# Patient Record
Sex: Female | Born: 1935 | Race: White | Hispanic: No | Smoking: Never smoker
Health system: Southern US, Community
[De-identification: ages and names within clinical notes are randomized; demographics above are authoritative.]

## PROBLEM LIST (undated history)

## (undated) DIAGNOSIS — Z87442 Personal history of urinary calculi: Secondary | ICD-10-CM

## (undated) DIAGNOSIS — N3000 Acute cystitis without hematuria: Secondary | ICD-10-CM

## (undated) DIAGNOSIS — H409 Unspecified glaucoma: Secondary | ICD-10-CM

## (undated) DIAGNOSIS — Z9889 Other specified postprocedural states: Secondary | ICD-10-CM

## (undated) DIAGNOSIS — R0600 Dyspnea, unspecified: Secondary | ICD-10-CM

## (undated) DIAGNOSIS — I499 Cardiac arrhythmia, unspecified: Secondary | ICD-10-CM

## (undated) DIAGNOSIS — C801 Malignant (primary) neoplasm, unspecified: Secondary | ICD-10-CM

## (undated) DIAGNOSIS — R2681 Unsteadiness on feet: Secondary | ICD-10-CM

## (undated) DIAGNOSIS — F32A Depression, unspecified: Secondary | ICD-10-CM

## (undated) DIAGNOSIS — I10 Essential (primary) hypertension: Secondary | ICD-10-CM

## (undated) DIAGNOSIS — Q6211 Congenital occlusion of ureteropelvic junction: Secondary | ICD-10-CM

## (undated) DIAGNOSIS — N393 Stress incontinence (female) (male): Secondary | ICD-10-CM

## (undated) DIAGNOSIS — H3530 Unspecified macular degeneration: Secondary | ICD-10-CM

## (undated) DIAGNOSIS — N390 Urinary tract infection, site not specified: Secondary | ICD-10-CM

## (undated) DIAGNOSIS — Q6239 Other obstructive defects of renal pelvis and ureter: Secondary | ICD-10-CM

## (undated) DIAGNOSIS — N1330 Unspecified hydronephrosis: Secondary | ICD-10-CM

## (undated) DIAGNOSIS — R339 Retention of urine, unspecified: Secondary | ICD-10-CM

## (undated) DIAGNOSIS — R42 Dizziness and giddiness: Secondary | ICD-10-CM

## (undated) DIAGNOSIS — F419 Anxiety disorder, unspecified: Secondary | ICD-10-CM

## (undated) DIAGNOSIS — N281 Cyst of kidney, acquired: Secondary | ICD-10-CM

## (undated) DIAGNOSIS — R112 Nausea with vomiting, unspecified: Secondary | ICD-10-CM

## (undated) HISTORY — PX: EYE SURGERY: SHX253

## (undated) HISTORY — PX: OTHER SURGICAL HISTORY: SHX169

## (undated) HISTORY — DX: Urinary tract infection, site not specified: N39.0

## (undated) HISTORY — PX: ABDOMINAL HYSTERECTOMY: SHX81

## (undated) HISTORY — DX: Acute cystitis without hematuria: N30.00

## (undated) HISTORY — DX: Essential (primary) hypertension: I10

## (undated) HISTORY — DX: Cyst of kidney, acquired: N28.1

## (undated) HISTORY — DX: Congenital occlusion of ureteropelvic junction: Q62.11

## (undated) HISTORY — DX: Anxiety disorder, unspecified: F41.9

## (undated) HISTORY — DX: Stress incontinence (female) (male): N39.3

## (undated) HISTORY — DX: Unspecified hydronephrosis: N13.30

## (undated) HISTORY — DX: Unspecified glaucoma: H40.9

## (undated) HISTORY — DX: Other obstructive defects of renal pelvis and ureter: Q62.39

## (undated) HISTORY — DX: Retention of urine, unspecified: R33.9

---

## 2005-02-15 ENCOUNTER — Emergency Department: Payer: Self-pay | Admitting: Emergency Medicine

## 2005-04-17 ENCOUNTER — Ambulatory Visit: Payer: Self-pay | Admitting: Unknown Physician Specialty

## 2005-05-29 ENCOUNTER — Ambulatory Visit: Payer: Self-pay | Admitting: Family Medicine

## 2005-09-24 ENCOUNTER — Ambulatory Visit: Payer: Self-pay | Admitting: Family Medicine

## 2005-09-26 ENCOUNTER — Other Ambulatory Visit: Payer: Self-pay

## 2005-09-26 ENCOUNTER — Inpatient Hospital Stay: Payer: Self-pay | Admitting: Internal Medicine

## 2006-06-17 ENCOUNTER — Ambulatory Visit: Payer: Self-pay | Admitting: Family Medicine

## 2006-07-01 ENCOUNTER — Ambulatory Visit: Payer: Self-pay | Admitting: Urology

## 2007-06-23 ENCOUNTER — Ambulatory Visit: Payer: Self-pay | Admitting: Family Medicine

## 2007-08-20 ENCOUNTER — Ambulatory Visit: Payer: Self-pay | Admitting: Urology

## 2008-04-21 ENCOUNTER — Ambulatory Visit: Payer: Self-pay | Admitting: General Surgery

## 2008-04-21 ENCOUNTER — Other Ambulatory Visit: Payer: Self-pay

## 2008-05-04 HISTORY — PX: HERNIA REPAIR: SHX51

## 2008-05-10 ENCOUNTER — Ambulatory Visit: Payer: Self-pay | Admitting: General Surgery

## 2008-07-05 ENCOUNTER — Ambulatory Visit: Payer: Self-pay | Admitting: Family Medicine

## 2008-08-17 ENCOUNTER — Ambulatory Visit: Payer: Self-pay | Admitting: Urology

## 2009-07-12 ENCOUNTER — Inpatient Hospital Stay: Payer: Self-pay | Admitting: Internal Medicine

## 2009-08-02 ENCOUNTER — Ambulatory Visit: Payer: Self-pay | Admitting: Urology

## 2009-08-07 ENCOUNTER — Ambulatory Visit: Payer: Self-pay | Admitting: Urology

## 2009-08-10 ENCOUNTER — Ambulatory Visit: Payer: Self-pay | Admitting: Family Medicine

## 2009-08-22 ENCOUNTER — Ambulatory Visit: Payer: Self-pay | Admitting: Urology

## 2009-08-23 ENCOUNTER — Emergency Department: Payer: Self-pay | Admitting: Emergency Medicine

## 2009-09-20 ENCOUNTER — Ambulatory Visit: Payer: Self-pay | Admitting: Urology

## 2009-12-06 ENCOUNTER — Ambulatory Visit: Payer: Self-pay | Admitting: Urology

## 2009-12-19 ENCOUNTER — Ambulatory Visit: Payer: Self-pay | Admitting: Urology

## 2010-09-20 ENCOUNTER — Ambulatory Visit: Payer: Self-pay | Admitting: Family Medicine

## 2010-12-12 ENCOUNTER — Ambulatory Visit: Payer: Self-pay | Admitting: Urology

## 2011-01-01 ENCOUNTER — Ambulatory Visit: Payer: Self-pay | Admitting: Urology

## 2011-10-25 ENCOUNTER — Ambulatory Visit: Payer: Self-pay | Admitting: Family Medicine

## 2011-11-29 ENCOUNTER — Ambulatory Visit: Payer: Self-pay | Admitting: Urology

## 2011-12-10 ENCOUNTER — Ambulatory Visit: Payer: Self-pay | Admitting: Urology

## 2012-04-13 ENCOUNTER — Emergency Department: Payer: Self-pay | Admitting: Emergency Medicine

## 2012-04-13 LAB — COMPREHENSIVE METABOLIC PANEL
Alkaline Phosphatase: 111 U/L (ref 50–136)
Anion Gap: 8 (ref 7–16)
Bilirubin,Total: 0.4 mg/dL (ref 0.2–1.0)
Calcium, Total: 9.5 mg/dL (ref 8.5–10.1)
Chloride: 106 mmol/L (ref 98–107)
Co2: 26 mmol/L (ref 21–32)
Creatinine: 1.4 mg/dL — ABNORMAL HIGH (ref 0.60–1.30)
EGFR (Non-African Amer.): 34 — ABNORMAL LOW
Glucose: 91 mg/dL (ref 65–99)
Potassium: 5.2 mmol/L — ABNORMAL HIGH (ref 3.5–5.1)
SGOT(AST): 23 U/L (ref 15–37)
SGPT (ALT): 18 U/L
Sodium: 140 mmol/L (ref 136–145)
Total Protein: 7.2 g/dL (ref 6.4–8.2)

## 2012-04-13 LAB — CBC
HCT: 38 % (ref 35.0–47.0)
MCH: 32.5 pg (ref 26.0–34.0)
MCV: 97 fL (ref 80–100)
Platelet: 279 10*3/uL (ref 150–440)
RBC: 3.92 10*6/uL (ref 3.80–5.20)
RDW: 12.8 % (ref 11.5–14.5)
WBC: 10.6 10*3/uL (ref 3.6–11.0)

## 2012-04-13 LAB — URINALYSIS, COMPLETE
Bilirubin,UR: NEGATIVE
Glucose,UR: NEGATIVE mg/dL (ref 0–75)
Specific Gravity: 1.004 (ref 1.003–1.030)

## 2012-11-13 ENCOUNTER — Ambulatory Visit: Payer: Self-pay | Admitting: Family Medicine

## 2012-11-24 ENCOUNTER — Ambulatory Visit: Payer: Self-pay | Admitting: Urology

## 2012-11-24 LAB — BASIC METABOLIC PANEL
Anion Gap: 7 (ref 7–16)
BUN: 29 mg/dL — ABNORMAL HIGH (ref 7–18)
Calcium, Total: 9.3 mg/dL (ref 8.5–10.1)
Chloride: 104 mmol/L (ref 98–107)
Co2: 26 mmol/L (ref 21–32)
EGFR (African American): 42 — ABNORMAL LOW
Glucose: 96 mg/dL (ref 65–99)
Osmolality: 280 (ref 275–301)
Potassium: 4.8 mmol/L (ref 3.5–5.1)

## 2012-11-24 LAB — CBC WITH DIFFERENTIAL/PLATELET
Basophil %: 0.5 %
HGB: 13.1 g/dL (ref 12.0–16.0)
MCH: 32.5 pg (ref 26.0–34.0)
MCHC: 34.4 g/dL (ref 32.0–36.0)
Monocyte #: 0.5 x10 3/mm (ref 0.2–0.9)
Neutrophil %: 58.8 %
Platelet: 278 10*3/uL (ref 150–440)
RDW: 12.5 % (ref 11.5–14.5)

## 2012-12-01 ENCOUNTER — Ambulatory Visit: Payer: Self-pay | Admitting: Urology

## 2013-11-15 ENCOUNTER — Ambulatory Visit: Payer: Self-pay | Admitting: Family Medicine

## 2013-12-13 ENCOUNTER — Ambulatory Visit: Payer: Self-pay | Admitting: Urology

## 2014-01-12 ENCOUNTER — Ambulatory Visit: Payer: Self-pay | Admitting: Urology

## 2014-01-12 LAB — CBC WITH DIFFERENTIAL/PLATELET
BASOS ABS: 0 10*3/uL (ref 0.0–0.1)
Basophil %: 0.6 %
Eosinophil #: 0.1 10*3/uL (ref 0.0–0.7)
Eosinophil %: 2.1 %
HCT: 35.9 % (ref 35.0–47.0)
HGB: 12.3 g/dL (ref 12.0–16.0)
LYMPHS PCT: 25.8 %
Lymphocyte #: 1.3 10*3/uL (ref 1.0–3.6)
MCH: 33 pg (ref 26.0–34.0)
MCHC: 34.4 g/dL (ref 32.0–36.0)
MCV: 96 fL (ref 80–100)
Monocyte #: 0.5 x10 3/mm (ref 0.2–0.9)
Monocyte %: 10.3 %
NEUTROS ABS: 3.1 10*3/uL (ref 1.4–6.5)
Neutrophil %: 61.2 %
PLATELETS: 251 10*3/uL (ref 150–440)
RBC: 3.74 10*6/uL — ABNORMAL LOW (ref 3.80–5.20)
RDW: 13.2 % (ref 11.5–14.5)
WBC: 5 10*3/uL (ref 3.6–11.0)

## 2014-01-18 ENCOUNTER — Ambulatory Visit: Payer: Self-pay | Admitting: Urology

## 2014-09-20 ENCOUNTER — Ambulatory Visit: Payer: Self-pay | Admitting: Ophthalmology

## 2014-11-21 ENCOUNTER — Ambulatory Visit: Payer: Self-pay | Admitting: Family Medicine

## 2015-01-09 ENCOUNTER — Ambulatory Visit: Payer: Self-pay | Admitting: Urology

## 2015-01-11 ENCOUNTER — Ambulatory Visit: Payer: Self-pay | Admitting: Urology

## 2015-02-24 NOTE — Op Note (Signed)
PATIENT NAME:  April Green, April Green MR#:  [id number] DATE OF BIRTH:  June 19, 1923  DATE OF PROCEDURE:  12/01/2012  PRINCIPAL DIAGNOSIS: Congenital right ureteropelvic junction obstruction.   POSTOPERATIVE DIAGNOSIS: Congenital right ureteropelvic junction obstruction.   PROCEDURE: Cystoscopy, right double-J ureteral stent change.   SURGEON: Edrick Oh, M.D.   ANESTHESIA: Laryngeal mask airway anesthesia.   INDICATIONS: The patient is an 81 year old white female with a history of congenital right UPJ obstruction. She developed progressive obstruction and sepsis several years prior. She has been managed with full with stent placement since that time. She presents for routine stent change.   DESCRIPTION OF PROCEDURE: After informed consent was obtained, the patient was taken to the Operating Room and placed in the dorsal lithotomy position under laryngeal mask airway anesthesia. The patient was then prepped and draped in the usual standard fashion. The 28 French rigid cystoscope was introduced into the urethra under direct vision with no urethral abnormalities noted. Upon entering the bladder, the mucosa was inspected in its entirety. Mild inflammatory changes were noted on the right trigone and bladder base region. The stent was noted protruding from the right ureteral orifice. The left ureteral orifice demonstrated no significant abnormalities. No other significant mucosal abnormalities were appreciated. The right Resonance stent was grasped and removed without difficulty. A flexible tip Glidewire was introduced into the right ureteral orifice. It was advanced into the upper pole collecting system under fluoroscopic guidance without difficulty. The Resonance stent sheath was then placed over the guidewire. The obturator was removed. A new 6-French x 24 cm double-J Resonance stent was inserted through the sheath. The sheath was then withdrawn with adequate curl noted within the urinary bladder. The curl at the  kidney, however, was not satisfactory. It essentially had looped back on itself as the sheath was being removed. The decision was made to replace the stent in better position. The stent was grasped utilizing grasping forceps and removed. A flexible tip Glidewire was replaced back into the right renal collecting system under fluoroscopic guidance without difficulty. The sheath was replaced over the guidewire. The obturator was once again removed. The 6-French x 24 cm Resonance stent was then advanced through the sheath. Better curl was obtained within the renal pelvis. Adequate curl was also noted within the urinary bladder. The bladder was drained. The cystoscope was removed. The patient was returned to the supine position and awakened from laryngeal mask airway anesthesia. She was taken to the recovery room in stable condition. There were no problems or complications. The patient tolerated the procedure well.   ____________________________ Denice Bors. Jacqlyn Larsen, MD bsc:jm D: 12/01/2012 14:16:36 ET T: 12/01/2012 15:09:45 ET JOB#: [id number]  cc: Denice Bors. Jacqlyn Larsen, MD, <Dictator> Denice Bors Mikell Camp MD ELECTRONICALLY SIGNED 12/03/2012 9:35

## 2015-02-25 NOTE — Op Note (Signed)
PATIENT NAME:  April Green, April Green MR#:  [id number] DATE OF BIRTH:  Aug 25, 1923  DATE OF PROCEDURE:  01/18/2014  PRINCIPAL DIAGNOSIS: Congenital right ureteropelvic junction obstruction.   POSTOPERATIVE DIAGNOSIS: Congenital right ureteropelvic junction obstruction.   PROCEDURE: Cystoscopy and right double-J ureteral stent change.   SURGEON: Edrick Oh, M.D.   ANESTHESIA: Laryngeal mask airway anesthesia.   INDICATIONS: The patient is an 81 year old white female with a history of congenital UPJ obstruction. She developed significant worsening of obstruction and infection a number of years prior. She has been managed on routine stent change. She presents today for this purpose.   DESCRIPTION OF PROCEDURE: After informed consent was obtained, the patient was taken to the operating room and placed in the dorsal lithotomy position under laryngeal mask airway anesthesia. The patient was then prepped and draped in the usual standard fashion. Cystoscopy was performed with a 22-French rigid cystoscope. No significant urethral abnormalities were noted upon advancement of the scope. Upon entering the bladder, the mucosa was inspected in its entirety. No gross mucosal lesions were noted. No significant inflammatory changes were noted surrounding the ureteral orifice. Minimal biofilm deposition was noted on the stent with no evidence of encrustation. Grasping forceps were utilized and the stent was removed without difficulty. A flexible tip Glidewire was introduced into the right ureteral orifice and advanced into the upper pole collecting system under fluoroscopic guidance without difficulty. The sheath was then placed over the guidewire. A new 6-French x 24 cm double-J ureteral Resonance stent was advanced through the sheath into the renal collecting system under fluoroscopic guidance. The sheath was then removed also under visual and fluoroscopic guidance. Adequate curl was noted within the renal pelvis. Adequate  curl was also noted within the urinary bladder. The bladder was drained and the cystoscope was removed. The patient was returned to the supine position and awakened from laryngeal mask airway anesthesia. She was taken to the recovery room in stable condition. There were no problems or complications. The patient tolerated the procedure well. ____________________________ Denice Bors. Jacqlyn Larsen, MD bsc:aw D: 01/18/2014 08:00:37 ET T: 01/18/2014 11:34:24 ET JOB#: [id number]  cc: Denice Bors. Jacqlyn Larsen, MD, <Dictator> Denice Bors Embrie Mikkelsen MD ELECTRONICALLY SIGNED 01/19/2014 17:42

## 2015-02-26 NOTE — Op Note (Signed)
PATIENT NAME:  April Green, April Green MR#:  [id number] DATE OF BIRTH:  1923-03-17  DATE OF PROCEDURE:  12/10/2011  PREOPERATIVE DIAGNOSIS: Congenital right ureteropelvic junction obstruction, hydronephrosis.   POSTOPERATIVE DIAGNOSIS: Congenital right ureteropelvic junction obstruction, hydronephrosis.   PROCEDURE: Cystoscopy, right double-J ureteral stent change.   SURGEON: Edrick Oh, M.D.   ANESTHESIA: Laryngeal mask airway anesthesia.   INDICATIONS: The patient is an 80 year old white female with a history of congenital UPJ obstruction. She developed progressing renal function and subsequent infection. She has been on routine stent change since that time. She presents today for this purpose.   DESCRIPTION OF PROCEDURE: After informed consent was obtained, the patient was taken to the Operating Room and placed in the dorsal lithotomy position under laryngeal mask airway anesthesia. The patient was then prepped and draped in the usual standard fashion. The 38 French rigid cystoscope was introduced into the urethra under direct vision with no urethral abnormalities noted. Upon entering the bladder, the stent was easily identified. Minimal inflammation and irritation was noted on the bladder base just below the ureteral orifice. No other bladder lesions were identified. A flexible tip Glidewire was introduced into the right ureteral orifice alongside the Resonance stent. The guidewire was advanced into the renal pelvis without difficulty. The Resonance stent was grasped utilizing grasping forceps and removed under fluoroscopic guidance without difficulty. Care was taken to avoid dislodging the guidewire during removal. The scope was back-loaded over the guidewire. The sheath was advanced over the guidewire into the renal pelvis. The new 6 French x 24 cm double-J Resonance stent was advanced through the sheath. Adequate curl was noted within the renal pelvis. As the sheath was withdrawn adequate curl was also  noted within the urinary bladder. The bladder was then drained, the cystoscope was removed, and the patient was returned to the supine position and    awakened from laryngeal mask airway anesthesia. The patient was taken to the Recovery Room in stable condition. There were no problems or complications. The patient tolerated the procedure well. ____________________________ Denice Bors. Jacqlyn Larsen, MD bsc:slb D: 12/10/2011 14:54:16 ET T: 12/10/2011 15:29:27 ET JOB#: [id number]  cc: Denice Bors. Jacqlyn Larsen, MD, <Dictator> Denice Bors Clell Trahan MD ELECTRONICALLY SIGNED 12/19/2011 9:04

## 2015-03-05 NOTE — Op Note (Signed)
PATIENT NAME:  April Green, April Green MR#:  [id number] DATE OF BIRTH:  1923-04-13  DATE OF PROCEDURE:  01/11/2015  PREOPERATIVE DIAGNOSIS:  Congenital right ureteropelvic junction obstruction.   POSTOPERATIVE DIAGNOSIS:  Congenital right ureteropelvic junction obstruction.   PROCEDURE PERFORMED: Cystoscopy, right ureteral stent exchange.   ATTENDING SURGEON: Sherlynn Stalls, MD.   ANESTHESIA: General anesthesia.   DRAINS: A 6 x 24 French double-J metal Resonance ureteral stent.   COMPLICATIONS: None.   SPECIMENS: None.   INDICATION: This is a 81 year old female with a congenital right UPJ obstruction who has been managed for numerous years by Dr. Jacqlyn Larsen with annual right ureteral stent exchange. She presents today to have her right stent exchanged approximately 1 year after her previous stent was exchanged. Risks and benefits of the procedure were explained in detail to the patient who agreed to proceed as planned.   PROCEDURE: The patient was correctly identified in the preoperative holding area and informed consent was confirmed. She was brought to the operating suite and placed on the table in supine position. At this time universal timeout protocol was performed. All team members were identified. Venodyne boots were placed and the patient was administered IV ampicillin and gentamicin in the perioperative period. She was then placed under general anesthesia with mask LMA airway, repositioned lower on the bed in the dorsal spine position, and prepped and draped in  standard surgical fashion. At this point in time a rigid cystoscope using a 39 French access sheath was advanced per urethra into the bladder.  A metal Resonance stent was seen emanating from the right ureteral orifice. There was minimal bladder debris and no encrustation on the metal stent. The distal coil of the stent was grasped and the entire stent was removed without difficulty. A Sensor wire was then placed under cystoscopic guidance  through the right ureteral orifice up to the level of the renal pelvis without difficulty. The inner cannula of the Resonance stent was passed over the wire, past the level of the UPJ into the renal pelvis. The metal stent was then passed through this catheter up to the level of the renal pelvis until an adequate coil was seen in the renal pelvis.  A pusher was used to carefully withdraw the overlying cannula leaving the stent in stationary position. Once this was achieved an adequate coil could be seen both in the renal pelvis as well as in the bladder. Satisfactory positioning of the stent was achieved. The bladder was drained using the cystoscope. The patient was then repositioned in the supine position, reversed from anesthesia, and taken to the PACU in stable condition. There were no complications in this case.    ____________________________ Sherlynn Stalls, MD ajb:bu D: 01/11/2015 09:30:23 ET T: 01/11/2015 17:45:05 ET JOB#: [id number]  cc: Sherlynn Stalls, MD, <Dictator> Sherlynn Stalls MD ELECTRONICALLY SIGNED 02/08/2015 16:21

## 2015-09-11 ENCOUNTER — Other Ambulatory Visit: Payer: Self-pay | Admitting: Ophthalmology

## 2015-09-11 DIAGNOSIS — G453 Amaurosis fugax: Secondary | ICD-10-CM

## 2015-09-13 ENCOUNTER — Ambulatory Visit
Admission: RE | Admit: 2015-09-13 | Discharge: 2015-09-13 | Disposition: A | Discharge status: Home / Self Care | Payer: Medicare Other | Source: Ambulatory Visit | Attending: Ophthalmology | Admitting: Ophthalmology

## 2015-09-13 DIAGNOSIS — I6529 Occlusion and stenosis of unspecified carotid artery: Secondary | ICD-10-CM | POA: Insufficient documentation

## 2015-09-13 DIAGNOSIS — G453 Amaurosis fugax: Secondary | ICD-10-CM | POA: Insufficient documentation

## 2015-09-13 DIAGNOSIS — R509 Fever, unspecified: Secondary | ICD-10-CM | POA: Insufficient documentation

## 2015-10-18 ENCOUNTER — Encounter: Payer: Self-pay | Admitting: Urology

## 2015-10-19 ENCOUNTER — Ambulatory Visit (INDEPENDENT_AMBULATORY_CARE_PROVIDER_SITE_OTHER): Payer: Medicare Other | Admitting: Urology

## 2015-10-19 ENCOUNTER — Other Ambulatory Visit: Payer: Self-pay | Admitting: *Deleted

## 2015-10-19 ENCOUNTER — Encounter: Payer: Self-pay | Admitting: *Deleted

## 2015-10-19 ENCOUNTER — Ambulatory Visit
Admission: RE | Admit: 2015-10-19 | Discharge: 2015-10-19 | Disposition: A | Discharge status: Home / Self Care | Payer: Medicare Other | Source: Ambulatory Visit | Attending: Urology | Admitting: Urology

## 2015-10-19 VITALS — BP 136/68 | HR 70 | Ht 60.0 in | Wt 91.9 lb

## 2015-10-19 DIAGNOSIS — N390 Urinary tract infection, site not specified: Secondary | ICD-10-CM

## 2015-10-19 DIAGNOSIS — Q6211 Congenital occlusion of ureteropelvic junction: Secondary | ICD-10-CM

## 2015-10-19 DIAGNOSIS — Q6239 Other obstructive defects of renal pelvis and ureter: Secondary | ICD-10-CM

## 2015-10-19 LAB — URINALYSIS, COMPLETE
Bilirubin, UA: NEGATIVE
GLUCOSE, UA: NEGATIVE
KETONES UA: NEGATIVE
NITRITE UA: NEGATIVE
Protein, UA: NEGATIVE
SPEC GRAV UA: 1.01 (ref 1.005–1.030)
Urobilinogen, Ur: 0.2 mg/dL (ref 0.2–1.0)
pH, UA: 6 (ref 5.0–7.5)

## 2015-10-19 LAB — MICROSCOPIC EXAMINATION: WBC, UA: >30 /hpf — ABNORMAL HIGH (ref 0–?)

## 2015-10-19 MED ORDER — AMOXICILLIN-POT CLAVULANATE 875-125 MG PO TABS: 1.0000 | ORAL_TABLET | Freq: Two times a day (BID) | ORAL | Status: DC

## 2015-10-19 NOTE — Progress Notes (Signed)
10/19/2015 5:01 PM   Bertram Millard Tobie Poet April 21, 1923 [id number]  Referring provider: [name], MD [address] [facility] [place], [place] [address]  Chief Complaint  Patient presents with  . Urinary Tract Infection    Postive urine culture Dr. Kary Kos    HPI: Patient is a 81 year old white female who presents today at the request of her primary care physician Dr. [name] for a positive urine culture that was obtained on 10/16/2015. It is positive for Klebsiella.  She states that she presented for labs prior to her annual physical and she was not having any urinary symptoms at that time.  She also had positive urine culture in May 2016 for Escherichia coli.    She has not had any gross hematuria, dysuria, flank pain or suprapubic pain.  She has not noted any change in her urination status.  She does not suffer from incontinence.  She has not had any nausea or vomiting.  She states a few weeks ago she did have a slight temperature spike with associated chills.  This only lasted for 1 day.  She is currently not on any antibiotics.  She was on a course of amoxicillin 2 weeks ago for a URI.    She does have a history of a congenital right UPJ obstruction that is being managed with an indwelling stent. Her last ureteral stent exchange was on 01/11/2015 with Dr. [name].  She does not report any symptoms of stent discomfort.   PMH: Past Medical History  Diagnosis Date  . Anxiety   . Glaucoma   . Hypertension   . Incomplete bladder emptying   . Chronic UTI   . Acute cystitis   . Renal cyst   . Hydronephrosis   . UPJ obstruction, congenital   . SUI (stress urinary incontinence, female)     Surgical History: Past Surgical History  Procedure Laterality Date  . Cysto stent exchange      once a year for last 6 years  . Hernia repair  05/2008  . Abdominal hysterectomy      Home Medications:    Medication List       This list is accurate as of: 10/19/15  5:01 PM.   Always use your most recent med list.               acetaminophen 325 MG tablet  Commonly known as:  TYLENOL  Take by mouth.     amLODipine 10 MG tablet  Commonly known as:  NORVASC  Take by mouth.     amoxicillin 875 MG tablet  Commonly known as:  AMOXIL  Reported on 10/19/2015     amoxicillin-clavulanate 875-125 MG tablet  Commonly known as:  AUGMENTIN  Take 1 tablet by mouth every 12 (twelve) hours.     diazepam 2 MG tablet  Commonly known as:  VALIUM  Take by mouth.     latanoprost 0.005 % ophthalmic solution  Commonly known as:  XALATAN  Apply to eye.     LORazepam 0.5 MG tablet  Commonly known as:  ATIVAN  TAKE 1 TABLET BY MOUTH EVERY NIGHT AT BEDTIME     losartan 25 MG tablet  Commonly known as:  COZAAR  TAKE 1 TABLET BY MOUTH EVERY DAY     metoprolol succinate 25 MG 24 hr tablet  Commonly known as:  TOPROL-XL  Take 25 mg by mouth.     PRESERVISION AREDS PO  Take by mouth.     triamcinolone  cream 0.1 %  Commonly known as:  KENALOG  Apply topically.        Allergies:  Allergies  Allergen Reactions  . 5-Methoxypsoralen Other (See Comments)    Altered mental status  . Aspirin Other (See Comments)  . Azithromycin Nausea Only and Other (See Comments)    Confusion  . Ceftriaxone     Other reaction(s): NAUSEA  . Cyclobenzaprine Nausea Only  . Diphenhydramine Nausea Only and Other (See Comments)    Confusion  . Levofloxacin Other (See Comments)  . Oxycodone     Other reaction(s): OTHER  . Sulfa Antibiotics Other (See Comments)    Other reaction(s): ANAPHYLAXIS  . Menthol Nausea Only    Family History: Family History  Problem Relation Age of Onset  . Kidney disease Neg Hx   . Bladder Cancer Neg Hx     Social History:  reports that she has never smoked. She does not have any smokeless tobacco history on file. She reports that she does not drink alcohol or use illicit drugs.  ROS: UROLOGY Frequent Urination?: No Hard to postpone  urination?: No Burning/pain with urination?: No Get up at night to urinate?: No Leakage of urine?: Yes Urine stream starts and stops?: No Trouble starting stream?: No Do you have to strain to urinate?: No Blood in urine?: No Urinary tract infection?: Yes Sexually transmitted disease?: No Injury to kidneys or bladder?: No Painful intercourse?: No Weak stream?: No Currently pregnant?: No Vaginal bleeding?: No Last menstrual period?: n  Gastrointestinal Nausea?: No Vomiting?: No Indigestion/heartburn?: Yes Diarrhea?: No Constipation?: Yes  Constitutional Fever: No Night sweats?: No Weight loss?: Yes Fatigue?: Yes  Skin Skin rash/lesions?: No Itching?: Yes  Eyes Blurred vision?: No Double vision?: No  Ears/Nose/Throat Sore throat?: No Sinus problems?: No  Hematologic/Lymphatic Swollen glands?: No Easy bruising?: Yes  Cardiovascular Leg swelling?: No Chest pain?: No  Respiratory Cough?: No Shortness of breath?: No  Endocrine Excessive thirst?: No  Musculoskeletal Back pain?: Yes Joint pain?: No  Neurological Headaches?: No Dizziness?: No  Psychologic Depression?: No Anxiety?: Yes  Physical Exam: BP 136/68 mmHg  Pulse 70  Ht 5' (1.524 m)  Wt 91 lb 14.4 oz (41.686 kg)  BMI 17.95 kg/m2  Constitutional: Well nourished. Alert and oriented, No acute distress. HEENT: [place] AT, moist mucus membranes. Trachea midline, no masses. Cardiovascular: No clubbing, cyanosis, or edema. Respiratory: Normal respiratory effort, no increased work of breathing. GI: Abdomen is soft, non tender, non distended, no abdominal masses. Liver and spleen not palpable.  No hernias appreciated.  Stool sample for occult testing is not indicated.   GU: No CVA tenderness.  No bladder fullness or masses.   Skin: No rashes, bruises or suspicious lesions. Lymph: No cervical or inguinal adenopathy. Neurologic: Grossly intact, no focal deficits, moving all 4  extremities. Psychiatric: Normal mood and affect.  Laboratory Data: Lab Results  Component Value Date   WBC 5.0 01/12/2014   HGB 12.3 01/12/2014   HCT 35.9 01/12/2014   MCV 96 01/12/2014   PLT 251 01/12/2014    Lab Results  Component Value Date   CREATININE 1.31* 11/24/2012     Urinalysis Results for orders placed or performed in visit on 10/19/15  Microscopic Examination  Result Value Ref Range   WBC, UA >30 (H) 0 -  5 /hpf   RBC, UA 0-2 0 -  2 /hpf   Epithelial Cells (non renal) 0-10 0 - 10 /hpf   Mucus, UA Present (A) Not Estab.  Bacteria, UA Many (A) None seen/Few  Urinalysis, Complete  Result Value Ref Range   Specific Gravity, UA 1.010 1.005 - 1.030   pH, UA 6.0 5.0 - 7.5   Color, UA Yellow Yellow   Appearance Ur Cloudy (A) Clear   Leukocytes, UA 3+ (A) Negative   Protein, UA Negative Negative/Trace   Glucose, UA Negative Negative   Ketones, UA Negative Negative   RBC, UA Trace (A) Negative   Bilirubin, UA Negative Negative   Urobilinogen, Ur 0.2 0.2 - 1.0 mg/dL   Nitrite, UA Negative Negative   Microscopic Examination See below:     Pertinent Imaging: CLINICAL DATA: Urinary tract infection. Right ureteral stent for urolithiasis.  EXAM: ABDOMEN - 1 VIEW  COMPARISON: 12/13/2013  FINDINGS: No evidence of dilated bowel loops. Right ureteral stent is seen in place. Several pelvic phleboliths again noted. No definite radiopaque urinary calculi identified. Surgical mesh again seen within the left inguinal region. Old L3 vertebral body compression fracture deformity again noted.  IMPRESSION: Right ureteral stent. No acute findings identified.   Electronically Signed  By: Earle Gell M.D.  On: 10/19/2015 14:52   Assessment & Plan:    1. UTI:   Urine culture from Dr. Barbarann Ehlers office was positive for Klebsiella. I have started the patient on Augmentin 875/125 twice daily for 7 days which the organism is sensitive.  She will return in 2  weeks for follow-up UA and symptom recheck.  2. Congenital right UPJ obstruction:   This is managed with an indwelling ureteral stent that is changed annually. Her last exchange was on 01/11/2015. KUB demonstrates stent is in position.  Return in about 2 weeks (around 11/02/2015) for UA and office visit.  Zara Council, [facility] [address] [place], [place] [address] [phone]

## 2015-10-20 ENCOUNTER — Telehealth: Payer: Self-pay

## 2015-10-20 NOTE — Telephone Encounter (Signed)
-----   Message from Nori Riis, PA-C sent at 10/19/2015  4:38 PM EST ----- Please tell the patient that her stent is in place and we will see her in two weeks.

## 2015-10-20 NOTE — Telephone Encounter (Signed)
Spoke with pt caregiver in reference to stent placement. Caregiver voiced understanding.

## 2015-10-21 ENCOUNTER — Telehealth: Payer: Self-pay | Admitting: Urology

## 2015-10-21 MED ORDER — CIPROFLOXACIN HCL 500 MG PO TABS: 500.0000 mg | ORAL_TABLET | Freq: Two times a day (BID) | ORAL | Status: DC

## 2015-10-21 NOTE — Telephone Encounter (Signed)
Called by answering service, patient unable to tolerate augmentin due to GI distress (nausea, loose stool).  Has tolerated cipro in the past.  Ucx reviewed, sensitive to cipro.    Called to make patient aware of abx change.    Hollice Espy, MD

## 2015-11-07 ENCOUNTER — Encounter: Payer: Self-pay | Admitting: Urology

## 2015-11-07 ENCOUNTER — Ambulatory Visit (INDEPENDENT_AMBULATORY_CARE_PROVIDER_SITE_OTHER): Payer: Medicare Other | Admitting: Urology

## 2015-11-07 VITALS — BP 131/66 | HR 76 | Ht 60.0 in | Wt 93.2 lb

## 2015-11-07 DIAGNOSIS — N390 Urinary tract infection, site not specified: Secondary | ICD-10-CM

## 2015-11-07 DIAGNOSIS — Q6211 Congenital occlusion of ureteropelvic junction: Secondary | ICD-10-CM | POA: Diagnosis not present

## 2015-11-07 DIAGNOSIS — N952 Postmenopausal atrophic vaginitis: Secondary | ICD-10-CM | POA: Insufficient documentation

## 2015-11-07 DIAGNOSIS — Q6239 Other obstructive defects of renal pelvis and ureter: Secondary | ICD-10-CM

## 2015-11-07 LAB — URINALYSIS, COMPLETE
BILIRUBIN UA: NEGATIVE
GLUCOSE, UA: NEGATIVE
KETONES UA: NEGATIVE
NITRITE UA: NEGATIVE
SPEC GRAV UA: 1.01 (ref 1.005–1.030)
Urobilinogen, Ur: 0.2 mg/dL (ref 0.2–1.0)
pH, UA: 6 (ref 5.0–7.5)

## 2015-11-07 LAB — MICROSCOPIC EXAMINATION
RBC, UA: NONE SEEN /hpf (ref 0–?)
WBC, UA: >30 /hpf — ABNORMAL HIGH (ref 0–?)

## 2015-11-07 NOTE — Progress Notes (Signed)
12:20 PM   April Green 1923-10-02 [id number]  Referring provider: [name], MD [address] [facility] [place], [place] [address]  Chief Complaint  Patient presents with  . Urinary Tract Infection    follow up    HPI: Patient is a 82 year old Caucasian female who was found to have an UTI and was placed on antibiotics.  She is near to completing her antibiotics and she presents today for UA recheck.    Previous history Patient is a 82 year old white female who presents today at the request of her primary care physician Dr. [name] for a positive urine culture that was obtained on 10/16/2015.  It is positive for Klebsiella.  She states that she presented for labs prior to her annual physical and she was not having any urinary symptoms at that time.  She also had positive urine culture in May 2016 for Escherichia coli.  She had an episode of chills and fever for one day, so she was started on Augmentin at her last visit.  She developed some GI upset with the Augmentin, so she was switched to Cipro.    She still has not had any gross hematuria, dysuria, flank pain or suprapubic pain.  She also has not noted any change in her urination status.  She does not suffer from incontinence.  She has not had any nausea or vomiting.  Her UA at this time is suspicious for infection.   She does have a history of a congenital right UPJ obstruction that is being managed with an indwelling stent. Her last ureteral stent exchange was on 01/11/2015 with Dr. [name].  She does not report any symptoms of stent discomfort.   PMH: Past Medical History  Diagnosis Date  . Anxiety   . Glaucoma   . Hypertension   . Incomplete bladder emptying   . Chronic UTI   . Acute cystitis   . Renal cyst   . Hydronephrosis   . UPJ obstruction, congenital   . SUI (stress urinary incontinence, female)     Surgical History: Past Surgical History  Procedure Laterality Date  . Cysto stent exchange       once a year for last 6 years  . Hernia repair  05/2008  . Abdominal hysterectomy      Home Medications:    Medication List       This list is accurate as of: 11/07/15 12:20 PM.  Always use your most recent med list.               acetaminophen 325 MG tablet  Commonly known as:  TYLENOL  Take by mouth.     amLODipine 10 MG tablet  Commonly known as:  NORVASC  Take by mouth.     amoxicillin 875 MG tablet  Commonly known as:  AMOXIL  Reported on 11/07/2015     amoxicillin-clavulanate 875-125 MG tablet  Commonly known as:  AUGMENTIN  Take 1 tablet by mouth every 12 (twelve) hours.     ciprofloxacin 500 MG tablet  Commonly known as:  CIPRO  Take 1 tablet (500 mg total) by mouth every 12 (twelve) hours.     ciprofloxacin 500 MG/5ML (10%) suspension  Commonly known as:  CIPRO  Take by mouth. Reported on 11/07/2015     diazepam 2 MG tablet  Commonly known as:  VALIUM  Take by mouth.     latanoprost 0.005 % ophthalmic solution  Commonly known as:  Ivin Poot  Apply to eye.     LORazepam 0.5 MG tablet  Commonly known as:  ATIVAN  TAKE 1 TABLET BY MOUTH EVERY NIGHT AT BEDTIME     losartan 25 MG tablet  Commonly known as:  COZAAR  TAKE 1 TABLET BY MOUTH EVERY DAY     metoprolol succinate 25 MG 24 hr tablet  Commonly known as:  TOPROL-XL  Take 25 mg by mouth.     PRESERVISION AREDS PO  Take by mouth.     triamcinolone cream 0.1 %  Commonly known as:  KENALOG  Apply topically. Reported on 11/07/2015        Allergies:  Allergies  Allergen Reactions  . 5-Methoxypsoralen Other (See Comments)    Altered mental status  . Amoxicillin-Pot Clavulanate Diarrhea and Nausea Only  . Aspirin Other (See Comments)  . Azithromycin Nausea Only and Other (See Comments)    Confusion  . Ceftriaxone     Other reaction(s): NAUSEA  . Cyclobenzaprine Nausea Only  . Diphenhydramine Nausea Only and Other (See Comments)    Confusion  . Levofloxacin Other (See Comments)  .  Oxycodone     Other reaction(s): OTHER  . Sulfa Antibiotics Other (See Comments)    Other reaction(s): ANAPHYLAXIS  . Menthol Nausea Only    Family History: Family History  Problem Relation Age of Onset  . Kidney disease Neg Hx   . Bladder Cancer Neg Hx     Social History:  reports that she has never smoked. She does not have any smokeless tobacco history on file. She reports that she does not drink alcohol or use illicit drugs.  ROS: UROLOGY Frequent Urination?: No Hard to postpone urination?: No Burning/pain with urination?: No Get up at night to urinate?: No Leakage of urine?: No Urine stream starts and stops?: No Trouble starting stream?: No Do you have to strain to urinate?: No Blood in urine?: No Urinary tract infection?: No Sexually transmitted disease?: No Injury to kidneys or bladder?: No Painful intercourse?: No Weak stream?: No Currently pregnant?: No Vaginal bleeding?: No Last menstrual period?: n  Gastrointestinal Nausea?: No Vomiting?: No Indigestion/heartburn?: No Diarrhea?: No Constipation?: No  Constitutional Fever: No Night sweats?: No Weight loss?: No Fatigue?: No  Skin Skin rash/lesions?: No Itching?: No  Eyes Blurred vision?: No Double vision?: No  Ears/Nose/Throat Sore throat?: No Sinus problems?: No  Hematologic/Lymphatic Swollen glands?: No Easy bruising?: No  Cardiovascular Leg swelling?: No Chest pain?: No  Respiratory Cough?: No Shortness of breath?: No  Endocrine Excessive thirst?: No  Musculoskeletal Back pain?: No Joint pain?: No  Neurological Headaches?: No Dizziness?: No  Psychologic Depression?: No Anxiety?: No  Physical Exam: BP 131/66 mmHg  Pulse 76  Ht 5' (1.524 m)  Wt 93 lb 3.2 oz (42.275 kg)  BMI 18.20 kg/m2  Constitutional: Well nourished. Alert and oriented, No acute distress. HEENT: [place] AT, moist mucus membranes. Trachea midline, no masses. Cardiovascular: No clubbing, cyanosis,  or edema. Respiratory: Normal respiratory effort, no increased work of breathing. GI: Abdomen is soft, non tender, non distended, no abdominal masses. Liver and spleen not palpable.  No hernias appreciated.  Stool sample for occult testing is not indicated.   GU: No CVA tenderness.  No bladder fullness or masses.  Atrophic vaginitis.  Introitus is stenosed.  I could only place my index finger in the vaginal vault. Skin: No rashes, bruises or suspicious lesions. Lymph: No cervical or inguinal adenopathy. Neurologic: Grossly intact, no focal deficits, moving all 4 extremities. Psychiatric: Normal  mood and affect.  Laboratory Data: Urinalysis Results for orders placed or performed in visit on 11/07/15  Microscopic Examination  Result Value Ref Range   WBC, UA >30 (H) 0 -  5 /hpf   RBC, UA None seen 0 -  2 /hpf   Epithelial Cells (non renal) 0-10 0 - 10 /hpf   Bacteria, UA Many (A) None seen/Few  Urinalysis, Complete  Result Value Ref Range   Specific Gravity, UA 1.010 1.005 - 1.030   pH, UA 6.0 5.0 - 7.5   Color, UA Yellow Yellow   Appearance Ur Cloudy (A) Clear   Leukocytes, UA 3+ (A) Negative   Protein, UA 1+ (A) Negative/Trace   Glucose, UA Negative Negative   Ketones, UA Negative Negative   RBC, UA 2+ (A) Negative   Bilirubin, UA Negative Negative   Urobilinogen, Ur 0.2 0.2 - 1.0 mg/dL   Nitrite, UA Negative Negative   Microscopic Examination See below:     Procedure In and Out Catheterization  Patient is present today for a I & O catheterization due to recurrent UTI's. Patient was cleaned and prepped in a sterile fashion with betadine and Lidocaine 2% jelly was instilled into the urethra.  A 14 FR cath was inserted no complications were noted , 30 ml of urine return was noted, urine was cloudy, yellow in color. A clean urine sample was collected for culture. Bladder was drained  And catheter was removed with out difficulty.    Preformed by: Zara Council,  PA-C  Assessment & Plan:    1. Recurrent UTI:   Urine culture from Dr. Barbarann Ehlers office was positive for Klebsiella on 10/16/2015.  She developed GI symptoms with the Augmentin, so she was switched to Cipro.  She tolerated the Cipro.  Her UA is suspicious for infection, so I will send it for culture.  I will not start an antibiotic at this time until the culture results are available.  Patient's meatus is retracted due to the atrophy of the vagina.  I am doubtful she can obtain a clean catch specimen and I have reservations that my CATH sample would be sterile.   She will be RTC in one month for exam and hopefully there will be some improvement with the atrophy and I can obtain a sterile CATH sample.    2. Atrophic vaginitis:    Patient was given a sample of vaginal estrogen cream (Premarin) and instructed to apply 0.5mg  (pea-sized amount)  just inside the vaginal introitus with a finger-tip every night for two weeks and then Monday, Wednesday and Friday nights.  I explained to the patient that vaginally administered estrogen, which causes only a slight increase in the blood estrogen levels, have fewer contraindications and adverse systemic effects that oral HT.  She will RTC in one month for exam.    2. Congenital right UPJ obstruction:   This is managed with an indwelling ureteral stent that is changed annually.  Her last exchange was on 01/11/2015. KUB demonstrates stent is in position.  Return in about 1 month (around 12/08/2015) for exam and UA.  Zara Council, [facility] [address], [place], [place] [address] [phone]

## 2015-11-09 LAB — CULTURE, URINE COMPREHENSIVE

## 2015-11-16 ENCOUNTER — Telehealth: Payer: Self-pay

## 2015-11-16 NOTE — Telephone Encounter (Signed)
LMOM

## 2015-11-16 NOTE — Telephone Encounter (Signed)
-----   Message from Nori Riis, PA-C sent at 11/16/2015  1:33 PM EST ----- I spoke with Dr. [name] and we do not need to treat the UTI at this time.  She will need a follow up appointment in March 2017 for a pre-op appointment for her stent exchange.

## 2015-11-17 NOTE — Telephone Encounter (Signed)
Spoke with pt niece, Pamala Hurry, and made aware of not treating UTI at this time. Niece voiced understanding.

## 2015-12-07 ENCOUNTER — Other Ambulatory Visit: Payer: Self-pay | Admitting: Family Medicine

## 2015-12-07 DIAGNOSIS — Z1231 Encounter for screening mammogram for malignant neoplasm of breast: Secondary | ICD-10-CM

## 2015-12-11 ENCOUNTER — Encounter: Payer: Self-pay | Admitting: Urology

## 2015-12-11 ENCOUNTER — Ambulatory Visit (INDEPENDENT_AMBULATORY_CARE_PROVIDER_SITE_OTHER): Payer: Medicare Other | Admitting: Urology

## 2015-12-11 VITALS — BP 130/68 | HR 86 | Ht 60.0 in | Wt 90.9 lb

## 2015-12-11 DIAGNOSIS — N390 Urinary tract infection, site not specified: Secondary | ICD-10-CM

## 2015-12-11 DIAGNOSIS — Q6211 Congenital occlusion of ureteropelvic junction: Secondary | ICD-10-CM

## 2015-12-11 DIAGNOSIS — N952 Postmenopausal atrophic vaginitis: Secondary | ICD-10-CM | POA: Diagnosis not present

## 2015-12-11 DIAGNOSIS — Q6239 Other obstructive defects of renal pelvis and ureter: Secondary | ICD-10-CM

## 2015-12-11 LAB — URINALYSIS, COMPLETE
BILIRUBIN UA: NEGATIVE
Glucose, UA: NEGATIVE
Ketones, UA: NEGATIVE
NITRITE UA: NEGATIVE
PH UA: 7 (ref 5.0–7.5)
Specific Gravity, UA: 1.01 (ref 1.005–1.030)
UUROB: 0.2 mg/dL (ref 0.2–1.0)

## 2015-12-11 LAB — MICROSCOPIC EXAMINATION

## 2015-12-11 MED ORDER — ESTRADIOL 0.1 MG/GM VA CREA: TOPICAL_CREAM | VAGINAL | Status: DC

## 2015-12-11 NOTE — Progress Notes (Signed)
1:24 PM   April Green 09/04/1923 [id number]  Referring provider: [name], MD [address] [facility] [place], [place] [address]  Chief Complaint  Patient presents with  . Recurrent UTI    follow up one month  . Vaginitis    HPI: Patient is a 82 year old Caucasian female who has an indwelling right ureteral stent due to congenital UPJ obstruction and atrophic vaginitis who presents today for follow-up exam.  Previous history Patient is a 82 year old white female who presents today at the request of her primary care physician Dr. [name] for a positive urine culture that was obtained on 10/16/2015.  It is positive for Klebsiella.  She states that she presented for labs prior to her annual physical and she was not having any urinary symptoms at that time.  She also had positive urine culture in May 2016 for Escherichia coli.  She had an episode of chills and fever for one day, so she was started on Augmentin at her last visit.  She developed some GI upset with the Augmentin, so she was switched to Cipro.  She continues to have positive urine cultures without urinary tract symptoms. I suspect she may be colonized.  She still has not had any gross hematuria, dysuria, flank pain or suprapubic pain.  She also has not noted any change in her urination status.  She does not suffer from incontinence.  She has not had any nausea or vomiting.    She does have a history of a congenital right UPJ obstruction that is being managed with an indwelling stent. Her last ureteral stent exchange was on 01/11/2015 with Dr. [name].  She does not report any symptoms of stent discomfort.  She was started on vaginal estrogen cream one month ago for atrophic vaginitis. She has been using the cream and not experiencing rash or vaginal irritation.  She is not experiencing gross hematuria, dysuria or suprapubic or flank pain. She denies fevers, chills, nausea or vomiting.   PMH: Past Medical  History  Diagnosis Date  . Anxiety   . Glaucoma   . Hypertension   . Incomplete bladder emptying   . Chronic UTI   . Acute cystitis   . Renal cyst   . Hydronephrosis   . UPJ obstruction, congenital   . SUI (stress urinary incontinence, female)     Surgical History: Past Surgical History  Procedure Laterality Date  . Cysto stent exchange      once a year for last 6 years  . Hernia repair  05/2008  . Abdominal hysterectomy      Home Medications:    Medication List       This list is accurate as of: 12/11/15  1:24 PM.  Always use your most recent med list.               acetaminophen 325 MG tablet  Commonly known as:  TYLENOL  Take by mouth.     amLODipine 10 MG tablet  Commonly known as:  NORVASC  Take by mouth.     amoxicillin 875 MG tablet  Commonly known as:  AMOXIL  Reported on 12/11/2015     amoxicillin-clavulanate 875-125 MG tablet  Commonly known as:  AUGMENTIN  Take 1 tablet by mouth every 12 (twelve) hours.     ciprofloxacin 500 MG tablet  Commonly known as:  CIPRO  Take 1 tablet (500 mg total) by mouth every 12 (twelve) hours.     ciprofloxacin  500 MG/5ML (10%) suspension  Commonly known as:  CIPRO  Take by mouth. Reported on 12/11/2015     diazepam 2 MG tablet  Commonly known as:  VALIUM  Take by mouth.     estradiol 0.1 MG/GM vaginal cream  Commonly known as:  ESTRACE VAGINAL  Apply 0.5mg  (pea-sized amount)  just inside the vaginal introitus with a finger-tip every night for two weeks and then Monday, Wednesday and Friday nights.     estrogens (conjugated) 0.625 MG tablet  Commonly known as:  PREMARIN  Take 0.625 mg by mouth daily. Monday, Wednesday and Friday     latanoprost 0.005 % ophthalmic solution  Commonly known as:  XALATAN  Apply to eye.     LORazepam 0.5 MG tablet  Commonly known as:  ATIVAN  TAKE 1 TABLET BY MOUTH EVERY NIGHT AT BEDTIME     losartan 25 MG tablet  Commonly known as:  COZAAR  TAKE 1 TABLET BY MOUTH EVERY DAY      metoprolol succinate 25 MG 24 hr tablet  Commonly known as:  TOPROL-XL  Take 25 mg by mouth.     PRESERVISION AREDS PO  Take by mouth.     triamcinolone cream 0.1 %  Commonly known as:  KENALOG  Apply topically. Reported on 11/07/2015        Allergies:  Allergies  Allergen Reactions  . 5-Methoxypsoralen Other (See Comments)    Altered mental status  . Amoxicillin-Pot Clavulanate Diarrhea and Nausea Only  . Aspirin Other (See Comments)  . Azithromycin Nausea Only and Other (See Comments)    Confusion  . Ceftriaxone     Other reaction(s): NAUSEA  . Cyclobenzaprine Nausea Only  . Diphenhydramine Nausea Only and Other (See Comments)    Confusion  . Levofloxacin Other (See Comments)  . Oxycodone     Other reaction(s): OTHER  . Sulfa Antibiotics Other (See Comments)    Other reaction(s): ANAPHYLAXIS  . Menthol Nausea Only    Family History: Family History  Problem Relation Age of Onset  . Kidney disease Neg Hx   . Bladder Cancer Neg Hx     Social History:  reports that she has never smoked. She does not have any smokeless tobacco history on file. She reports that she does not drink alcohol or use illicit drugs.  ROS: UROLOGY Frequent Urination?: No Hard to postpone urination?: No Burning/pain with urination?: No Get up at night to urinate?: No Leakage of urine?: No Urine stream starts and stops?: No Trouble starting stream?: No Do you have to strain to urinate?: No Blood in urine?: No Urinary tract infection?: No Sexually transmitted disease?: No Injury to kidneys or bladder?: No Painful intercourse?: No Weak stream?: No Currently pregnant?: No Vaginal bleeding?: No Last menstrual period?: n  Gastrointestinal Nausea?: No Vomiting?: No Indigestion/heartburn?: No Diarrhea?: No Constipation?: No  Constitutional Fever: No Night sweats?: No Weight loss?: No Fatigue?: No  Skin Skin rash/lesions?: No Itching?: No  Eyes Blurred vision?:  No Double vision?: No  Ears/Nose/Throat Sore throat?: No Sinus problems?: No  Hematologic/Lymphatic Swollen glands?: No Easy bruising?: No  Cardiovascular Leg swelling?: No Chest pain?: No  Respiratory Cough?: No Shortness of breath?: No  Endocrine Excessive thirst?: No  Musculoskeletal Back pain?: No Joint pain?: No  Neurological Headaches?: No Dizziness?: No  Psychologic Depression?: No Anxiety?: No  Physical Exam: BP 130/68 mmHg  Pulse 86  Ht 5' (1.524 m)  Wt 90 lb 14.4 oz (41.232 kg)  BMI 17.75 kg/m2  Constitutional: Well nourished. Alert and oriented, No acute distress. HEENT: [place] AT, moist mucus membranes. Trachea midline, no masses. Cardiovascular: No clubbing, cyanosis, or edema. Respiratory: Normal respiratory effort, no increased work of breathing. GI: Abdomen is soft, non tender, non distended, no abdominal masses. Liver and spleen not palpable.  No hernias appreciated.  Stool sample for occult testing is not indicated.   GU: No CVA tenderness.  No bladder fullness or masses.  Atrophic vaginitis.  Introitus is more pliable.   Skin: No rashes, bruises or suspicious lesions. Lymph: No cervical or inguinal adenopathy. Neurologic: Grossly intact, no focal deficits, moving all 4 extremities. Psychiatric: Normal mood and affect.  Laboratory Data: Urinalysis Results for orders placed or performed in visit on 12/11/15  Microscopic Examination  Result Value Ref Range   WBC, UA >30W 0 -  5 /hpf   RBC, UA 3-10 (A) 0 -  2 /hpf   Epithelial Cells (non renal) 0-10 0 - 10 /hpf   Bacteria, UA Many (A) None seen/Few  Urinalysis, Complete  Result Value Ref Range   Specific Gravity, UA 1.010 1.005 - 1.030   pH, UA 7.0 5.0 - 7.5   Color, UA Yellow Yellow   Appearance Ur Cloudy (A) Clear   Leukocytes, UA 3+ (A) Negative   Protein, UA Trace (A) Negative/Trace   Glucose, UA Negative Negative   Ketones, UA Negative Negative   RBC, UA 1+ (A) Negative    Bilirubin, UA Negative Negative   Urobilinogen, Ur 0.2 0.2 - 1.0 mg/dL   Nitrite, UA Negative Negative   Microscopic Examination See below:     Assessment & Plan:    1. Recurrent UTI:   Patient's introitus is still some else, but it is pliable.  She is not experiencing symptoms of urinary tract infection.  I will send it for culture so that we may prescribe a preoperative antibiotic since she is due for her stent exchange at this time.  2. Atrophic vaginitis:    Patient will continue her vaginal estrogen cream. I have given her a sample of Estrace cream.  I will also given her prescription for the cream.  2. Congenital right UPJ obstruction:   This is managed with an indwelling ureteral stent that is changed annually.  She is due for a ureteral stent exchange in March. She will need the metal ureteral stent that can remain long-term when the ureteral stent is exchanged.  She will be scheduled for ureteral stent exchange. She is aware of the risks as she had this procedure in the past.    Return for schedule ureteral stent exchange (metal stent).  Zara Council, [facility] [address], [place], [place] [address] [phone]

## 2015-12-12 ENCOUNTER — Telehealth: Payer: Self-pay | Admitting: Radiology

## 2015-12-12 ENCOUNTER — Other Ambulatory Visit: Payer: Self-pay | Admitting: Family Medicine

## 2015-12-12 DIAGNOSIS — N6310 Unspecified lump in the right breast, unspecified quadrant: Secondary | ICD-10-CM

## 2015-12-12 NOTE — Telephone Encounter (Signed)
Notified pt's POA of surgery scheduled 01/08/16, pre-admit testing appt on 2/23 @9 :45 and to call Friday prior to surgery for arrival time to SDS. POA voices understanding.

## 2015-12-13 ENCOUNTER — Ambulatory Visit: Payer: Medicare Other

## 2015-12-13 LAB — CULTURE, URINE COMPREHENSIVE

## 2015-12-14 ENCOUNTER — Telehealth: Payer: Self-pay | Admitting: Urology

## 2015-12-14 DIAGNOSIS — N390 Urinary tract infection, site not specified: Secondary | ICD-10-CM

## 2015-12-14 NOTE — Telephone Encounter (Signed)
What is her reaction to Augmentin?

## 2015-12-14 NOTE — Telephone Encounter (Signed)
April Green returned the call stating that the medication pt most recently took for a UTI she was able to tolerate. Nurse noted Dr. Cherrie Gauze note to say pt can not tolerated augmentin due to GI distress and cipro was given. Per April Green pt tolerated the cipro well.

## 2015-12-14 NOTE — Telephone Encounter (Signed)
Spoke with Bethena Roys, pt niece, in reference to pt reaction to augmentin. Bethena Roys stated she is currently on the road and will have to call back with information.

## 2015-12-15 ENCOUNTER — Other Ambulatory Visit: Payer: Self-pay

## 2015-12-15 MED ORDER — CIPROFLOXACIN HCL 250 MG PO TABS: ORAL_TABLET | ORAL | Status: DC

## 2015-12-15 NOTE — Telephone Encounter (Signed)
Spoke with Bethena Roys in reference to pt being treated for positive ucx. Made aware Dr. [name] stated pt should start cipro 250 bid 5 days prior to surgery. Bethena Roys voiced understanding. Made aware was going to go ahead and call medication in for them to pickup.

## 2015-12-18 ENCOUNTER — Ambulatory Visit
Admission: RE | Admit: 2015-12-18 | Discharge: 2015-12-18 | Disposition: A | Discharge status: Home / Self Care | Payer: Medicare Other | Source: Ambulatory Visit | Attending: Family Medicine | Admitting: Family Medicine

## 2015-12-18 ENCOUNTER — Telehealth: Payer: Self-pay

## 2015-12-18 DIAGNOSIS — N6310 Unspecified lump in the right breast, unspecified quadrant: Secondary | ICD-10-CM

## 2015-12-18 DIAGNOSIS — N63 Unspecified lump in breast: Secondary | ICD-10-CM | POA: Diagnosis present

## 2015-12-18 DIAGNOSIS — R928 Other abnormal and inconclusive findings on diagnostic imaging of breast: Secondary | ICD-10-CM | POA: Insufficient documentation

## 2015-12-18 NOTE — Telephone Encounter (Signed)
Yes. 5 days prior to surgery.

## 2015-12-18 NOTE — Telephone Encounter (Signed)
-----   Message from Nori Riis, PA-C sent at 12/17/2015  8:44 PM EST ----- Patient was placed on Cipro, correct?

## 2015-12-20 ENCOUNTER — Ambulatory Visit: Payer: Medicare Other

## 2015-12-28 ENCOUNTER — Encounter
Admission: RE | Admit: 2015-12-28 | Discharge: 2015-12-28 | Disposition: A | Discharge status: Home / Self Care | Payer: Medicare Other | Source: Ambulatory Visit | Attending: Urology | Admitting: Urology

## 2015-12-28 DIAGNOSIS — Z01810 Encounter for preprocedural cardiovascular examination: Secondary | ICD-10-CM | POA: Insufficient documentation

## 2015-12-28 DIAGNOSIS — Z01812 Encounter for preprocedural laboratory examination: Secondary | ICD-10-CM | POA: Diagnosis present

## 2015-12-28 HISTORY — DX: Other specified postprocedural states: Z98.890

## 2015-12-28 HISTORY — DX: Nausea with vomiting, unspecified: R11.2

## 2015-12-28 LAB — CBC
HEMATOCRIT: 34.3 % — AB (ref 35.0–47.0)
HEMOGLOBIN: 11.7 g/dL — AB (ref 12.0–16.0)
MCH: 32.2 pg (ref 26.0–34.0)
MCHC: 34.3 g/dL (ref 32.0–36.0)
MCV: 94.1 fL (ref 80.0–100.0)
Platelets: 281 10*3/uL (ref 150–440)
RBC: 3.64 MIL/uL — AB (ref 3.80–5.20)
RDW: 12.9 % (ref 11.5–14.5)
WBC: 6.4 10*3/uL (ref 3.6–11.0)

## 2015-12-28 LAB — BASIC METABOLIC PANEL
Anion gap: 10 (ref 5–15)
BUN: 34 mg/dL — ABNORMAL HIGH (ref 6–20)
CHLORIDE: 103 mmol/L (ref 101–111)
CO2: 26 mmol/L (ref 22–32)
Calcium: 9.9 mg/dL (ref 8.9–10.3)
Creatinine, Ser: 1.55 mg/dL — ABNORMAL HIGH (ref 0.44–1.00)
GFR calc non Af Amer: 28 mL/min — ABNORMAL LOW (ref >60)
GFR, EST AFRICAN AMERICAN: 33 mL/min — AB (ref >60)
Glucose, Bld: 107 mg/dL — ABNORMAL HIGH (ref 65–99)
POTASSIUM: 5.2 mmol/L — AB (ref 3.5–5.1)
SODIUM: 139 mmol/L (ref 135–145)

## 2015-12-28 NOTE — Patient Instructions (Signed)
  Your procedure is scheduled on: Monday 01/08/16 Report to Day Surgery. 2ND FLOOR MEDICAL MALL ENTRANCE To find out your arrival time please call [phone] between 1PM - 3PM on Friday 01/05/16.  Remember: Instructions that are not followed completely may result in serious medical risk, up to and including death, or upon the discretion of your surgeon and anesthesiologist your surgery may need to be rescheduled.    __X__ 1. Do not eat food or drink liquids after midnight. No gum chewing or hard candies.     __X__ 2. No Alcohol for 24 hours before or after surgery.   ____ 3. Bring all medications with you on the day of surgery if instructed.    __X__ 4. Notify your doctor if there is any change in your medical condition     (cold, fever, infections).     Do not wear jewelry, make-up, hairpins, clips or nail polish.  Do not wear lotions, powders, or perfumes.   Do not shave 48 hours prior to surgery. Men may shave face and neck.  Do not bring valuables to the hospital.    [facility] is not responsible for any belongings or valuables.               Contacts, dentures or bridgework may not be worn into surgery.  Leave your suitcase in the car. After surgery it may be brought to your room.  For patients admitted to the hospital, discharge time is determined by your                treatment team.   Patients discharged the day of surgery will not be allowed to drive home.   Please read over the following fact sheets that you were given:   Surgical Site Infection Prevention   __X__ Take these medicines the morning of surgery with A SIP OF WATER:    1. AMLODIPINE  2. LOSARTAN  3. (METOPROLOL IF STILL TAKING)  4.  5.  6.  ____ Fleet Enema (as directed)   ____ Use CHG Soap as directed  ____ Use inhalers on the day of surgery  ____ Stop metformin 2 days prior to surgery    ____ Take 1/2 of usual insulin dose the night before surgery and none on the morning of surgery.   ____  Stop Coumadin/Plavix/aspirin on   ____ Stop Anti-inflammatories on    ____ Stop supplements until after surgery.    ____ Bring C-Pap to the hospital.

## 2015-12-28 NOTE — Pre-Procedure Instructions (Signed)
Anesthesia - EKG, Echo, and Stress Testing from 01/2015 at [facility] (bpm) 68   PR Interval (msec) 154   QRS Interval (msec) 74   QT Interval (msec) 404   QTc (msec) 429    Result Narrative  Normal sinus rhythm Left axis deviation T wave abnormality, consider anterior ischemia Abnormal ECG No previous ECGs available I reviewed and concur with this report. Electronically signed [id number] MD, KEN [phone]) on 01/11/2015 7:34:24 AM   Result Narrative                     [place]                            [id number]                   [place] #: [id number]         [place], [place], [place] [address]             Date: 01/06/2015 01:08 PM                                                                  Adult Female Age: 82 yrs                    ECHOCARDIOGRAM REPORT                         Outpatient           STUDY:Stress Echo        TAPE:                         KC::KCWC            ECHO:Yes   DOPPLER:Yes  FILE:0000-000-000             MD1:  HEDRICK, JAMES           COLOR:Yes  CONTRAST:No      MACHINE:Philips            Height: 60 in       RV BIOPSY:No         3D:No   SOUND QLTY:Moderate           Weight: 95 lb          MEDIUM:None  BSA: 1.4 m2 ___________________________________________________________________________________________      HISTORY:SOB       REASON:Assess, LV function   Indication:Z01.818 Pre-op evaluation, R06.02 SOB (shortness of breath)  ___________________________________________________________________________________________ STRESS ECHOCARDIOGRAPHY           Protocol:Treadmill (Modified Bruce)             Drugs:None Target Heart Rate:111 bpm        Maximum Predicted Heart Rate: 130  bpm  +-------------------+-------------------------+-------------------------+------------+        Stage            Duration (mm:ss)         Heart Rate (bpm)         BP      +-------------------+-------------------------+-------------------------+------------+       RESTING                                           72              134/70    +-------------------+-------------------------+-------------------------+------------+   MANUAL EXERCISE             2:01                     142                /       +-------------------+-------------------------+-------------------------+------------+       RECOVERY                6:34                      71              180/70    +-------------------+-------------------------+-------------------------+------------+    Stress Duration:2:01 mm:ss   Max Stress H.R.:142 bpm        Target Heart Rate Achieved: Yes   ___________________________________________________________________________________________ WALL SEGMENT CHANGES                        Rest          Stress Anterior Septum Basal:Normal        Hyperkinetic                   [id number]        Hyperkinetic                Apical:Normal        Hyperkinetic    Anterior Wall Basal:Normal        Hyperkinetic                   [id number]        Hyperkinetic                Apical:Normal        Hyperkinetic     Lateral Wall Basal:Normal        Hyperkinetic                   [id number]        Hyperkinetic                Apical:Normal        Hyperkinetic   Posterior Wall Basal:Normal        Hyperkinetic  [id number]        Hyperkinetic    Inferior Wall Basal:Normal        Hyperkinetic                   [id number]        Hyperkinetic                Apical:Normal        Hyperkinetic  Inferior Septum Basal:Normal        Hyperkinetic                   [id number]        Hyperkinetic             Resting EF:>55% (Est.)   Stress EF: >55%  (Est.)   ___________________________________________________________________________________________ ADDITIONAL FINDINGS    Chest Discomfort:None         Arrhythmia:None Termination Reason:Fatigue    Adverse Effects:None      Complications:None   ___________________________________________________________________________________________ STRESS ECG RESULTS                                               ECG Results:Normal  ___________________________________________________________________________________________  ECHOCARDIOGRAPHIC DESCRIPTIONS  LEFT VENTRICLE         Size:Normal  Contraction:Normal    LV Masses:No Masses          [id number] Dias.FxClass:Normal  RIGHT VENTRICLE         Size:Normal               Free Wall:Normal  Contraction:Normal               RV Masses:No mass  PERICARDIUM        Fluid:No effusion  _______________________________________________________________________________________  DOPPLER ECHO and OTHER SPECIAL PROCEDURES    Aortic:MILD AR                    No AS     Mitral:TRIVIAL MR                 No MS           MV Inflow E Vel=nm*        MV Annulus E'Vel=nm*           E/E'Ratio=nm*  Tricuspid:TRIVIAL TR                 No TS           294.0 cm/sec peak TR vel   44.6 mmHg peak RV pressure  Pulmonary:TRIVIAL PR                 No PS   ___________________________________________________________________________________________  ECHOCARDIOGRAPHIC MEASUREMENTS 2D DIMENSIONS AORTA             Values      Normal Range      MAIN PA          Values      Normal Range           Annulus:  nm*       [2.1 - 2.5]                PA Main:  nm*       [1.5 - 2.1]         Aorta Sin:  nm*       [2.7 - 3.3]  RIGHT VENTRICLE       ST Junction:  nm*       [2.3 - 2.9]                RV Base:  nm*       [ < 4.2]         Asc.Aorta:  nm*       [2.3 - 3.1]                 RV Mid:  nm*       [ < 3.5]  LEFT VENTRICLE                                        RV  Length:  nm*       [ < 8.6]             LVIDd:  nm*       [3.9 - 5.3]       INFERIOR VENA CAVA             LVIDs:  nm*                                 Max. IVC:  nm*       [ <= 2.1]                FS:  nm*       [> 25]                    Min. IVC:  nm*               SWT:  nm*       [0.5 - 0.9]                   ------------------               PWT:  nm*       [0.5 - 0.9]                   nm* - not measured  LEFT ATRIUM           LA Diam:  nm*       [2.7 - 3.8]       LA A4C Area:  nm*       [ < 20]         LA Volume:  nm*       [22 - 52]  ___________________________________________________________________________________________ INTERPRETATION Mitral: TRIVIAL MR Tricuspid: TRIVIAL TR Resting EF: >55% (Est.) Post Stress EF:>55% (Est.) ECG Results: Normal   ___________________________________________________________________________________________  Electronically signed by: MD Jordan Hawks on 01/09/2015 08:56 AM             Performed By: Johnathan Hausen, RDCS, RVT       Ordering Physician: Bartholome Bill ___________________________________________________________________________________________     Echocardiogram stress test (01/06/2015 1:37 PM) Echocardiogram stress test (01/06/2015 1:37 PM)  Specimen Performing Laboratory   [facility] OTHER ORDERS    Echocardiogram stress test (01/06/2015 1:37 PM)  Narrative   CARDIOLOGY DEPARTMENT ANDJELA, HJORTH [id number]  [place] #: [id number]  [address] [place],  [address] Date: 01/06/2015 01:08 PM   Adult Female Age: 56 yrs   ECHOCARDIOGRAM REPORT Outpatient  STUDY:Stress  EchoTAPE: KC::KCWC    ECHO:Yes DOPPLER:YesFILE:0000-000-000 MD1:HEDRICK, JAMES  COLOR:YesCONTRAST:NoMACHINE:PhilipsHeight: 50 in  RV BIOPSY:No 3D:No SOUND QLTY:Moderate Weight: 95 lb   MEDIUM:None   BSA: 1.4 m2  ___________________________________________________________________________________________   HISTORY:SOB  REASON:Assess, LV function  Indication:Z01.818 Pre-op evaluation, R06.02 SOB (shortness of breath)    ___________________________________________________________________________________________  STRESS ECHOCARDIOGRAPHY     Protocol:Treadmill (Modified Bruce)  Drugs:None  Target Heart Rate:111 bpmMaximum Predicted Heart Rate: 130 bpm    +-------------------+-------------------------+-------------------------+------------+   Stage  Duration (mm:ss) Heart Rate (bpm) BP   +-------------------+-------------------------+-------------------------+------------+  RESTING 72  134/70   +-------------------+-------------------------+-------------------------+------------+  MANUAL EXERCISE 2:01 142  /  +-------------------+-------------------------+-------------------------+------------+  RECOVERY  6:3471  180/70   +-------------------+-------------------------+-------------------------+------------+    Stress Duration:2:01 mm:ss  Max Stress H.R.:142 bpmTarget Heart Rate Achieved: Yes      ___________________________________________________________________________________________  WALL SEGMENT CHANGES     RestStress  Anterior Septum Basal:NormalHyperkinetic  [id number]   Apical:NormalHyperkinetic    Anterior Wall Basal:NormalHyperkinetic  [id number]   Apical:NormalHyperkinetic     Lateral Wall Basal:NormalHyperkinetic  [id number]   Apical:NormalHyperkinetic    Posterior Wall Basal:NormalHyperkinetic  [id number]    Inferior Wall Basal:NormalHyperkinetic  [id number]   Apical:NormalHyperkinetic    Inferior Septum Basal:NormalHyperkinetic  [id number]     Resting EF:>55% (Est.) Stress EF: >55% (Est.)      ___________________________________________________________________________________________  ADDITIONAL FINDINGS    Chest Discomfort:None  Arrhythmia:None  Termination Reason:Fatigue   Adverse Effects:None   Complications:None      ___________________________________________________________________________________________  STRESS ECG RESULTS     ECG Results:Normal    ___________________________________________________________________________________________    ECHOCARDIOGRAPHIC DESCRIPTIONS    LEFT VENTRICLE  Size:Normal  Contraction:Normal   LV Masses:No Masses   [id number]  Dias.FxClass:Normal    RIGHT VENTRICLE  Size:Normal Free Wall:Normal  Contraction:Normal RV Masses:No mass    PERICARDIUM   Fluid:No effusion     _______________________________________________________________________________________    DOPPLER ECHO and OTHER SPECIAL PROCEDURES   Aortic:MILD ARNo AS     Mitral:TRIVIAL MR No MS  MV Inflow E Vel=nm*MV Annulus E'Vel=nm*  E/E'Ratio=nm*    Tricuspid:TRIVIAL TR No TS  294.0 cm/sec peak TR vel 44.6 mmHg peak RV pressure    Pulmonary:TRIVIAL PR No PS      ___________________________________________________________________________________________    ECHOCARDIOGRAPHIC MEASUREMENTS  2D DIMENSIONS  AORTA ValuesNormal RangeMAIN PAValuesNormal Range  Annulus:nm* [2.1 - 2.5]PA Main:nm* [1.5 - 2.1]  Aorta Sin:nm* [2.7 - 3.3] RIGHT VENTRICLE  ST Junction:nm* [2.3 - 2.9]RV Base:nm* [ < 4.2]  Asc.Aorta:nm* [2.3 - 3.1] RV Mid:nm* [ < 3.5]  LEFT VENTRICLERV Length:nm* [ < 8.6]  LVIDd:nm* [3.9 - 5.3] INFERIOR VENA CAVA  LVIDs:nm* Max. IVC:nm* [ <= 2.1]   FS:nm* [> 25]Min. IVC:nm*  SWT:nm* [0.5 - 0.9] ------------------  PWT:nm* [0.5 - 0.9] nm* - not measured  LEFT ATRIUM  LA Diam:nm* [2.7 - 3.8]  LA A4C Area:nm* [ < 20]  LA Volume:nm* [22 - 52]    ___________________________________________________________________________________________  INTERPRETATION  Mitral: TRIVIAL MR  Tricuspid: TRIVIAL TR  Resting EF: >55% (Est.)  Post Stress EF:>55% (Est.)  ECG Results:  Normal      ___________________________________________________________________________________________    Electronically signed by: MD Jordan Hawks on 01/09/2015 08:56 AM  Performed By: Johnathan Hausen, RDCS, RVT  Ordering Physician: Bartholome Bill  ___________________________________________________________________________________________    Echocardiogram stress test (01/06/2015 1:37 PM)  Procedure Note  Interface, Text Results In - 01/09/2015 8:57 AM EST  CARDIOLOGY DEPARTMENT [facility] [id number] [place] #: [id number] [address], [place], [place] [address] Date:  01/06/2015 01:08 PM Adult Female Age: 20 yrs ECHOCARDIOGRAM REPORT Outpatient STUDY:Stress Echo TAPE: KC::KCWC ECHO:Yes DOPPLER:Yes FILE:0000-000-000 MD1: HEDRICK, JAMES COLOR:Yes CONTRAST:No MACHINE:Philips Height: 60 in RV BIOPSY:No 3D:No SOUND QLTY:Moderate Weight: 95 lb MEDIUM:None BSA: 1.4 m2 ___________________________________________________________________________________________ HISTORY:SOB REASON:Assess, LV function Indication:Z01.818 Pre-op evaluation, R06.02 SOB (shortness of breath)  ___________________________________________________________________________________________ STRESS ECHOCARDIOGRAPHY  Protocol:Treadmill (Modified Bruce) Drugs:None Target Heart Rate:111 bpm Maximum Predicted Heart Rate: 130 bpm  +-------------------+-------------------------+-------------------------+------------+  Stage  Duration (mm:ss)  Heart Rate (bpm)  BP  +-------------------+-------------------------+-------------------------+------------+  RESTING   72  134/70  +-------------------+-------------------------+-------------------------+------------+  MANUAL EXERCISE  2:01  142  /  +-------------------+-------------------------+-------------------------+------------+  RECOVERY  6:34  71  180/70   +-------------------+-------------------------+-------------------------+------------+  Stress Duration:2:01 mm:ss Max Stress H.R.:142 bpm Target Heart Rate Achieved: Yes   ___________________________________________________________________________________________ WALL SEGMENT CHANGES  Rest Stress Anterior Septum Basal:Normal Hyperkinetic [id number] Hyperkinetic Apical:Normal Hyperkinetic  Anterior Wall Basal:Normal Hyperkinetic [id number] Hyperkinetic Apical:Normal Hyperkinetic  Lateral Wall Basal:Normal Hyperkinetic [id number] Hyperkinetic Apical:Normal Hyperkinetic  Posterior Wall Basal:Normal Hyperkinetic [id number] Hyperkinetic  Inferior Wall Basal:Normal Hyperkinetic [id number] Hyperkinetic Apical:Normal Hyperkinetic  Inferior Septum Basal:Normal Hyperkinetic [id number] Hyperkinetic  Resting EF:>55% (Est.) Stress EF: >55% (Est.)   ___________________________________________________________________________________________ ADDITIONAL FINDINGS  Chest Discomfort:None Arrhythmia:None Termination Reason:Fatigue Adverse Effects:None Complications:None   ___________________________________________________________________________________________ STRESS ECG RESULTS  ECG Results:Normal  ___________________________________________________________________________________________  ECHOCARDIOGRAPHIC DESCRIPTIONS  LEFT VENTRICLE Size:Normal Contraction:Normal LV Masses:No Masses [id number] Dias.FxClass:Normal  RIGHT VENTRICLE Size:Normal Free Wall:Normal Contraction:Normal RV Masses:No mass  PERICARDIUM Fluid:No effusion  _______________________________________________________________________________________  DOPPLER ECHO and OTHER SPECIAL PROCEDURES Aortic:MILD AR No AS  Mitral:TRIVIAL MR No MS MV Inflow E Vel=nm* MV Annulus E'Vel=nm* E/E'Ratio=nm*  Tricuspid:TRIVIAL TR No TS 294.0 cm/sec peak TR vel 44.6 mmHg peak RV pressure  Pulmonary:TRIVIAL  PR No PS   ___________________________________________________________________________________________  ECHOCARDIOGRAPHIC MEASUREMENTS 2D DIMENSIONS AORTA Values Normal Range MAIN PA Values Normal Range Annulus: nm* [2.1 - 2.5] PA Main: nm* [1.5 - 2.1] Aorta Sin: nm* [2.7 - 3.3] RIGHT VENTRICLE ST Junction: nm* [2.3 - 2.9] RV Base: nm* [ < 4.2] Asc.Aorta: nm* [2.3 - 3.1] RV Mid: nm* [ < 3.5] LEFT VENTRICLE RV Length: nm* [ < 8.6] LVIDd: nm* [3.9 - 5.3] INFERIOR VENA CAVA LVIDs: nm* Max. IVC: nm* [ <= 2.1] FS: nm* [> 25] Min. IVC: nm* SWT: nm* [0.5 - 0.9] ------------------ PWT: nm* [0.5 - 0.9] nm* - not measured LEFT ATRIUM LA Diam: nm* [2.7 - 3.8] LA A4C Area: nm* [ < 20] LA Volume: nm* [22 - 52]  ___________________________________________________________________________________________ INTERPRETATION Mitral: TRIVIAL MR Tricuspid: TRIVIAL TR Resting EF: >55% (Est.) Post Stress EF:>55% (Est.) ECG Results: Normal   ___________________________________________________________________________________________  Electronically signed by: MD Jordan Hawks on 01/09/2015 08:56 AM Performed By: Johnathan Hausen, RDCS, RVT Ordering Physician: Bartholome Bill ___________________________________________________________________________________________   EKG Results - in this encounter   ECG stress test only (01/06/2015 1:11 PM)

## 2015-12-29 ENCOUNTER — Telehealth: Payer: Self-pay | Admitting: Radiology

## 2015-12-29 NOTE — Telephone Encounter (Signed)
Pt's POA, Skeet Latch, notified of surgery r/s from 01/08/16 to 01/01/16. Per Dr [name], pt should start Cipro 250mg  bid today. Lynda Rainwater to call today between 1-3pm for arrival time to SDS. Bethena Roys voices understanding.

## 2015-12-29 NOTE — Telephone Encounter (Signed)
Dr [name] aware of labwork from 12/28/15. Ok to proceed with surgery on 01/01/16. Pt should start Cipro 250mg  bid today.

## 2016-01-01 ENCOUNTER — Ambulatory Visit: Payer: Medicare Other | Admitting: Anesthesiology

## 2016-01-01 ENCOUNTER — Ambulatory Visit
Admission: RE | Admit: 2016-01-01 | Discharge: 2016-01-01 | Disposition: A | Discharge status: Home / Self Care | Payer: Medicare Other | Source: Ambulatory Visit | Attending: Urology | Admitting: Urology

## 2016-01-01 ENCOUNTER — Telehealth: Payer: Self-pay | Admitting: Radiology

## 2016-01-01 ENCOUNTER — Encounter
Admission: RE | Disposition: A | Discharge status: Home / Self Care | Payer: Self-pay | Source: Ambulatory Visit | Attending: Urology

## 2016-01-01 ENCOUNTER — Encounter: Payer: Self-pay | Admitting: *Deleted

## 2016-01-01 DIAGNOSIS — Z960 Presence of urogenital implants: Secondary | ICD-10-CM

## 2016-01-01 DIAGNOSIS — Z882 Allergy status to sulfonamides status: Secondary | ICD-10-CM | POA: Insufficient documentation

## 2016-01-01 DIAGNOSIS — Z90710 Acquired absence of both cervix and uterus: Secondary | ICD-10-CM | POA: Insufficient documentation

## 2016-01-01 DIAGNOSIS — Z79899 Other long term (current) drug therapy: Secondary | ICD-10-CM | POA: Insufficient documentation

## 2016-01-01 DIAGNOSIS — Z888 Allergy status to other drugs, medicaments and biological substances status: Secondary | ICD-10-CM | POA: Diagnosis not present

## 2016-01-01 DIAGNOSIS — Z886 Allergy status to analgesic agent status: Secondary | ICD-10-CM | POA: Diagnosis not present

## 2016-01-01 DIAGNOSIS — F419 Anxiety disorder, unspecified: Secondary | ICD-10-CM | POA: Diagnosis not present

## 2016-01-01 DIAGNOSIS — Z87440 Personal history of urinary (tract) infections: Secondary | ICD-10-CM | POA: Diagnosis not present

## 2016-01-01 DIAGNOSIS — Q6239 Other obstructive defects of renal pelvis and ureter: Secondary | ICD-10-CM

## 2016-01-01 DIAGNOSIS — N281 Cyst of kidney, acquired: Secondary | ICD-10-CM | POA: Diagnosis not present

## 2016-01-01 DIAGNOSIS — I10 Essential (primary) hypertension: Secondary | ICD-10-CM | POA: Diagnosis not present

## 2016-01-01 DIAGNOSIS — N130 Hydronephrosis with ureteropelvic junction obstruction: Secondary | ICD-10-CM | POA: Insufficient documentation

## 2016-01-01 DIAGNOSIS — R339 Retention of urine, unspecified: Secondary | ICD-10-CM | POA: Insufficient documentation

## 2016-01-01 DIAGNOSIS — Z880 Allergy status to penicillin: Secondary | ICD-10-CM | POA: Insufficient documentation

## 2016-01-01 DIAGNOSIS — H409 Unspecified glaucoma: Secondary | ICD-10-CM | POA: Insufficient documentation

## 2016-01-01 DIAGNOSIS — N1330 Unspecified hydronephrosis: Secondary | ICD-10-CM

## 2016-01-01 DIAGNOSIS — N393 Stress incontinence (female) (male): Secondary | ICD-10-CM | POA: Diagnosis not present

## 2016-01-01 DIAGNOSIS — Z881 Allergy status to other antibiotic agents status: Secondary | ICD-10-CM | POA: Insufficient documentation

## 2016-01-01 DIAGNOSIS — Q6211 Congenital occlusion of ureteropelvic junction: Secondary | ICD-10-CM

## 2016-01-01 HISTORY — PX: CYSTOSCOPY W/ URETERAL STENT PLACEMENT: SHX1429

## 2016-01-01 SURGERY — CYSTOSCOPY, FLEXIBLE, WITH STENT REPLACEMENT
Anesthesia: General | Laterality: Right

## 2016-01-01 MED ORDER — FENTANYL CITRATE (PF) 100 MCG/2ML IJ SOLN
INTRAMUSCULAR | Status: DC | PRN
  Administered 2016-01-01: 25 ug via INTRAVENOUS

## 2016-01-01 MED ORDER — DEXAMETHASONE SODIUM PHOSPHATE 10 MG/ML IJ SOLN
INTRAMUSCULAR | Status: DC | PRN
  Administered 2016-01-01: 10 mg via INTRAVENOUS

## 2016-01-01 MED ORDER — LACTATED RINGERS IV SOLN
INTRAVENOUS | Status: DC
  Administered 2016-01-01: 10:00:00 via INTRAVENOUS

## 2016-01-01 MED ORDER — GENTAMICIN SULFATE 40 MG/ML IJ SOLN
80.0000 mg | Freq: Once | INTRAVENOUS | Status: DC
  Administered 2016-01-01: 80 mg via INTRAVENOUS

## 2016-01-01 MED ORDER — FAMOTIDINE 20 MG PO TABS
ORAL_TABLET | ORAL | Status: AC
  Filled 2016-01-01: qty 1

## 2016-01-01 MED ORDER — ONDANSETRON HCL 4 MG/2ML IJ SOLN: 4.0000 mg | Freq: Once | INTRAMUSCULAR | Status: DC | PRN

## 2016-01-01 MED ORDER — PROPOFOL 10 MG/ML IV BOLUS
INTRAVENOUS | Status: DC | PRN
  Administered 2016-01-01: 120 mg via INTRAVENOUS

## 2016-01-01 MED ORDER — FENTANYL CITRATE (PF) 100 MCG/2ML IJ SOLN: 25.0000 ug | INTRAMUSCULAR | Status: DC | PRN

## 2016-01-01 MED ORDER — GENTAMICIN IN SALINE 1.6-0.9 MG/ML-% IV SOLN
80.0000 mg | Freq: Once | INTRAVENOUS | Status: DC
  Filled 2016-01-01: qty 50

## 2016-01-01 MED ORDER — FAMOTIDINE 20 MG PO TABS
20.0000 mg | ORAL_TABLET | Freq: Once | ORAL | Status: AC
  Administered 2016-01-01: 20 mg via ORAL

## 2016-01-01 MED ORDER — ONDANSETRON HCL 4 MG/2ML IJ SOLN
INTRAMUSCULAR | Status: DC | PRN
  Administered 2016-01-01: 4 mg via INTRAVENOUS

## 2016-01-01 SURGICAL SUPPLY — 22 items
BAG DRAIN CYSTO-URO LG1000N (MISCELLANEOUS) ×3 IMPLANT
CATH URETL 5X70 OPEN END (CATHETERS) ×3 IMPLANT
CONRAY 43 FOR UROLOGY 50M (MISCELLANEOUS) ×3 IMPLANT
GLOVE BIO SURGEON STRL SZ 6.5 (GLOVE) ×2 IMPLANT
GLOVE BIO SURGEONS STRL SZ 6.5 (GLOVE) ×1
GOWN STRL REUS W/ TWL LRG LVL4 (GOWN DISPOSABLE) ×2 IMPLANT
GOWN STRL REUS W/TWL LRG LVL4 (GOWN DISPOSABLE) ×4
KIT RM TURNOVER CYSTO AR (KITS) ×3 IMPLANT
PACK CYSTO AR (MISCELLANEOUS) ×3 IMPLANT
PREP PVP WINGED SPONGE (MISCELLANEOUS) ×3 IMPLANT
RESONANCE METALLIC URETERAL STENT ×3 IMPLANT
SENSORWIRE 0.038 NOT ANGLED (WIRE) ×6
SET CYSTO W/LG BORE CLAMP LF (SET/KITS/TRAYS/PACK) ×3 IMPLANT
SOL .9 NS 3000ML IRR  AL (IV SOLUTION) ×2
SOL .9 NS 3000ML IRR UROMATIC (IV SOLUTION) ×1 IMPLANT
STENT URET 6FRX24 CONTOUR (STENTS) IMPLANT
STENT URET 6FRX26 CONTOUR (STENTS) IMPLANT
STENT URETERAL METAL 6X24 (Stent) ×3 IMPLANT
SURGILUBE 2OZ TUBE FLIPTOP (MISCELLANEOUS) ×3 IMPLANT
SYRINGE IRR TOOMEY STRL 70CC (SYRINGE) ×3 IMPLANT
WATER STERILE IRR 1000ML POUR (IV SOLUTION) ×3 IMPLANT
WIRE SENSOR 0.038 NOT ANGLED (WIRE) ×2 IMPLANT

## 2016-01-01 NOTE — Op Note (Signed)
Date of procedure: 01/01/2016  Preoperative diagnosis:  1. Right UPJ obstruction 2. Chronic right indwelling ureteral stent   Postoperative diagnosis:  1. Same as above   Procedure: 1. Cystoscopy 2. Right ureteral stent exchange with metallic stent  Surgeon: Hollice Espy, MD  Anesthesia: General  Complications: None  Intraoperative findings: Right ureteral stent exchange without difficulty today.  EBL: Normal  Specimens: None  Drains: 6 x 24 French double-J metallic right ureteral stent  Indication: April Green is a 82 y.o. 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  Her stent was last changed approximately one year ago.  After reviewing the management options for treatment, she elected to proceed with the above surgical procedure(s). We have discussed the potential benefits and risks of the procedure, side effects of the proposed treatment, the likelihood of the patient achieving the goals of the procedure, and any potential problems that might occur during the procedure or recuperation. Informed consent has been obtained.  Description of procedure:  The patient was taken to the operating room and general anesthesia was induced.  The patient was placed in the dorsal lithotomy position, prepped and draped in the usual sterile fashion, and preoperative antibiotics were administered. A preoperative time-out was performed.   A 21 French rigid cystoscopy was advanced per urethra into the bladder. Attention was turned to the right ureteral orifice from which a indwelling metal ureteral stent was identified. There is no encrustation on the stent. The remainder of the bladder appeared relatively normal with some very mild inflammatory changes on the trigone underlying the stent but no suspicious areas. The distal coil of the stent was grasped using stent graspers and it easily removed under fluoroscopic guidance. A sensor wire was then placed  up to level of the kidney without difficulty. The sheath with introducer from the metal stent Was advanced up to level of the renal pelvis using the distal marker to assess its position. The inner lumen was then removed. The metal stent was then advanced through the introducer sheath up to level of the renal pelvis. A pressure was then used to create a coil within the renal pelvis and a Seldinger technique was then used to remove the sheath leaving the stent in place. A full coil was noted within the bladder once the introducer sheath was completely removed. The scope was reintroduced into the bladder to ensure adequate position of the stent and distal coil which appeared to be a full coil in satisfactory position. The bladder was then drained and the scope was removed. She was repositioned the supine position, reversed from anesthesia, and taken to the PACU in stable condition.  Plan: Patient will follow up in 11 months to arrange for her next stent exchange in one year.  Hollice Espy, M.D.

## 2016-01-01 NOTE — Progress Notes (Signed)
No RX needed per Dr. [name] (per nurse in [facility])

## 2016-01-01 NOTE — Discharge Instructions (Signed)
You have a ureteral stent in place.  This is a tube that extends from your kidney to your bladder.  This may cause urinary bleeding, burning with urination, and urinary frequency.  Please call our office or present to the ED if you develop fevers >101 or pain which is not able to be controlled with oral pain medications.  You may be given either Flomax and/ or ditropan to help with bladder spasms and stent pain in addition to pain medications.    Your stent will need to be exchanged in 1 year, please follow up in 11 months again to arrange for stent exchange.    [facility] [address], [place], [place] [address] [phone]  AMBULATORY SURGERY  DISCHARGE INSTRUCTIONS   1) The drugs that you were given will stay in your system until tomorrow so for the next 24 hours you should not:  A) Drive an automobile B) Make any legal decisions C) Drink any alcoholic beverage   2) You may resume regular meals tomorrow.  Today it is better to start with liquids and gradually work up to solid foods.  You may eat anything you prefer, but it is better to start with liquids, then soup and crackers, and gradually work up to solid foods.   3) Please notify your doctor immediately if you have any unusual bleeding, trouble breathing, redness and pain at the surgery site, drainage, fever, or pain not relieved by medication.    4) Additional Instructions:        Please contact your physician with any problems or Same Day Surgery at [phone], Monday through Friday 6 am to 4 pm, or [place] at [facility] number at [phone].

## 2016-01-01 NOTE — Interval H&P Note (Signed)
History and Physical Interval Note:  01/01/2016 9:36 AM  April Green  has presented today for surgery, with the diagnosis of CONGENITAL RIGHT UPJ OBSTRUCTION  The various methods of treatment have been discussed with the patient and family. After consideration of risks, benefits and other options for treatment, the patient has consented to  Procedure(s): CYSTOSCOPY WITH STENT REPLACEMENT (Right) as a surgical intervention .  The patient's history has been reviewed, patient examined, no change in status, stable for surgery.  I have reviewed the patient's chart and labs.  Questions were answered to the patient's satisfaction.    RRR CTAB  Treated with cipro prior to surgery for bacterial colonization.     Hollice Espy

## 2016-01-01 NOTE — Anesthesia Preprocedure Evaluation (Signed)
Anesthesia Evaluation  Patient identified by MRN, date of birth, ID band Patient awake    Reviewed: Allergy & Precautions, NPO status , Patient's Chart, lab work & pertinent test results  History of Anesthesia Complications (+) PONV and history of anesthetic complications  Airway Mallampati: II  TM Distance: >3 FB     Dental   Pulmonary neg pulmonary ROS,    Pulmonary exam normal        Cardiovascular hypertension, Pt. on medications Normal cardiovascular exam     Neuro/Psych Anxiety negative neurological ROS     GI/Hepatic negative GI ROS, Neg liver ROS,   Endo/Other  negative endocrine ROS  Renal/GU Renal cyst   Incomplete bladder opening..stress incontinence    Musculoskeletal negative musculoskeletal ROS (+)   Abdominal Normal abdominal exam  (+)   Peds negative pediatric ROS (+)  Hematology negative hematology ROS (+)   Anesthesia Other Findings   Reproductive/Obstetrics                             Anesthesia Physical Anesthesia Plan  ASA: III  Anesthesia Plan: General   Post-op Pain Management:    Induction: Intravenous  Airway Management Planned: LMA  Additional Equipment:   Intra-op Plan:   Post-operative Plan: Extubation in OR  Informed Consent: I have reviewed the patients History and Physical, chart, labs and discussed the procedure including the risks, benefits and alternatives for the proposed anesthesia with the patient or authorized representative who has indicated his/her understanding and acceptance.   Dental advisory given  Plan Discussed with: CRNA and Surgeon  Anesthesia Plan Comments:         Anesthesia Quick Evaluation

## 2016-01-01 NOTE — Anesthesia Procedure Notes (Signed)
Procedure Name: LMA Insertion Date/Time: 01/01/2016 10:43 AM Performed by: Jonna Clark Pre-anesthesia Checklist: Patient identified, Patient being monitored, Timeout performed, Emergency Drugs available and Suction available Patient Re-evaluated:Patient Re-evaluated prior to inductionOxygen Delivery Method: Circle system utilized Preoxygenation: Pre-oxygenation with 100% oxygen Intubation Type: IV induction Ventilation: Mask ventilation without difficulty LMA: LMA inserted LMA Size: 3.0 Tube type: Oral Number of attempts: 1 Placement Confirmation: positive ETCO2 and breath sounds checked- equal and bilateral Tube secured with: Tape Dental Injury: Teeth and Oropharynx as per pre-operative assessment

## 2016-01-01 NOTE — Transfer of Care (Signed)
Immediate Anesthesia Transfer of Care Note  Patient: April Green  Procedure(s) Performed: Procedure(s): CYSTOSCOPY WITH STENT REPLACEMENT (Right)  Patient Location: PACU  Anesthesia Type:General  Level of Consciousness: awake, alert  and lethargic  Airway & Oxygen Therapy: Patient Spontanous Breathing and Patient connected to face mask oxygen  Post-op Assessment: Report given to RN and Post -op Vital signs reviewed and stable  Post vital signs: Reviewed and stable  Last Vitals:  Filed Vitals:   01/01/16 0958 01/01/16 1109  BP: 144/65 113/55  Pulse: 73 73  Temp: 36.7 C 36.5 C  Resp: 16 16    Complications: No apparent anesthesia complications

## 2016-01-01 NOTE — Telephone Encounter (Signed)
Advised pt's POA & niece, Skeet Latch, that pt should continue Estrace beginning Wed., 01/03/16 & continue using every Monday, Wednesday & Friday going forward. Bethena Roys voices understanding.

## 2016-01-01 NOTE — H&P (View-Only) (Signed)
1:24 PM   April Green February 03, 1923 [id number]  Referring provider: [name], MD [address] [facility] [place], [place] [address]  Chief Complaint  Patient presents with  . Recurrent UTI    follow up one month  . Vaginitis    HPI: Patient is a 82 year old Caucasian female who has an indwelling right ureteral stent due to congenital UPJ obstruction and atrophic vaginitis who presents today for follow-up exam.  Previous history Patient is a 82 year old white female who presents today at the request of her primary care physician Dr. [name] for a positive urine culture that was obtained on 10/16/2015.  It is positive for Klebsiella.  She states that she presented for labs prior to her annual physical and she was not having any urinary symptoms at that time.  She also had positive urine culture in May 2016 for Escherichia coli.  She had an episode of chills and fever for one day, so she was started on Augmentin at her last visit.  She developed some GI upset with the Augmentin, so she was switched to Cipro.  She continues to have positive urine cultures without urinary tract symptoms. I suspect she may be colonized.  She still has not had any gross hematuria, dysuria, flank pain or suprapubic pain.  She also has not noted any change in her urination status.  She does not suffer from incontinence.  She has not had any nausea or vomiting.    She does have a history of a congenital right UPJ obstruction that is being managed with an indwelling stent. Her last ureteral stent exchange was on 01/11/2015 with Dr. [name].  She does not report any symptoms of stent discomfort.  She was started on vaginal estrogen cream one month ago for atrophic vaginitis. She has been using the cream and not experiencing rash or vaginal irritation.  She is not experiencing gross hematuria, dysuria or suprapubic or flank pain. She denies fevers, chills, nausea or vomiting.   PMH: Past Medical  History  Diagnosis Date  . Anxiety   . Glaucoma   . Hypertension   . Incomplete bladder emptying   . Chronic UTI   . Acute cystitis   . Renal cyst   . Hydronephrosis   . UPJ obstruction, congenital   . SUI (stress urinary incontinence, female)     Surgical History: Past Surgical History  Procedure Laterality Date  . Cysto stent exchange      once a year for last 6 years  . Hernia repair  05/2008  . Abdominal hysterectomy      Home Medications:    Medication List       This list is accurate as of: 12/11/15  1:24 PM.  Always use your most recent med list.               acetaminophen 325 MG tablet  Commonly known as:  TYLENOL  Take by mouth.     amLODipine 10 MG tablet  Commonly known as:  NORVASC  Take by mouth.     amoxicillin 875 MG tablet  Commonly known as:  AMOXIL  Reported on 12/11/2015     amoxicillin-clavulanate 875-125 MG tablet  Commonly known as:  AUGMENTIN  Take 1 tablet by mouth every 12 (twelve) hours.     ciprofloxacin 500 MG tablet  Commonly known as:  CIPRO  Take 1 tablet (500 mg total) by mouth every 12 (twelve) hours.     ciprofloxacin  500 MG/5ML (10%) suspension  Commonly known as:  CIPRO  Take by mouth. Reported on 12/11/2015     diazepam 2 MG tablet  Commonly known as:  VALIUM  Take by mouth.     estradiol 0.1 MG/GM vaginal cream  Commonly known as:  ESTRACE VAGINAL  Apply 0.5mg  (pea-sized amount)  just inside the vaginal introitus with a finger-tip every night for two weeks and then Monday, Wednesday and Friday nights.     estrogens (conjugated) 0.625 MG tablet  Commonly known as:  PREMARIN  Take 0.625 mg by mouth daily. Monday, Wednesday and Friday     latanoprost 0.005 % ophthalmic solution  Commonly known as:  XALATAN  Apply to eye.     LORazepam 0.5 MG tablet  Commonly known as:  ATIVAN  TAKE 1 TABLET BY MOUTH EVERY NIGHT AT BEDTIME     losartan 25 MG tablet  Commonly known as:  COZAAR  TAKE 1 TABLET BY MOUTH EVERY DAY      metoprolol succinate 25 MG 24 hr tablet  Commonly known as:  TOPROL-XL  Take 25 mg by mouth.     PRESERVISION AREDS PO  Take by mouth.     triamcinolone cream 0.1 %  Commonly known as:  KENALOG  Apply topically. Reported on 11/07/2015        Allergies:  Allergies  Allergen Reactions  . 5-Methoxypsoralen Other (See Comments)    Altered mental status  . Amoxicillin-Pot Clavulanate Diarrhea and Nausea Only  . Aspirin Other (See Comments)  . Azithromycin Nausea Only and Other (See Comments)    Confusion  . Ceftriaxone     Other reaction(s): NAUSEA  . Cyclobenzaprine Nausea Only  . Diphenhydramine Nausea Only and Other (See Comments)    Confusion  . Levofloxacin Other (See Comments)  . Oxycodone     Other reaction(s): OTHER  . Sulfa Antibiotics Other (See Comments)    Other reaction(s): ANAPHYLAXIS  . Menthol Nausea Only    Family History: Family History  Problem Relation Age of Onset  . Kidney disease Neg Hx   . Bladder Cancer Neg Hx     Social History:  reports that she has never smoked. She does not have any smokeless tobacco history on file. She reports that she does not drink alcohol or use illicit drugs.  ROS: UROLOGY Frequent Urination?: No Hard to postpone urination?: No Burning/pain with urination?: No Get up at night to urinate?: No Leakage of urine?: No Urine stream starts and stops?: No Trouble starting stream?: No Do you have to strain to urinate?: No Blood in urine?: No Urinary tract infection?: No Sexually transmitted disease?: No Injury to kidneys or bladder?: No Painful intercourse?: No Weak stream?: No Currently pregnant?: No Vaginal bleeding?: No Last menstrual period?: n  Gastrointestinal Nausea?: No Vomiting?: No Indigestion/heartburn?: No Diarrhea?: No Constipation?: No  Constitutional Fever: No Night sweats?: No Weight loss?: No Fatigue?: No  Skin Skin rash/lesions?: No Itching?: No  Eyes Blurred vision?:  No Double vision?: No  Ears/Nose/Throat Sore throat?: No Sinus problems?: No  Hematologic/Lymphatic Swollen glands?: No Easy bruising?: No  Cardiovascular Leg swelling?: No Chest pain?: No  Respiratory Cough?: No Shortness of breath?: No  Endocrine Excessive thirst?: No  Musculoskeletal Back pain?: No Joint pain?: No  Neurological Headaches?: No Dizziness?: No  Psychologic Depression?: No Anxiety?: No  Physical Exam: BP 130/68 mmHg  Pulse 86  Ht 5' (1.524 m)  Wt 90 lb 14.4 oz (41.232 kg)  BMI 17.75 kg/m2  Constitutional: Well nourished. Alert and oriented, No acute distress. HEENT: [place] AT, moist mucus membranes. Trachea midline, no masses. Cardiovascular: No clubbing, cyanosis, or edema. Respiratory: Normal respiratory effort, no increased work of breathing. GI: Abdomen is soft, non tender, non distended, no abdominal masses. Liver and spleen not palpable.  No hernias appreciated.  Stool sample for occult testing is not indicated.   GU: No CVA tenderness.  No bladder fullness or masses.  Atrophic vaginitis.  Introitus is more pliable.   Skin: No rashes, bruises or suspicious lesions. Lymph: No cervical or inguinal adenopathy. Neurologic: Grossly intact, no focal deficits, moving all 4 extremities. Psychiatric: Normal mood and affect.  Laboratory Data: Urinalysis Results for orders placed or performed in visit on 12/11/15  Microscopic Examination  Result Value Ref Range   WBC, UA >30W 0 -  5 /hpf   RBC, UA 3-10 (A) 0 -  2 /hpf   Epithelial Cells (non renal) 0-10 0 - 10 /hpf   Bacteria, UA Many (A) None seen/Few  Urinalysis, Complete  Result Value Ref Range   Specific Gravity, UA 1.010 1.005 - 1.030   pH, UA 7.0 5.0 - 7.5   Color, UA Yellow Yellow   Appearance Ur Cloudy (A) Clear   Leukocytes, UA 3+ (A) Negative   Protein, UA Trace (A) Negative/Trace   Glucose, UA Negative Negative   Ketones, UA Negative Negative   RBC, UA 1+ (A) Negative    Bilirubin, UA Negative Negative   Urobilinogen, Ur 0.2 0.2 - 1.0 mg/dL   Nitrite, UA Negative Negative   Microscopic Examination See below:     Assessment & Plan:    1. Recurrent UTI:   Patient's introitus is still some else, but it is pliable.  She is not experiencing symptoms of urinary tract infection.  I will send it for culture so that we may prescribe a preoperative antibiotic since she is due for her stent exchange at this time.  2. Atrophic vaginitis:    Patient will continue her vaginal estrogen cream. I have given her a sample of Estrace cream.  I will also given her prescription for the cream.  2. Congenital right UPJ obstruction:   This is managed with an indwelling ureteral stent that is changed annually.  She is due for a ureteral stent exchange in March. She will need the metal ureteral stent that can remain long-term when the ureteral stent is exchanged.  She will be scheduled for ureteral stent exchange. She is aware of the risks as she had this procedure in the past.    Return for schedule ureteral stent exchange (metal stent).  Zara Council, [facility] [address] [place], [place] [address] [phone]

## 2016-01-03 NOTE — Anesthesia Postprocedure Evaluation (Signed)
Anesthesia Post Note  Patient: April Green  Procedure(s) Performed: Procedure(s) (LRB): CYSTOSCOPY WITH STENT REPLACEMENT (Right)  Patient location during evaluation: PACU Anesthesia Type: General Level of consciousness: awake and alert and oriented Pain management: pain level controlled Vital Signs Assessment: post-procedure vital signs reviewed and stable Respiratory status: spontaneous breathing Cardiovascular status: blood pressure returned to baseline Anesthetic complications: no    Last Vitals:  Filed Vitals:   01/01/16 1241 01/01/16 1255  BP: 132/51   Pulse: 68   Temp: 35.4 C 36.5 C  Resp: 16     Last Pain:  Filed Vitals:   01/02/16 0820  PainSc: 0-No pain                 Alika Saladin

## 2016-03-12 ENCOUNTER — Telehealth: Payer: Self-pay | Admitting: Urology

## 2016-03-12 NOTE — Telephone Encounter (Signed)
April Green, called saying the Rx of Estrace that was prescribed for April Green is very expensive. Before the deductable is paid, the medication costs $200 and after it's been met it will still be $100 per month. They're wondering if she HAS to take the medication. It doesn't come in a generic cream, tablet, or patch per Ms. Green. She'd like a phone call regarding this.  ° °Pt's ph# [phone] °Thank you. °

## 2016-03-13 NOTE — Telephone Encounter (Signed)
Spoke with Bethena Roys in reference to estrace cream. Made Bethena Roys aware a compounded medication can be called into [facility]. Bethena Roys voiced understanding. Bethena Roys is going to see if insurance will cover premarin cream and call [facility] back.

## 2016-03-14 ENCOUNTER — Telehealth: Payer: Self-pay

## 2016-03-14 NOTE — Telephone Encounter (Signed)
Bethena Roys called in reference to pt and estrace cream. Bethena Roys requested Estradiol be called into [facility]. Medication was called into [facility]-message was left as pharmacy was busy. Made Judy aware.

## 2017-01-24 ENCOUNTER — Other Ambulatory Visit: Payer: Self-pay

## 2017-01-29 ENCOUNTER — Encounter: Payer: Self-pay | Admitting: Urology

## 2017-01-29 ENCOUNTER — Ambulatory Visit (INDEPENDENT_AMBULATORY_CARE_PROVIDER_SITE_OTHER): Payer: Medicare Other | Admitting: Urology

## 2017-01-29 VITALS — BP 151/70 | HR 69 | Ht 60.0 in | Wt 90.8 lb

## 2017-01-29 DIAGNOSIS — N952 Postmenopausal atrophic vaginitis: Secondary | ICD-10-CM | POA: Diagnosis not present

## 2017-01-29 DIAGNOSIS — Q6239 Other obstructive defects of renal pelvis and ureter: Secondary | ICD-10-CM

## 2017-01-29 DIAGNOSIS — N183 Chronic kidney disease, stage 3 unspecified: Secondary | ICD-10-CM

## 2017-01-29 DIAGNOSIS — Q6211 Congenital occlusion of ureteropelvic junction: Secondary | ICD-10-CM | POA: Diagnosis not present

## 2017-01-29 LAB — MICROSCOPIC EXAMINATION
RBC, UA: NONE SEEN /hpf (ref 0–?)
WBC, UA: >30 /hpf — ABNORMAL HIGH (ref 0–?)

## 2017-01-29 LAB — URINALYSIS, COMPLETE
BILIRUBIN UA: NEGATIVE
Glucose, UA: NEGATIVE
KETONES UA: NEGATIVE
Nitrite, UA: NEGATIVE
PH UA: 5 (ref 5.0–7.5)
Specific Gravity, UA: <1.005 — ABNORMAL LOW (ref 1.005–1.030)
Urobilinogen, Ur: 0.2 mg/dL (ref 0.2–1.0)

## 2017-01-29 NOTE — Progress Notes (Signed)
1:18 PM  01/29/17   April Green [birth year] [id number]  Referring provider: [name], MD [address] [facility] [place], [place] [address]  Chief Complaint  Patient presents with  . Follow-up    HPI: 83 year old Caucasian female who has an indwelling right ureteral stent due to congenital UPJ obstruction and atrophic vaginitis who presents today to arrange for annual stent exchange.    She does have a history of a congenital right UPJ obstruction that is being managed with an indwelling stent. Her last ureteral stent exchange was on 12/31/25.  She does not report any symptoms of stent discomfort.  Most recent Cr 1.8 on 01/22/17 up slightly form her baseline 1.55 on 12/28/15.  She is on estrace cream for atrophic vaginitis.    She is not experiencing gross hematuria, dysuria or suprapubic or flank pain. She denies fevers, chills, nausea or vomiting.   PMH: Past Medical History:  Diagnosis Date  . Acute cystitis   . Anxiety   . Chronic UTI   . Glaucoma   . Hydronephrosis   . Hypertension   . Incomplete bladder emptying   . PONV (postoperative nausea and vomiting)    nausea only  . Renal cyst   . SUI (stress urinary incontinence, female)   . UPJ obstruction, congenital     Surgical History: Past Surgical History:  Procedure Laterality Date  . ABDOMINAL HYSTERECTOMY    . cysto stent exchange     once a year for last 6 years  . CYSTOSCOPY W/ URETERAL STENT PLACEMENT Right 01/01/2016   Procedure: CYSTOSCOPY WITH STENT REPLACEMENT;  Surgeon: Hollice Espy, MD;  Location: [facility] ORS;  Service: Urology;  Laterality: Right;  . HERNIA REPAIR  05/2008    Home Medications:  Allergies as of 01/29/2017      Reactions   5-methoxypsoralen Other (See Comments)   Altered mental status   Amoxicillin-pot Clavulanate Diarrhea, Nausea Only   Aspirin Other (See Comments)   Azithromycin Nausea Only, Other (See Comments)   Confusion   Ceftriaxone    Other  reaction(s): NAUSEA   Cyclobenzaprine Nausea Only   Diphenhydramine Nausea Only, Other (See Comments)   Confusion   Levofloxacin Other (See Comments)   Oxycodone    Other reaction(s): OTHER   Sulfa Antibiotics Other (See Comments)   Other reaction(s): ANAPHYLAXIS   Menthol Nausea Only      Medication List       Accurate as of 01/29/17  1:18 PM. Always use your most recent med list.          acetaminophen 325 MG tablet Commonly known as:  TYLENOL Take by mouth as needed.   amLODipine 10 MG tablet Commonly known as:  NORVASC Take 10 mg by mouth daily.   diazepam 2 MG tablet Commonly known as:  VALIUM Take 2 mg by mouth as needed.   estradiol 0.1 MG/GM vaginal cream Commonly known as:  ESTRACE VAGINAL Apply 0.5mg  (pea-sized amount)  just inside the vaginal introitus with a finger-tip every night for two weeks and then Monday, Wednesday and Friday nights.   latanoprost 0.005 % ophthalmic solution Commonly known as:  XALATAN Place 1 drop into both eyes at bedtime.   LORazepam 0.5 MG tablet Commonly known as:  ATIVAN TAKE 1 TABLET BY MOUTH EVERY NIGHT AT BEDTIME   losartan 25 MG tablet Commonly known as:  COZAAR TAKE 1 TABLET BY MOUTH EVERY DAY   metoprolol succinate 25 MG 24 hr tablet  Commonly known as:  TOPROL-XL Take 25 mg by mouth. Reported on 12/28/2015   PRESERVISION AREDS PO Take 1 tablet by mouth 2 (two) times daily.   triamcinolone cream 0.1 % Commonly known as:  KENALOG Apply topically as needed. Reported on 11/07/2015       Allergies:  Allergies  Allergen Reactions  . 5-Methoxypsoralen Other (See Comments)    Altered mental status  . Amoxicillin-Pot Clavulanate Diarrhea and Nausea Only  . Aspirin Other (See Comments)  . Azithromycin Nausea Only and Other (See Comments)    Confusion  . Ceftriaxone     Other reaction(s): NAUSEA  . Cyclobenzaprine Nausea Only  . Diphenhydramine Nausea Only and Other (See Comments)    Confusion  . Levofloxacin  Other (See Comments)  . Oxycodone     Other reaction(s): OTHER  . Sulfa Antibiotics Other (See Comments)    Other reaction(s): ANAPHYLAXIS  . Menthol Nausea Only    Family History: Family History  Problem Relation Age of Onset  . Kidney disease Neg Hx   . Bladder Cancer Neg Hx   . Breast cancer Neg Hx     Social History:  reports that she has never smoked. She has never used smokeless tobacco. She reports that she does not drink alcohol or use drugs.  ROS: UROLOGY Frequent Urination?: No Hard to postpone urination?: No Burning/pain with urination?: No Get up at night to urinate?: No Leakage of urine?: No Urine stream starts and stops?: No Trouble starting stream?: No Do you have to strain to urinate?: No Blood in urine?: No Urinary tract infection?: No Sexually transmitted disease?: No Injury to kidneys or bladder?: No Painful intercourse?: No Weak stream?: No Currently pregnant?: No Vaginal bleeding?: No Last menstrual period?: n  Gastrointestinal Nausea?: No Vomiting?: No Indigestion/heartburn?: No Diarrhea?: No Constipation?: No  Constitutional Fever: No Night sweats?: No Weight loss?: No Fatigue?: No  Skin Skin rash/lesions?: No Itching?: Yes  Eyes Blurred vision?: No Double vision?: No  Ears/Nose/Throat Sore throat?: No Sinus problems?: No  Hematologic/Lymphatic Swollen glands?: No Easy bruising?: No  Cardiovascular Leg swelling?: Yes Chest pain?: No  Respiratory Cough?: No Shortness of breath?: No  Endocrine Excessive thirst?: No  Musculoskeletal Back pain?: No Joint pain?: No  Neurological Headaches?: No Dizziness?: No  Psychologic Depression?: No Anxiety?: No  Physical Exam: BP (!) 151/70 (BP Location: Left Arm, Patient Position: Sitting, Cuff Size: Normal)   Pulse 69   Ht 5' (1.524 m)   Wt 90 lb 12.8 oz (41.2 kg)   BMI 17.73 kg/m   Constitutional: Well nourished. Alert and oriented, No acute distress. HEENT:  [place] AT, moist mucus membranes. Trachea midline, no masses. Cardiovascular: No clubbing, cyanosis, or edema. Abd: Soft, NT, ND GU: No CVA tenderness.   Respiratory: Normal respiratory effort, no increased work of breathing. Neurologic: Grossly intact, no focal deficits, moving all 4 extremities. Psychiatric: Normal mood and affect.  Laboratory Data: Cr 1.80 as above  Urinalysis UA reviewed, greater than 30 white blood cells per high-powered field, many bacteria, otherwise unremarkable.  Assessment & Plan:    1. Congenital right UPJ obstruction: Managed with chronic indwelling right ureteral stent, metal exchanged annually. She is due for stent exchange.  Preop urine culture today. Suspect she is likely colonized and as such, we will plan on treating for 3 days prior to the procedure with culture specific antibiotics to decrease bacterial load.  She understands the risks of surgery including risk of infection, damage to surrounding structures, stent discomfort,  sepsis, and anesthetic complications.     2. Atrophic vaginitis:     Continue estrogen cream  Schedule right ureteral stent exchange  3. CKD, stage IIII GFR near baseline  Hollice Espy, MD  [facility] [address], [place], [place] [address] [phone]

## 2017-02-03 ENCOUNTER — Other Ambulatory Visit: Payer: Self-pay | Admitting: Radiology

## 2017-02-03 ENCOUNTER — Telehealth: Payer: Self-pay | Admitting: Radiology

## 2017-02-03 DIAGNOSIS — R319 Hematuria, unspecified: Principal | ICD-10-CM

## 2017-02-03 DIAGNOSIS — N390 Urinary tract infection, site not specified: Secondary | ICD-10-CM

## 2017-02-03 LAB — CULTURE, URINE COMPREHENSIVE

## 2017-02-03 MED ORDER — NITROFURANTOIN MONOHYD MACRO 100 MG PO CAPS: 100.0000 mg | ORAL_CAPSULE | Freq: Two times a day (BID) | ORAL | 0 refills | Status: DC

## 2017-02-03 NOTE — Telephone Encounter (Signed)
Notified pt's niece, Skeet Latch, of surgery & pre-admit testing appts as well as script sent to pharmacy for +ucx. Questions were answered & Bethena Roys voices understanding.

## 2017-02-04 ENCOUNTER — Telehealth: Payer: Self-pay

## 2017-02-04 NOTE — Telephone Encounter (Signed)
-----   Message from Hollice Espy, MD sent at 02/03/2017  2:03 PM EDT ----- Please have the patient take Macrobid 100 mg by mouth twice a day starting 5 days before the procedure, prescribe a total of 10 days.  Hollice Espy, MD

## 2017-02-04 NOTE — Telephone Encounter (Signed)
Spoke with pt niece in reference to needing to take macrobid. Niece voiced understanding.

## 2017-02-07 ENCOUNTER — Encounter
Admission: RE | Admit: 2017-02-07 | Discharge: 2017-02-07 | Disposition: A | Discharge status: Home / Self Care | Payer: Medicare Other | Source: Ambulatory Visit | Attending: Urology | Admitting: Urology

## 2017-02-07 ENCOUNTER — Other Ambulatory Visit: Payer: Self-pay | Admitting: Radiology

## 2017-02-07 DIAGNOSIS — Q6239 Other obstructive defects of renal pelvis and ureter: Secondary | ICD-10-CM | POA: Insufficient documentation

## 2017-02-07 DIAGNOSIS — Z7982 Long term (current) use of aspirin: Secondary | ICD-10-CM | POA: Insufficient documentation

## 2017-02-07 DIAGNOSIS — I129 Hypertensive chronic kidney disease with stage 1 through stage 4 chronic kidney disease, or unspecified chronic kidney disease: Secondary | ICD-10-CM | POA: Insufficient documentation

## 2017-02-07 DIAGNOSIS — Z01810 Encounter for preprocedural cardiovascular examination: Secondary | ICD-10-CM | POA: Diagnosis present

## 2017-02-07 DIAGNOSIS — N183 Chronic kidney disease, stage 3 (moderate): Secondary | ICD-10-CM | POA: Insufficient documentation

## 2017-02-07 DIAGNOSIS — Z79899 Other long term (current) drug therapy: Secondary | ICD-10-CM | POA: Insufficient documentation

## 2017-02-07 DIAGNOSIS — Z01812 Encounter for preprocedural laboratory examination: Secondary | ICD-10-CM | POA: Diagnosis present

## 2017-02-07 DIAGNOSIS — N952 Postmenopausal atrophic vaginitis: Secondary | ICD-10-CM | POA: Diagnosis not present

## 2017-02-07 DIAGNOSIS — N135 Crossing vessel and stricture of ureter without hydronephrosis: Secondary | ICD-10-CM

## 2017-02-07 HISTORY — DX: Unspecified macular degeneration: H35.30

## 2017-02-07 HISTORY — DX: Malignant (primary) neoplasm, unspecified: C80.1

## 2017-02-07 HISTORY — DX: Dizziness and giddiness: R42

## 2017-02-07 LAB — PROTIME-INR
INR: 0.95
PROTHROMBIN TIME: 12.7 s (ref 11.4–15.2)

## 2017-02-07 LAB — BASIC METABOLIC PANEL
ANION GAP: 6 (ref 5–15)
BUN: 34 mg/dL — ABNORMAL HIGH (ref 6–20)
CALCIUM: 9.4 mg/dL (ref 8.9–10.3)
CO2: 26 mmol/L (ref 22–32)
Chloride: 104 mmol/L (ref 101–111)
Creatinine, Ser: 1.66 mg/dL — ABNORMAL HIGH (ref 0.44–1.00)
GFR, EST AFRICAN AMERICAN: 30 mL/min — AB (ref >60)
GFR, EST NON AFRICAN AMERICAN: 26 mL/min — AB (ref >60)
GLUCOSE: 88 mg/dL (ref 65–99)
POTASSIUM: 4.7 mmol/L (ref 3.5–5.1)
SODIUM: 136 mmol/L (ref 135–145)

## 2017-02-07 NOTE — Care Management (Signed)
EKG reviewed. Inverted T's noted, however, no change from EKG  Of 2015.

## 2017-02-07 NOTE — Patient Instructions (Signed)
Your procedure is scheduled on: Tuesday 02/18/17 Report to [facility]. 2ND FLOOR MEDICAL MALL ENTRANCE. To find out your arrival time please call [phone] between 1PM - 3PM on Monday 02/17/17.  Remember: Instructions that are not followed completely may result in serious medical risk, up to and including death, or upon the discretion of your surgeon and anesthesiologist your surgery may need to be rescheduled.    __X__ 1. Do not eat food or drink liquids after midnight. No gum chewing or hard candies.     __X__ 2. No Alcohol for 24 hours before or after surgery.   ____ 3. Bring all medications with you on the day of surgery if instructed.    __X__ 4. Notify your doctor if there is any change in your medical condition     (cold, fever, infections).             __X___5. No smoking within 24 hours of your surgery.     Do not wear jewelry, make-up, hairpins, clips or nail polish.  Do not wear lotions, powders, or perfumes.   Do not shave 48 hours prior to surgery. Men may shave face and neck.  Do not bring valuables to the hospital.    [facility] is not responsible for any belongings or valuables.               Contacts, dentures or bridgework may not be worn into surgery.  Leave your suitcase in the car. After surgery it may be brought to your room.  For patients admitted to the hospital, discharge time is determined by your                treatment team.   Patients discharged the day of surgery will not be allowed to drive home.   Please read over the following fact sheets that you were given:   Pain Booklet and MRSA Information   __X__ Take these medicines the morning of surgery with A SIP OF WATER:    1. YOUR MORNING BLOOD PRESSURE PILL  2.   3.   4.  5.  6.  ____ Fleet Enema (as directed)   ____ Use CHG Soap as directed  ____ Use inhalers on the day of surgery  ____ Stop metformin 2 days prior to surgery    ____ Take 1/2 of usual insulin dose the night before  surgery and none on the morning of surgery.   ____ Stop Coumadin/Plavix/aspirin on   __X__ Stop Anti-inflammatories such as Advil, Aleve, Ibuprofen, Motrin, Naproxen, Naprosyn, Goodies,powder, or aspirin products.  OK to take Tylenol.   __X__ Stop supplements until after surgery.    ____ Bring C-Pap to the hospital.

## 2017-02-08 NOTE — Pre-Procedure Instructions (Signed)
Met B sent to Dr. Brandon and Anesthesia for review. 

## 2017-02-18 ENCOUNTER — Encounter: Payer: Self-pay | Admitting: *Deleted

## 2017-02-18 ENCOUNTER — Ambulatory Visit: Payer: Medicare Other | Admitting: Registered Nurse

## 2017-02-18 ENCOUNTER — Encounter
Admission: RE | Disposition: A | Discharge status: Home / Self Care | Payer: Self-pay | Source: Ambulatory Visit | Attending: Urology

## 2017-02-18 ENCOUNTER — Ambulatory Visit
Admission: RE | Admit: 2017-02-18 | Discharge: 2017-02-18 | Disposition: A | Discharge status: Home / Self Care | Payer: Medicare Other | Source: Ambulatory Visit | Attending: Urology | Admitting: Urology

## 2017-02-18 DIAGNOSIS — F419 Anxiety disorder, unspecified: Secondary | ICD-10-CM | POA: Insufficient documentation

## 2017-02-18 DIAGNOSIS — Z79899 Other long term (current) drug therapy: Secondary | ICD-10-CM | POA: Insufficient documentation

## 2017-02-18 DIAGNOSIS — Q6239 Other obstructive defects of renal pelvis and ureter: Secondary | ICD-10-CM | POA: Insufficient documentation

## 2017-02-18 DIAGNOSIS — N135 Crossing vessel and stricture of ureter without hydronephrosis: Secondary | ICD-10-CM

## 2017-02-18 DIAGNOSIS — N952 Postmenopausal atrophic vaginitis: Secondary | ICD-10-CM | POA: Diagnosis not present

## 2017-02-18 DIAGNOSIS — I2510 Atherosclerotic heart disease of native coronary artery without angina pectoris: Secondary | ICD-10-CM | POA: Diagnosis not present

## 2017-02-18 DIAGNOSIS — Z466 Encounter for fitting and adjustment of urinary device: Secondary | ICD-10-CM | POA: Insufficient documentation

## 2017-02-18 DIAGNOSIS — H409 Unspecified glaucoma: Secondary | ICD-10-CM | POA: Diagnosis not present

## 2017-02-18 DIAGNOSIS — N183 Chronic kidney disease, stage 3 (moderate): Secondary | ICD-10-CM | POA: Insufficient documentation

## 2017-02-18 DIAGNOSIS — I129 Hypertensive chronic kidney disease with stage 1 through stage 4 chronic kidney disease, or unspecified chronic kidney disease: Secondary | ICD-10-CM | POA: Diagnosis not present

## 2017-02-18 DIAGNOSIS — Q6211 Congenital occlusion of ureteropelvic junction: Secondary | ICD-10-CM

## 2017-02-18 HISTORY — PX: CYSTOSCOPY W/ URETERAL STENT PLACEMENT: SHX1429

## 2017-02-18 SURGERY — CYSTOSCOPY, FLEXIBLE, WITH STENT REPLACEMENT
Anesthesia: General | Site: Ureter | Laterality: Right | Wound class: Clean Contaminated

## 2017-02-18 MED ORDER — ONDANSETRON HCL 4 MG/2ML IJ SOLN
INTRAMUSCULAR | Status: AC
  Filled 2017-02-18: qty 2

## 2017-02-18 MED ORDER — LIDOCAINE HCL (CARDIAC) 20 MG/ML IV SOLN
INTRAVENOUS | Status: DC | PRN
  Administered 2017-02-18: 40 mg via INTRAVENOUS

## 2017-02-18 MED ORDER — ONDANSETRON HCL 4 MG/2ML IJ SOLN: 4.0000 mg | Freq: Once | INTRAMUSCULAR | Status: DC | PRN

## 2017-02-18 MED ORDER — PROPOFOL 10 MG/ML IV BOLUS
INTRAVENOUS | Status: AC
  Filled 2017-02-18: qty 20

## 2017-02-18 MED ORDER — FENTANYL CITRATE (PF) 100 MCG/2ML IJ SOLN
INTRAMUSCULAR | Status: AC
  Filled 2017-02-18: qty 2

## 2017-02-18 MED ORDER — FENTANYL CITRATE (PF) 100 MCG/2ML IJ SOLN
INTRAMUSCULAR | Status: DC | PRN
  Administered 2017-02-18: 12.5 ug via INTRAVENOUS

## 2017-02-18 MED ORDER — CEFAZOLIN IN D5W 1 GM/50ML IV SOLN
INTRAVENOUS | Status: AC
  Filled 2017-02-18: qty 50

## 2017-02-18 MED ORDER — FAMOTIDINE 20 MG PO TABS
ORAL_TABLET | ORAL | Status: DC
Start: 2017-02-18 — End: 2017-02-18
  Filled 2017-02-18: qty 1

## 2017-02-18 MED ORDER — FAMOTIDINE 20 MG PO TABS
20.0000 mg | ORAL_TABLET | Freq: Once | ORAL | Status: AC
  Administered 2017-02-18: 20 mg via ORAL

## 2017-02-18 MED ORDER — FENTANYL CITRATE (PF) 100 MCG/2ML IJ SOLN: 25.0000 ug | INTRAMUSCULAR | Status: DC | PRN

## 2017-02-18 MED ORDER — CEFAZOLIN IN D5W 1 GM/50ML IV SOLN
1.0000 g | INTRAVENOUS | Status: AC
  Administered 2017-02-18: 1 g via INTRAVENOUS

## 2017-02-18 MED ORDER — LIDOCAINE HCL (PF) 2 % IJ SOLN
INTRAMUSCULAR | Status: AC
  Filled 2017-02-18: qty 2

## 2017-02-18 MED ORDER — SODIUM CHLORIDE 0.9 % IV SOLN
INTRAVENOUS | Status: DC
  Administered 2017-02-18: 10:00:00 via INTRAVENOUS

## 2017-02-18 MED ORDER — EPHEDRINE SULFATE 50 MG/ML IJ SOLN
INTRAMUSCULAR | Status: DC | PRN
  Administered 2017-02-18: 10 mg via INTRAVENOUS

## 2017-02-18 MED ORDER — ONDANSETRON HCL 4 MG/2ML IJ SOLN
INTRAMUSCULAR | Status: DC | PRN
  Administered 2017-02-18: 4 mg via INTRAVENOUS

## 2017-02-18 MED ORDER — IOTHALAMATE MEGLUMINE 43 % IV SOLN
INTRAVENOUS | Status: DC | PRN
  Administered 2017-02-18: 15 mL

## 2017-02-18 MED ORDER — PROPOFOL 10 MG/ML IV BOLUS
INTRAVENOUS | Status: DC | PRN
  Administered 2017-02-18: 50 mg via INTRAVENOUS

## 2017-02-18 MED ORDER — DEXAMETHASONE SODIUM PHOSPHATE 10 MG/ML IJ SOLN
INTRAMUSCULAR | Status: DC | PRN
  Administered 2017-02-18: 5 mg via INTRAVENOUS

## 2017-02-18 MED ORDER — DEXAMETHASONE SODIUM PHOSPHATE 10 MG/ML IJ SOLN
INTRAMUSCULAR | Status: AC
  Filled 2017-02-18: qty 1

## 2017-02-18 SURGICAL SUPPLY — 21 items
BAG DRAIN CYSTO-URO LG1000N (MISCELLANEOUS) ×3 IMPLANT
CATH URETL 5X70 OPEN END (CATHETERS) ×3 IMPLANT
CONRAY 43 FOR UROLOGY 50M (MISCELLANEOUS) ×3 IMPLANT
GLOVE BIO SURGEON STRL SZ 6.5 (GLOVE) ×2 IMPLANT
GLOVE BIO SURGEONS STRL SZ 6.5 (GLOVE) ×1
GOWN STRL REUS W/ TWL LRG LVL3 (GOWN DISPOSABLE) ×2 IMPLANT
GOWN STRL REUS W/TWL LRG LVL3 (GOWN DISPOSABLE) ×4
KIT RM TURNOVER CYSTO AR (KITS) ×3 IMPLANT
PACK CYSTO AR (MISCELLANEOUS) ×3 IMPLANT
SCRUB POVIDONE IODINE 4 OZ (MISCELLANEOUS) ×3 IMPLANT
SENSORWIRE 0.038 NOT ANGLED (WIRE) ×3
SET CYSTO W/LG BORE CLAMP LF (SET/KITS/TRAYS/PACK) ×3 IMPLANT
SOL .9 NS 3000ML IRR  AL (IV SOLUTION) ×2
SOL .9 NS 3000ML IRR UROMATIC (IV SOLUTION) ×1 IMPLANT
STENT URET 6FRX24 CONTOUR (STENTS) IMPLANT
STENT URET 6FRX26 CONTOUR (STENTS) IMPLANT
STENT URETERAL METAL 6X24 (Stent) ×3 IMPLANT
SURGILUBE 2OZ TUBE FLIPTOP (MISCELLANEOUS) ×3 IMPLANT
SYRINGE IRR TOOMEY STRL 70CC (SYRINGE) IMPLANT
WATER STERILE IRR 1000ML POUR (IV SOLUTION) ×3 IMPLANT
WIRE SENSOR 0.038 NOT ANGLED (WIRE) ×1 IMPLANT

## 2017-02-18 NOTE — Discharge Instructions (Signed)
You have a ureteral stent in place.  This is a tube that extends from your kidney to your bladder.  This may cause urinary bleeding, burning with urination, and urinary frequency.  Please call our office or present to the ED if you develop fevers >101 or pain which is not able to be controlled with oral pain medications.  You may be given either Flomax and/ or ditropan to help with bladder spasms and stent pain in addition to pain medications.   ° °[facility] °[address], [address] °[place], [place] [address] °[phone] ° ° ° °AMBULATORY SURGERY  °DISCHARGE INSTRUCTIONS ° ° °1) The drugs that you were given will stay in your system until tomorrow so for the next 24 hours you should not: ° °A) Drive an automobile °B) Make any legal decisions °C) Drink any alcoholic beverage ° ° °2) You may resume regular meals tomorrow.  Today it is better to start with liquids and gradually work up to solid foods. ° °You may eat anything you prefer, but it is better to start with liquids, then soup and crackers, and gradually work up to solid foods. ° ° °3) Please notify your doctor immediately if you have any unusual bleeding, trouble breathing, redness and pain at the surgery site, drainage, fever, or pain not relieved by medication. ° ° ° °4) Additional Instructions: ° ° ° ° ° ° ° °Please contact your physician with any problems or Same Day Surgery at [phone], Monday through Friday 6 am to 4 pm, or [place] at  Main number at [phone]. °

## 2017-02-18 NOTE — Anesthesia Preprocedure Evaluation (Signed)
Anesthesia Evaluation  Patient identified by MRN, date of birth, ID band Patient awake    Reviewed: Allergy & Precautions, NPO status , reviewed documented beta blocker date and time   History of Anesthesia Complications (+) PONV  Airway Mallampati: III       Dental  (+) Teeth Intact   Pulmonary neg pulmonary ROS,    Pulmonary exam normal        Cardiovascular Exercise Tolerance: Good hypertension, Pt. on medications + CAD   Rhythm:Regular     Neuro/Psych Anxiety negative neurological ROS     GI/Hepatic negative GI ROS, Neg liver ROS,   Endo/Other  negative endocrine ROS  Renal/GU      Musculoskeletal negative musculoskeletal ROS (+)   Abdominal Normal abdominal exam  (+)   Peds negative pediatric ROS (+)  Hematology negative hematology ROS (+)   Anesthesia Other Findings   Reproductive/Obstetrics                             Anesthesia Physical Anesthesia Plan  ASA: III  Anesthesia Plan: General   Post-op Pain Management:    Induction: Intravenous  Airway Management Planned: LMA  Additional Equipment:   Intra-op Plan:   Post-operative Plan: Extubation in OR  Informed Consent: I have reviewed the patients History and Physical, chart, labs and discussed the procedure including the risks, benefits and alternatives for the proposed anesthesia with the patient or authorized representative who has indicated his/her understanding and acceptance.     Plan Discussed with: CRNA  Anesthesia Plan Comments:         Anesthesia Quick Evaluation

## 2017-02-18 NOTE — Anesthesia Postprocedure Evaluation (Signed)
Anesthesia Post Note  Patient: April Green  Procedure(s) Performed: Procedure(s) (LRB): CYSTOSCOPY WITH STENT REPLACEMENT (Right)  Patient location during evaluation: PACU Anesthesia Type: General Level of consciousness: awake Pain management: pain level controlled Vital Signs Assessment: post-procedure vital signs reviewed and stable Respiratory status: spontaneous breathing Cardiovascular status: stable Anesthetic complications: no     Last Vitals:  Vitals:   02/18/17 1152 02/18/17 1207  BP: 132/61 128/63  Pulse: 69 68  Resp: 10 13  Temp: 36.6 C     Last Pain:  Vitals:   02/18/17 1000  TempSrc: Tympanic                 VAN STAVEREN,[name]

## 2017-02-18 NOTE — Op Note (Signed)
Date of procedure: 02/18/17  Preoperative diagnosis:  1. Right UPJ obstruction 2. Chronic right indwelling ureteral stent   Postoperative diagnosis:  1. Same as above   Procedure: 1. Cystoscopy 2. Right ureteral stent exchange with metallic stent  Surgeon: Hollice Espy, MD  Anesthesia: General  Complications: None  Intraoperative findings: Right ureteral stent exchange without difficulty today.  EBL: Normal  Specimens: None  Drains: 6 x 24 French double-J metallic right ureteral stent  Indication: April Green is a 83 y.o. 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  Her stent was last changed approximately one year ago.  After reviewing the management options for treatment, she elected to proceed with the above surgical procedure(s). We have discussed the potential benefits and risks of the procedure, side effects of the proposed treatment, the likelihood of the patient achieving the goals of the procedure, and any potential problems that might occur during the procedure or recuperation. Informed consent has been obtained.  Description of procedure:  The patient was taken to the operating room and general anesthesia was induced.  The patient was placed in the dorsal lithotomy position, prepped and draped in the usual sterile fashion, and preoperative antibiotics were administered. A preoperative time-out was performed.   A 21 French rigid cystoscopy was advanced per urethra into the bladder. Attention was turned to the right ureteral orifice from which a indwelling metal ureteral stent was identified. There is no encrustation on the stent. The remainder of the bladder appeared relatively normal with some very mild inflammatory changes on the trigone underlying the stent but no suspicious areas. The distal coil of the stent was grasped using stent graspers and it easily removed under fluoroscopic guidance. A sensor wire was  then placed up to level of the kidney without difficulty. The sheath with introducer from the metal stent Was advanced up to level of the renal pelvis using the distal marker to assess its position. The inner lumen was then removed. The metal stent was then advanced through the introducer sheath up to level of the renal pelvis. A pressure was then used to create a coil within the renal pelvis and a Seldinger technique was then used to remove the sheath leaving the stent in place. A full coil was noted within the bladder once the introducer sheath was completely removed. The scope was reintroduced into the bladder to ensure adequate position of the stent and distal coil which appeared to be a full coil in satisfactory position. The bladder was then drained and the scope was removed. She was repositioned the supine position, reversed from anesthesia, and taken to the PACU in stable condition.  Plan: Patient will follow up in 11 months to arrange for her next stent exchange in one year.  Hollice Espy, M.D. Preoperative diagnosis:  3. Right UPJ obstruction 4. Chronic right indwelling ureteral stent   Postoperative diagnosis:  2. Same as above   Procedure: 3. Cystoscopy 4. Right ureteral stent exchange with metallic stent  Surgeon: Hollice Espy, MD  Anesthesia: General  Complications: None  Intraoperative findings: Right ureteral stent exchange without difficulty today.  EBL: Normal  Specimens: None  Drains: 6 x 24 French double-J metallic right ureteral stent  Indication: April Green is a 83 y.o. 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  Her stent was last changed approximately one year ago.  After reviewing the management options for treatment, she elected to proceed with the above  surgical procedure(s). We have discussed the potential benefits and risks of the procedure, side effects of the proposed treatment, the likelihood  of the patient achieving the goals of the procedure, and any potential problems that might occur during the procedure or recuperation. Informed consent has been obtained.  Description of procedure:  The patient was taken to the operating room and general anesthesia was induced.  The patient was placed in the dorsal lithotomy position, prepped and draped in the usual sterile fashion, and preoperative antibiotics were administered. A preoperative time-out was performed.   A 21 French rigid cystoscopy was advanced per urethra into the bladder. Attention was turned to the right ureteral orifice from which a indwelling metal ureteral stent was identified. There is no encrustation on the stent. The remainder of the bladder appeared relatively normal with some very mild inflammatory changes on the trigone underlying the stent but no suspicious areas. The distal coil of the stent was grasped using stent graspers and it easily removed under fluoroscopic guidance. A sensor wire was then placed up to level of the kidney without difficulty. The sheath with introducer from the metal stent was advanced up to level of the renal pelvis using the distal marker to assess its position. The inner lumen was then removed. The metal stent was then advanced through the introducer sheath up to level of the renal pelvis. A pressure was then used to create a coil within the renal pelvis and a Seldinger technique was then used to remove the sheath leaving the stent in place. A full coil was noted within the bladder once the introducer sheath was completely removed. The scope was reintroduced into the bladder to ensure adequate position of the stent and distal coil which appeared to be a full coil in satisfactory position. The bladder was then drained and the scope was removed. She was repositioned the supine position, reversed from anesthesia, and taken to the PACU in stable condition.  Plan: Patient will follow up in 11 months to  arrange for her next stent exchange in one year.  Hollice Espy, M.D.

## 2017-02-18 NOTE — H&P (View-Only) (Signed)
1:18 PM  01/29/17   April Green 11/19/1922 [id number]  Referring provider: [name], MD [address] [facility] [place], [place] [address]  Chief Complaint  Patient presents with  . Follow-up    HPI: 83 year old Caucasian female who has an indwelling right ureteral stent due to congenital UPJ obstruction and atrophic vaginitis who presents today to arrange for annual stent exchange.    She does have a history of a congenital right UPJ obstruction that is being managed with an indwelling stent. Her last ureteral stent exchange was on 12/31/25.  She does not report any symptoms of stent discomfort.  Most recent Cr 1.8 on 01/22/17 up slightly form her baseline 1.55 on 12/28/15.  She is on estrace cream for atrophic vaginitis.    She is not experiencing gross hematuria, dysuria or suprapubic or flank pain. She denies fevers, chills, nausea or vomiting.   PMH: Past Medical History:  Diagnosis Date  . Acute cystitis   . Anxiety   . Chronic UTI   . Glaucoma   . Hydronephrosis   . Hypertension   . Incomplete bladder emptying   . PONV (postoperative nausea and vomiting)    nausea only  . Renal cyst   . SUI (stress urinary incontinence, female)   . UPJ obstruction, congenital     Surgical History: Past Surgical History:  Procedure Laterality Date  . ABDOMINAL HYSTERECTOMY    . cysto stent exchange     once a year for last 6 years  . CYSTOSCOPY W/ URETERAL STENT PLACEMENT Right 01/01/2016   Procedure: CYSTOSCOPY WITH STENT REPLACEMENT;  Surgeon: Hollice Espy, MD;  Location: [facility] ORS;  Service: Urology;  Laterality: Right;  . HERNIA REPAIR  05/2008    Home Medications:  Allergies as of 01/29/2017      Reactions   5-methoxypsoralen Other (See Comments)   Altered mental status   Amoxicillin-pot Clavulanate Diarrhea, Nausea Only   Aspirin Other (See Comments)   Azithromycin Nausea Only, Other (See Comments)   Confusion   Ceftriaxone    Other  reaction(s): NAUSEA   Cyclobenzaprine Nausea Only   Diphenhydramine Nausea Only, Other (See Comments)   Confusion   Levofloxacin Other (See Comments)   Oxycodone    Other reaction(s): OTHER   Sulfa Antibiotics Other (See Comments)   Other reaction(s): ANAPHYLAXIS   Menthol Nausea Only      Medication List       Accurate as of 01/29/17  1:18 PM. Always use your most recent med list.          acetaminophen 325 MG tablet Commonly known as:  TYLENOL Take by mouth as needed.   amLODipine 10 MG tablet Commonly known as:  NORVASC Take 10 mg by mouth daily.   diazepam 2 MG tablet Commonly known as:  VALIUM Take 2 mg by mouth as needed.   estradiol 0.1 MG/GM vaginal cream Commonly known as:  ESTRACE VAGINAL Apply 0.5mg  (pea-sized amount)  just inside the vaginal introitus with a finger-tip every night for two weeks and then Monday, Wednesday and Friday nights.   latanoprost 0.005 % ophthalmic solution Commonly known as:  XALATAN Place 1 drop into both eyes at bedtime.   LORazepam 0.5 MG tablet Commonly known as:  ATIVAN TAKE 1 TABLET BY MOUTH EVERY NIGHT AT BEDTIME   losartan 25 MG tablet Commonly known as:  COZAAR TAKE 1 TABLET BY MOUTH EVERY DAY   metoprolol succinate 25 MG 24 hr tablet  Commonly known as:  TOPROL-XL Take 25 mg by mouth. Reported on 12/28/2015   PRESERVISION AREDS PO Take 1 tablet by mouth 2 (two) times daily.   triamcinolone cream 0.1 % Commonly known as:  KENALOG Apply topically as needed. Reported on 11/07/2015       Allergies:  Allergies  Allergen Reactions  . 5-Methoxypsoralen Other (See Comments)    Altered mental status  . Amoxicillin-Pot Clavulanate Diarrhea and Nausea Only  . Aspirin Other (See Comments)  . Azithromycin Nausea Only and Other (See Comments)    Confusion  . Ceftriaxone     Other reaction(s): NAUSEA  . Cyclobenzaprine Nausea Only  . Diphenhydramine Nausea Only and Other (See Comments)    Confusion  . Levofloxacin  Other (See Comments)  . Oxycodone     Other reaction(s): OTHER  . Sulfa Antibiotics Other (See Comments)    Other reaction(s): ANAPHYLAXIS  . Menthol Nausea Only    Family History: Family History  Problem Relation Age of Onset  . Kidney disease Neg Hx   . Bladder Cancer Neg Hx   . Breast cancer Neg Hx     Social History:  reports that she has never smoked. She has never used smokeless tobacco. She reports that she does not drink alcohol or use drugs.  ROS: UROLOGY Frequent Urination?: No Hard to postpone urination?: No Burning/pain with urination?: No Get up at night to urinate?: No Leakage of urine?: No Urine stream starts and stops?: No Trouble starting stream?: No Do you have to strain to urinate?: No Blood in urine?: No Urinary tract infection?: No Sexually transmitted disease?: No Injury to kidneys or bladder?: No Painful intercourse?: No Weak stream?: No Currently pregnant?: No Vaginal bleeding?: No Last menstrual period?: n  Gastrointestinal Nausea?: No Vomiting?: No Indigestion/heartburn?: No Diarrhea?: No Constipation?: No  Constitutional Fever: No Night sweats?: No Weight loss?: No Fatigue?: No  Skin Skin rash/lesions?: No Itching?: Yes  Eyes Blurred vision?: No Double vision?: No  Ears/Nose/Throat Sore throat?: No Sinus problems?: No  Hematologic/Lymphatic Swollen glands?: No Easy bruising?: No  Cardiovascular Leg swelling?: Yes Chest pain?: No  Respiratory Cough?: No Shortness of breath?: No  Endocrine Excessive thirst?: No  Musculoskeletal Back pain?: No Joint pain?: No  Neurological Headaches?: No Dizziness?: No  Psychologic Depression?: No Anxiety?: No  Physical Exam: BP (!) 151/70 (BP Location: Left Arm, Patient Position: Sitting, Cuff Size: Normal)   Pulse 69   Ht 5' (1.524 m)   Wt 90 lb 12.8 oz (41.2 kg)   BMI 17.73 kg/m   Constitutional: Well nourished. Alert and oriented, No acute distress. HEENT:  [place] AT, moist mucus membranes. Trachea midline, no masses. Cardiovascular: No clubbing, cyanosis, or edema. Abd: Soft, NT, ND GU: No CVA tenderness.   Respiratory: Normal respiratory effort, no increased work of breathing. Neurologic: Grossly intact, no focal deficits, moving all 4 extremities. Psychiatric: Normal mood and affect.  Laboratory Data: Cr 1.80 as above  Urinalysis UA reviewed, greater than 30 white blood cells per high-powered field, many bacteria, otherwise unremarkable.  Assessment & Plan:    1. Congenital right UPJ obstruction: Managed with chronic indwelling right ureteral stent, metal exchanged annually. She is due for stent exchange.  Preop urine culture today. Suspect she is likely colonized and as such, we will plan on treating for 3 days prior to the procedure with culture specific antibiotics to decrease bacterial load.  She understands the risks of surgery including risk of infection, damage to surrounding structures, stent discomfort,  sepsis, and anesthetic complications.     2. Atrophic vaginitis:     Continue estrogen cream  Schedule right ureteral stent exchange  3. CKD, stage IIII GFR near baseline  Hollice Espy, MD  [facility] [address], [place], [place] [address] [phone]

## 2017-02-18 NOTE — Anesthesia Post-op Follow-up Note (Cosign Needed)
Anesthesia QCDR form completed.        

## 2017-02-18 NOTE — Interval H&P Note (Signed)
History and Physical Interval Note:  02/18/2017 11:07 AM  April Green  has presented today for surgery, with the diagnosis of right upj obstruction   The various methods of treatment have been discussed with the patient and family. After consideration of risks, benefits and other options for treatment, the patient has consented to  Procedure(s): CYSTOSCOPY WITH STENT REPLACEMENT (Right) as a surgical intervention .  The patient's history has been reviewed, patient examined, no change in status, stable for surgery.  I have reviewed the patient's chart and labs.  Questions were answered to the patient's satisfaction.    RRR CTAB  Being treated with abx perioperatively to reduce bacterial load.  Hollice Espy

## 2017-02-18 NOTE — Transfer of Care (Signed)
Immediate Anesthesia Transfer of Care Note  Patient: April Green  Procedure(s) Performed: Procedure(s): CYSTOSCOPY WITH STENT REPLACEMENT (Right)  Patient Location: PACU  Anesthesia Type:General  Level of Consciousness: sedated  Airway & Oxygen Therapy: Patient Spontanous Breathing and Patient connected to face mask oxygen  Post-op Assessment: Report given to RN and Post -op Vital signs reviewed and stable  Post vital signs: Reviewed and stable  Last Vitals:  Vitals:   02/18/17 1000 02/18/17 1152  BP: (!) 170/61 132/61  Pulse: 78 69  Resp: 18 10  Temp: (!) 35.8 C 36.6 C    Last Pain:  Vitals:   02/18/17 1000  TempSrc: Tympanic         Complications: No apparent anesthesia complications

## 2017-02-18 NOTE — Anesthesia Procedure Notes (Signed)
Procedure Name: LMA Insertion Date/Time: 02/18/2017 11:25 AM Performed by: Dionne Bucy Pre-anesthesia Checklist: Patient identified, Patient being monitored, Timeout performed, Emergency Drugs available and Suction available Patient Re-evaluated:Patient Re-evaluated prior to inductionOxygen Delivery Method: Circle system utilized Preoxygenation: Pre-oxygenation with 100% oxygen Intubation Type: IV induction Ventilation: Mask ventilation without difficulty LMA: LMA inserted LMA Size: 3.0 Tube type: Oral Number of attempts: 1 Placement Confirmation: positive ETCO2 and breath sounds checked- equal and bilateral Tube secured with: Tape Dental Injury: Teeth and Oropharynx as per pre-operative assessment

## 2017-02-19 ENCOUNTER — Encounter: Payer: Self-pay | Admitting: Urology

## 2017-02-20 ENCOUNTER — Telehealth: Payer: Self-pay

## 2017-02-20 NOTE — Telephone Encounter (Signed)
Bethena Roys called in reference to pt. Bethena Roys stated that pt had stent replaced on Tuesday and then last night pt experience urinary leakage during the night. Reinforced with Bethena Roys that can happen after having a stent placed/exchanged as the ureter is dilated. Bethena Roys voiced understanding.

## 2017-03-18 ENCOUNTER — Other Ambulatory Visit: Payer: Self-pay

## 2017-03-18 DIAGNOSIS — N952 Postmenopausal atrophic vaginitis: Secondary | ICD-10-CM

## 2017-03-18 MED ORDER — ESTRADIOL 0.1 MG/GM VA CREA: TOPICAL_CREAM | VAGINAL | 12 refills | Status: DC

## 2017-03-19 ENCOUNTER — Telehealth: Payer: Self-pay | Admitting: Urology

## 2017-03-19 NOTE — Telephone Encounter (Signed)
Orders faxed

## 2017-03-19 NOTE — Telephone Encounter (Signed)
Pt would like for vaginal cream to be sent to [facility] per Lyndee Leo.  [phone]

## 2017-03-19 NOTE — Telephone Encounter (Signed)
Patient called to inquire if it would be okay for her to take Miralax.    Per Dr. [name], it would be okay for her to take.  Advised patient and she expressed understanding.

## 2017-04-25 ENCOUNTER — Other Ambulatory Visit: Payer: Self-pay

## 2017-11-14 ENCOUNTER — Other Ambulatory Visit: Payer: Self-pay

## 2018-01-23 ENCOUNTER — Encounter: Payer: Self-pay | Admitting: Urology

## 2018-01-23 ENCOUNTER — Other Ambulatory Visit: Payer: Self-pay | Admitting: Radiology

## 2018-01-23 ENCOUNTER — Ambulatory Visit (INDEPENDENT_AMBULATORY_CARE_PROVIDER_SITE_OTHER): Payer: Medicare Other | Admitting: Urology

## 2018-01-23 VITALS — BP 143/71 | HR 58 | Ht 60.0 in | Wt 90.0 lb

## 2018-01-23 DIAGNOSIS — N1330 Unspecified hydronephrosis: Secondary | ICD-10-CM | POA: Diagnosis not present

## 2018-01-23 DIAGNOSIS — N952 Postmenopausal atrophic vaginitis: Secondary | ICD-10-CM | POA: Diagnosis not present

## 2018-01-23 DIAGNOSIS — N135 Crossing vessel and stricture of ureter without hydronephrosis: Secondary | ICD-10-CM

## 2018-01-23 LAB — URINALYSIS, COMPLETE
Bilirubin, UA: NEGATIVE
GLUCOSE, UA: NEGATIVE
KETONES UA: NEGATIVE
NITRITE UA: NEGATIVE
Protein, UA: NEGATIVE
RBC UA: NEGATIVE
SPEC GRAV UA: 1.01 (ref 1.005–1.030)
UUROB: 0.2 mg/dL (ref 0.2–1.0)
pH, UA: 6 (ref 5.0–7.5)

## 2018-01-23 LAB — MICROSCOPIC EXAMINATION: RBC MICROSCOPIC, UA: NONE SEEN /HPF (ref 0–2)

## 2018-01-23 MED ORDER — ESTROGENS, CONJUGATED 0.625 MG/GM VA CREA: 1.0000 | TOPICAL_CREAM | Freq: Every day | VAGINAL | 12 refills | Status: DC

## 2018-01-23 NOTE — Progress Notes (Signed)
3:01 PM  01/24/18   VALKYRIE GUARDIOLA 1922-12-26 [id number]   Referring provider: [name], MD [address] [facility] [place], [place] [address]  Chief Complaint  Patient presents with  . Follow-up    HPI: 84 year old Caucasian female who has an indwelling right ureteral stent due to congenital UPJ obstruction and atrophic vaginitis who presents today to arrange for annual stent exchange.    She does have a history of a congenital right UPJ obstruction that is being managed with an indwelling stent. Her last ureteral stent exchange was on4/17/2018.  She does not report any symptoms of stent discomfort.  Most recent Cr 1.5 on 01/16/18.  She is on estrace cream for atrophic vaginitis.  She is concerned because her company pharmacy has gone out of business.  She is seeing a new pharmacy to get this prescription filled.   She is not experiencing gross hematuria, dysuria or suprapubic or flank pain. She denies fevers, chills, nausea or vomiting.   PMH: Past Medical History:  Diagnosis Date  . Acute cystitis   . Anxiety   . Cancer ([place])    skin cancer  . Chronic UTI   . Glaucoma   . Hydronephrosis   . Hypertension   . Incomplete bladder emptying   . Macular degeneration   . PONV (postoperative nausea and vomiting)    nausea only  . Renal cyst   . SUI (stress urinary incontinence, female)   . UPJ obstruction, congenital   . Vertigo     Surgical History: Past Surgical History:  Procedure Laterality Date  . ABDOMINAL HYSTERECTOMY    . cysto stent exchange     once a year for last 6 years  . CYSTOSCOPY W/ URETERAL STENT PLACEMENT Right 01/01/2016   Procedure: CYSTOSCOPY WITH STENT REPLACEMENT;  Surgeon: Hollice Espy, MD;  Location: [facility];  Service: Urology;  Laterality: Right;  . CYSTOSCOPY W/ URETERAL STENT PLACEMENT Right 02/18/2017   Procedure: CYSTOSCOPY WITH STENT REPLACEMENT;  Surgeon: Hollice Espy, MD;  Location: [facility];  Service: Urology;   Laterality: Right;  . HERNIA REPAIR  05/2008    Home Medications:  Allergies as of 01/23/2018      Reactions   5-methoxypsoralen Other (See Comments)   Altered mental status   Amoxicillin-pot Clavulanate Diarrhea, Nausea Only   Aspirin Other (See Comments)   Azithromycin Nausea Only, Other (See Comments)   Confusion   Ceftriaxone    Other reaction(s): NAUSEA   Cyclobenzaprine Nausea Only   Diphenhydramine Nausea Only, Other (See Comments)   Confusion   Levofloxacin Other (See Comments)   Oxycodone    Other reaction(s): OTHER   Sulfa Antibiotics Other (See Comments)   Other reaction(s): ANAPHYLAXIS   Menthol Nausea Only      Medication List        Accurate as of 01/23/18 11:59 PM. Always use your most recent med list.          acetaminophen 325 MG tablet Commonly known as:  TYLENOL Take 325-650 mg by mouth every 6 (six) hours as needed for moderate pain.   amLODipine 10 MG tablet Commonly known as:  NORVASC Take 10 mg by mouth at bedtime.   citalopram 10 MG tablet Commonly known as:  CELEXA START WITH 1/2 TABLET BY MOUTH AT BEDTIME   conjugated estrogens vaginal cream Commonly known as:  PREMARIN Place 1 Applicatorful vaginally daily. Use pea sized amount M-W-Fr before bedtime   diazepam 2 MG tablet  Commonly known as:  VALIUM Take 2 mg by mouth every 8 (eight) hours as needed (Vertigo).   estradiol 0.1 MG/GM vaginal cream Commonly known as:  ESTRACE VAGINAL Apply 0.5mg  (pea-sized amount)  just inside the vaginal introitus with a finger-tip every night for two weeks and then Monday, Wednesday and Friday nights.   Fish Oil 1000 MG Caps Take 1 capsule by mouth daily.   Garlic 003 MG Tabs Take by mouth.   latanoprost 0.005 % ophthalmic solution Commonly known as:  XALATAN Place 1 drop into both eyes at bedtime.   LORazepam 0.5 MG tablet Commonly known as:  ATIVAN Take 0.5 mg by mouth every 8 (eight) hours as needed for anxiety.   losartan 25 MG  tablet Commonly known as:  COZAAR q AM   Melatonin 3 MG Tabs Take 3 mg by mouth at bedtime.   PRESERVISION AREDS PO Take 1 tablet by mouth 2 (two) times daily.   triamcinolone cream 0.1 % Commonly known as:  KENALOG Apply 1 application topically 2 (two) times daily as needed. Reported on 11/07/2015       Allergies:  Allergies  Allergen Reactions  . 5-Methoxypsoralen Other (See Comments)    Altered mental status  . Amoxicillin-Pot Clavulanate Diarrhea and Nausea Only  . Aspirin Other (See Comments)  . Azithromycin Nausea Only and Other (See Comments)    Confusion  . Ceftriaxone     Other reaction(s): NAUSEA  . Cyclobenzaprine Nausea Only  . Diphenhydramine Nausea Only and Other (See Comments)    Confusion  . Levofloxacin Other (See Comments)  . Oxycodone     Other reaction(s): OTHER  . Sulfa Antibiotics Other (See Comments)    Other reaction(s): ANAPHYLAXIS  . Menthol Nausea Only    Family History: Family History  Problem Relation Age of Onset  . Kidney disease Neg Hx   . Bladder Cancer Neg Hx   . Breast cancer Neg Hx     Social History:  reports that she has never smoked. She has never used smokeless tobacco. She reports that she does not drink alcohol or use drugs.  ROS: UROLOGY Frequent Urination?: No Hard to postpone urination?: No Burning/pain with urination?: No Get up at night to urinate?: No Leakage of urine?: No Urine stream starts and stops?: No Trouble starting stream?: No Do you have to strain to urinate?: No Blood in urine?: No Urinary tract infection?: No Sexually transmitted disease?: No Injury to kidneys or bladder?: No Painful intercourse?: No Weak stream?: No Currently pregnant?: No Vaginal bleeding?: No Last menstrual period?: n  Gastrointestinal Nausea?: No Vomiting?: No Diarrhea?: No Constipation?: No  Constitutional Fever: No Night sweats?: No Weight loss?: No Fatigue?: No  Skin Skin rash/lesions?: No Itching?:  No  Eyes Blurred vision?: No Double vision?: No  Ears/Nose/Throat Sore throat?: No Sinus problems?: No  Hematologic/Lymphatic Swollen glands?: No Easy bruising?: No  Cardiovascular Leg swelling?: No Chest pain?: No  Respiratory Cough?: No Shortness of breath?: No  Endocrine Excessive thirst?: No  Musculoskeletal Back pain?: No Joint pain?: No  Neurological Headaches?: No Dizziness?: Yes  Psychologic Depression?: No Anxiety?: No  Physical Exam: BP (!) 143/71 (BP Location: Left Arm, Patient Position: Sitting, Cuff Size: Small)   Pulse (!) 58   Ht 5' (1.524 m)   Wt 90 lb (40.8 kg)   BMI 17.58 kg/m   Constitutional: Well nourished. Alert and oriented, No acute distress.  Accompanied by family friend again today.  Appears younger than stated age. HEENT: [place]  AT, moist mucus membranes. Trachea midline, no masses. Cardiovascular: No clubbing, cyanosis, or edema. GU: No CVA tenderness.   Respiratory: Normal respiratory effort, no increased work of breathing. Neurologic: Grossly intact, no focal deficits, moving all 4 extremities. Psychiatric: Normal mood and affect.  Laboratory Data: As above  Urinalysis Results for orders placed or performed in visit on 01/23/18  Microscopic Examination  Result Value Ref Range   WBC, UA 11-30 (A) 0 - 5 /hpf   RBC, UA None seen 0 - 2 /hpf   Epithelial Cells (non renal) 0-10 0 - 10 /hpf   Mucus, UA Present (A) Not Estab.   Bacteria, UA Many (A) None seen/Few  Urinalysis, Complete  Result Value Ref Range   Specific Gravity, UA 1.010 1.005 - 1.030   pH, UA 6.0 5.0 - 7.5   Color, UA Yellow Yellow   Appearance Ur Cloudy (A) Clear   Leukocytes, UA 2+ (A) Negative   Protein, UA Negative Negative/Trace   Glucose, UA Negative Negative   Ketones, UA Negative Negative   RBC, UA Negative Negative   Bilirubin, UA Negative Negative   Urobilinogen, Ur 0.2 0.2 - 1.0 mg/dL   Nitrite, UA Negative Negative   Microscopic Examination  See below:     Assessment & Plan:    1. Congenital right UPJ obstruction: Managed with chronic indwelling right ureteral stent, metal exchanged annually. She is due for stent exchange.  Preop urine culture today. Suspect she is likely colonized and as such, we will plan on treating for 3 days prior to the procedure with culture specific antibiotics to decrease bacterial load.  She understands the risks of surgery including risk of infection, damage to surrounding structures, stent discomfort, sepsis, and anesthetic complications.     2. Atrophic vaginitis:     Continue estrogen cream-called in and renewed to be shipped to her home Reviewed application instructions  3. CKD, stage IIII GFR near baseline  Hollice Espy, MD  [facility] [address], [place], [place] [address] [phone]

## 2018-01-23 NOTE — H&P (View-Only) (Signed)
3:01 PM  01/24/18   April Green 1923/10/26 [id number]   Referring provider: [name], MD [address] [facility] [place], [place] [address]  Chief Complaint  Patient presents with  . Follow-up    HPI: 84 year old Caucasian female who has an indwelling right ureteral stent due to congenital UPJ obstruction and atrophic vaginitis who presents today to arrange for annual stent exchange.    She does have a history of a congenital right UPJ obstruction that is being managed with an indwelling stent. Her last ureteral stent exchange was on4/17/2018.  She does not report any symptoms of stent discomfort.  Most recent Cr 1.5 on 01/16/18.  She is on estrace cream for atrophic vaginitis.  She is concerned because her company pharmacy has gone out of business.  She is seeing a new pharmacy to get this prescription filled.   She is not experiencing gross hematuria, dysuria or suprapubic or flank pain. She denies fevers, chills, nausea or vomiting.   PMH: Past Medical History:  Diagnosis Date  . Acute cystitis   . Anxiety   . Cancer ([place])    skin cancer  . Chronic UTI   . Glaucoma   . Hydronephrosis   . Hypertension   . Incomplete bladder emptying   . Macular degeneration   . PONV (postoperative nausea and vomiting)    nausea only  . Renal cyst   . SUI (stress urinary incontinence, female)   . UPJ obstruction, congenital   . Vertigo     Surgical History: Past Surgical History:  Procedure Laterality Date  . ABDOMINAL HYSTERECTOMY    . cysto stent exchange     once a year for last 6 years  . CYSTOSCOPY W/ URETERAL STENT PLACEMENT Right 01/01/2016   Procedure: CYSTOSCOPY WITH STENT REPLACEMENT;  Surgeon: Hollice Espy, MD;  Location: [facility];  Service: Urology;  Laterality: Right;  . CYSTOSCOPY W/ URETERAL STENT PLACEMENT Right 02/18/2017   Procedure: CYSTOSCOPY WITH STENT REPLACEMENT;  Surgeon: Hollice Espy, MD;  Location: [facility];  Service: Urology;   Laterality: Right;  . HERNIA REPAIR  05/2008    Home Medications:  Allergies as of 01/23/2018      Reactions   5-methoxypsoralen Other (See Comments)   Altered mental status   Amoxicillin-pot Clavulanate Diarrhea, Nausea Only   Aspirin Other (See Comments)   Azithromycin Nausea Only, Other (See Comments)   Confusion   Ceftriaxone    Other reaction(s): NAUSEA   Cyclobenzaprine Nausea Only   Diphenhydramine Nausea Only, Other (See Comments)   Confusion   Levofloxacin Other (See Comments)   Oxycodone    Other reaction(s): OTHER   Sulfa Antibiotics Other (See Comments)   Other reaction(s): ANAPHYLAXIS   Menthol Nausea Only      Medication List        Accurate as of 01/23/18 11:59 PM. Always use your most recent med list.          acetaminophen 325 MG tablet Commonly known as:  TYLENOL Take 325-650 mg by mouth every 6 (six) hours as needed for moderate pain.   amLODipine 10 MG tablet Commonly known as:  NORVASC Take 10 mg by mouth at bedtime.   citalopram 10 MG tablet Commonly known as:  CELEXA START WITH 1/2 TABLET BY MOUTH AT BEDTIME   conjugated estrogens vaginal cream Commonly known as:  PREMARIN Place 1 Applicatorful vaginally daily. Use pea sized amount M-W-Fr before bedtime   diazepam 2 MG tablet  Commonly known as:  VALIUM Take 2 mg by mouth every 8 (eight) hours as needed (Vertigo).   estradiol 0.1 MG/GM vaginal cream Commonly known as:  ESTRACE VAGINAL Apply 0.5mg  (pea-sized amount)  just inside the vaginal introitus with a finger-tip every night for two weeks and then Monday, Wednesday and Friday nights.   Fish Oil 1000 MG Caps Take 1 capsule by mouth daily.   Garlic 774 MG Tabs Take by mouth.   latanoprost 0.005 % ophthalmic solution Commonly known as:  XALATAN Place 1 drop into both eyes at bedtime.   LORazepam 0.5 MG tablet Commonly known as:  ATIVAN Take 0.5 mg by mouth every 8 (eight) hours as needed for anxiety.   losartan 25 MG  tablet Commonly known as:  COZAAR q AM   Melatonin 3 MG Tabs Take 3 mg by mouth at bedtime.   PRESERVISION AREDS PO Take 1 tablet by mouth 2 (two) times daily.   triamcinolone cream 0.1 % Commonly known as:  KENALOG Apply 1 application topically 2 (two) times daily as needed. Reported on 11/07/2015       Allergies:  Allergies  Allergen Reactions  . 5-Methoxypsoralen Other (See Comments)    Altered mental status  . Amoxicillin-Pot Clavulanate Diarrhea and Nausea Only  . Aspirin Other (See Comments)  . Azithromycin Nausea Only and Other (See Comments)    Confusion  . Ceftriaxone     Other reaction(s): NAUSEA  . Cyclobenzaprine Nausea Only  . Diphenhydramine Nausea Only and Other (See Comments)    Confusion  . Levofloxacin Other (See Comments)  . Oxycodone     Other reaction(s): OTHER  . Sulfa Antibiotics Other (See Comments)    Other reaction(s): ANAPHYLAXIS  . Menthol Nausea Only    Family History: Family History  Problem Relation Age of Onset  . Kidney disease Neg Hx   . Bladder Cancer Neg Hx   . Breast cancer Neg Hx     Social History:  reports that she has never smoked. She has never used smokeless tobacco. She reports that she does not drink alcohol or use drugs.  ROS: UROLOGY Frequent Urination?: No Hard to postpone urination?: No Burning/pain with urination?: No Get up at night to urinate?: No Leakage of urine?: No Urine stream starts and stops?: No Trouble starting stream?: No Do you have to strain to urinate?: No Blood in urine?: No Urinary tract infection?: No Sexually transmitted disease?: No Injury to kidneys or bladder?: No Painful intercourse?: No Weak stream?: No Currently pregnant?: No Vaginal bleeding?: No Last menstrual period?: n  Gastrointestinal Nausea?: No Vomiting?: No Diarrhea?: No Constipation?: No  Constitutional Fever: No Night sweats?: No Weight loss?: No Fatigue?: No  Skin Skin rash/lesions?: No Itching?:  No  Eyes Blurred vision?: No Double vision?: No  Ears/Nose/Throat Sore throat?: No Sinus problems?: No  Hematologic/Lymphatic Swollen glands?: No Easy bruising?: No  Cardiovascular Leg swelling?: No Chest pain?: No  Respiratory Cough?: No Shortness of breath?: No  Endocrine Excessive thirst?: No  Musculoskeletal Back pain?: No Joint pain?: No  Neurological Headaches?: No Dizziness?: Yes  Psychologic Depression?: No Anxiety?: No  Physical Exam: BP (!) 143/71 (BP Location: Left Arm, Patient Position: Sitting, Cuff Size: Small)   Pulse (!) 58   Ht 5' (1.524 m)   Wt 90 lb (40.8 kg)   BMI 17.58 kg/m   Constitutional: Well nourished. Alert and oriented, No acute distress.  Accompanied by family friend again today.  Appears younger than stated age. HEENT: [place]  AT, moist mucus membranes. Trachea midline, no masses. Cardiovascular: No clubbing, cyanosis, or edema. GU: No CVA tenderness.   Respiratory: Normal respiratory effort, no increased work of breathing. Neurologic: Grossly intact, no focal deficits, moving all 4 extremities. Psychiatric: Normal mood and affect.  Laboratory Data: As above  Urinalysis Results for orders placed or performed in visit on 01/23/18  Microscopic Examination  Result Value Ref Range   WBC, UA 11-30 (A) 0 - 5 /hpf   RBC, UA None seen 0 - 2 /hpf   Epithelial Cells (non renal) 0-10 0 - 10 /hpf   Mucus, UA Present (A) Not Estab.   Bacteria, UA Many (A) None seen/Few  Urinalysis, Complete  Result Value Ref Range   Specific Gravity, UA 1.010 1.005 - 1.030   pH, UA 6.0 5.0 - 7.5   Color, UA Yellow Yellow   Appearance Ur Cloudy (A) Clear   Leukocytes, UA 2+ (A) Negative   Protein, UA Negative Negative/Trace   Glucose, UA Negative Negative   Ketones, UA Negative Negative   RBC, UA Negative Negative   Bilirubin, UA Negative Negative   Urobilinogen, Ur 0.2 0.2 - 1.0 mg/dL   Nitrite, UA Negative Negative   Microscopic Examination  See below:     Assessment & Plan:    1. Congenital right UPJ obstruction: Managed with chronic indwelling right ureteral stent, metal exchanged annually. She is due for stent exchange.  Preop urine culture today. Suspect she is likely colonized and as such, we will plan on treating for 3 days prior to the procedure with culture specific antibiotics to decrease bacterial load.  She understands the risks of surgery including risk of infection, damage to surrounding structures, stent discomfort, sepsis, and anesthetic complications.     2. Atrophic vaginitis:     Continue estrogen cream-called in and renewed to be shipped to her home Reviewed application instructions  3. CKD, stage IIII GFR near baseline  Hollice Espy, MD  [facility] [address] [place], [place] [address] [phone]

## 2018-01-23 NOTE — Progress Notes (Signed)
Compound Estradiol Cream called into [facility] will ship to patient

## 2018-01-26 LAB — CULTURE, URINE COMPREHENSIVE

## 2018-02-06 ENCOUNTER — Encounter
Admission: RE | Admit: 2018-02-06 | Discharge: 2018-02-06 | Disposition: A | Discharge status: Home / Self Care | Payer: Medicare Other | Source: Ambulatory Visit | Attending: Urology | Admitting: Urology

## 2018-02-06 ENCOUNTER — Other Ambulatory Visit: Payer: Self-pay

## 2018-02-06 DIAGNOSIS — N952 Postmenopausal atrophic vaginitis: Secondary | ICD-10-CM | POA: Diagnosis not present

## 2018-02-06 DIAGNOSIS — R001 Bradycardia, unspecified: Secondary | ICD-10-CM | POA: Diagnosis not present

## 2018-02-06 DIAGNOSIS — Q6239 Other obstructive defects of renal pelvis and ureter: Secondary | ICD-10-CM | POA: Insufficient documentation

## 2018-02-06 DIAGNOSIS — Z01818 Encounter for other preprocedural examination: Secondary | ICD-10-CM | POA: Insufficient documentation

## 2018-02-06 LAB — CBC
HEMATOCRIT: 36 % (ref 35.0–47.0)
Hemoglobin: 12 g/dL (ref 12.0–16.0)
MCH: 32.2 pg (ref 26.0–34.0)
MCHC: 33.2 g/dL (ref 32.0–36.0)
MCV: 96.8 fL (ref 80.0–100.0)
Platelets: 295 10*3/uL (ref 150–440)
RBC: 3.72 MIL/uL — ABNORMAL LOW (ref 3.80–5.20)
RDW: 12.9 % (ref 11.5–14.5)
WBC: 5.7 10*3/uL (ref 3.6–11.0)

## 2018-02-06 LAB — BASIC METABOLIC PANEL
Anion gap: 8 (ref 5–15)
BUN: 27 mg/dL — ABNORMAL HIGH (ref 6–20)
CALCIUM: 9.5 mg/dL (ref 8.9–10.3)
CO2: 26 mmol/L (ref 22–32)
Chloride: 102 mmol/L (ref 101–111)
Creatinine, Ser: 1.47 mg/dL — ABNORMAL HIGH (ref 0.44–1.00)
GFR, EST AFRICAN AMERICAN: 34 mL/min — AB (ref >60)
GFR, EST NON AFRICAN AMERICAN: 30 mL/min — AB (ref >60)
Glucose, Bld: 92 mg/dL (ref 65–99)
Potassium: 4.5 mmol/L (ref 3.5–5.1)
Sodium: 136 mmol/L (ref 135–145)

## 2018-02-06 NOTE — Patient Instructions (Signed)
Your procedure is scheduled on: Monday, April 15th  Report to [place]  To find out your arrival time please call [phone] between              1PM - 3PM on April 12th, FRIDAY  Remember: Instructions that are not followed completely may result in serious  medical risk, up to and including death, or upon the discretion of your surgeon  and anesthesiologist your surgery may need to be rescheduled.     _X__ 1. Do not eat food after midnight the night before your procedure.                 No gum chewing or hard candies                . You may drink clear liquids up to 2 hours                 before you are scheduled to arrive for your surgery-                   DO not drink clear liquids within 2 hours of the start of your surgery.                  Clear Liquids include:  water, apple juice without pulp, clear carbohydrate                 drink such as Clearfast of [place], Black Coffee or Tea (Do not add                 anything to coffee or tea).  __X__2.  On the morning of surgery brush your teeth with toothpaste and water,                       you may rinse your mouth with mouthwash if you wish.                           Do not swallow any toothpaste of mouthwash.     _X__ 3.  No Alcohol for 24 hours before or after surgery.   _X__ 4.  Do Not Smoke or use e-cigarettes For 24 Hours Prior to Your Surgery.                 Do not use any chewable tobacco products for at least 6 hours prior to                 surgery.  ____  5.  Bring all medications with you on the day of surgery if instructed.   ____  6.  Notify your doctor if there is any change in your medical condition      (cold, fever, infections).     Do not wear jewelry, make-up, hairpins, clips or nail polish. Do not wear lotions, powders, or perfumes. You may wear deodorant. Do not shave 48 hours prior to surgery. Men may shave face and neck. Do not bring  valuables to the hospital.    [facility] is not responsible for any belongings or valuables.  Contacts, dentures or bridgework may not be worn into surgery. Leave your suitcase in the car. After surgery it may be brought to your room. For patients admitted to the hospital, discharge time is determined by your treatment team.   Patients discharged the day of surgery will not  be allowed to drive home.   Please read over the following fact sheets that you were given:   PREPARING FOR SURGERY   ____ Take these medicines the morning of surgery with A SIP OF WATER:    1. EYE DROPS AS USUAL  2. VALIUM, IF NEEDED FOR ANXIETY  3.   4.  5.  6.  ____ Fleet Enema (as directed)   __X__ Use ANTIBACTERIAL Soap as directed  ____ Use inhalers on the day of surgery  _X___ Stop ASPIRIN PRODUCTS NOW!!  __X__ Stop Anti-inflammatories NOW!!               YOU MAY CONTINUE TO TAKE TYLENOL AS NEEDED.   ____ Stop supplements until after surgery.              THIS INCLUDES GARLIC, MELATONIN, MULTIVITS,               FISH OIL, PRESERVISION AREDS           STOP AS OF Monday April 8th  ____ Bring C-Pap to the hospital.   [place] AT NIGHT AS USUAL.  CONTINUE TAKING LOSARTAN AS USUAL, BUT DO NOT TAKE ON THE DAY OF SURGERY.  CONTINUE WITH EYE DROPS AS USUAL.  CONTINUE WITH ESTRACE BUT DO NOT USE ON THE MORNING OF SURGERY.  NO CREAMS OR LOTIONS ON DAY OF SURGERY (KENALOG)

## 2018-02-09 NOTE — Pre-Procedure Instructions (Addendum)
AS INSTRUCTED BY DR KEPHART, EKG WITH REQUEST TO HAVE CARDIAC CLEARANCE SET UP CALLED AND FAXED TO DR [place]. SPOKE WITH BRITTANY. ALSO CALLED AND FAXED TO [facility] AT DR [name] OFFICE. LM.

## 2018-02-13 NOTE — Pre-Procedure Instructions (Signed)
Echo complete4/09/2018 [place] Component Name Value Ref Range  LV Ejection Fraction (%) 55   Aortic Valve Stenosis Grade none   Aortic Valve Regurgitation Grade mild   Aortic Valve Max Velocity (m/s) 1 m/sec  Mitral Valve Stenosis Grade none   Mitral Valve Regurgitation Grade mild   Tricuspid Valve Regurgitation Grade mild   Tricuspid Valve Regurgitation Max Velocity (m/s) 2.8 m/sec  Right Ventricle Systolic Pressure (mmHg) 09.3 mmHg  LV End Diastolic Diameter (cm) 3.7 cm  LV End Systolic Diameter (cm) 2.7 cm  LV Septum Wall Thickness (cm) 1.1 cm  LV Posterior Wall Thickness (cm) 0.77 cm  Left Atrium Diameter (cm) 3.5 cm  Result Narrative   CARDIOLOGY DEPARTMENT LARISHA, VENCILL [id number] [facility] Acct #: [id number] [address] [place], [place] [address] Date: 02/12/2018 11: 07 AM  Adult Female Age: 84 yrs ECHOCARDIOGRAM REPORTOutpatient  KC^^KCWC  STUDY:CHEST WALL TAPE:MD1: FATH, KENNETH ALAN ECHO:YesDOPPLER:Yes FILE:BP: 60/ mmHg  COLOR:Yes CONTRAST:No MACHINE:Philips HR: 124  RV BIOPSY:No3D:NoSOUND QLTY:ModerateHeight: 60 in MEDIUM:NoneWeight: 88 lb  BSA: 1.3 m2 _________________________________________________________________________________________ HISTORY: DOE  REASON: Assess, LV function  INDICATION: SOB (shortness of breath) [R06.02  (ICD-10-CM)] _________________________________________________________________________________________ ECHOCARDIOGRAPHIC MEASUREMENTS 2D DIMENSIONS AORTAValues Normal Range MAIN PA ValuesNormal Range Annulus: 1.7 cm [2.1-2.5] PA Main: nm* [1.5-2.1] Aorta Sin: 2.5 cm [2.7-3.3]RIGHT VENTRICLE ST Junction: nm*[2.3-2.9] RV Base: nm* [<4.2] Asc.Aorta: nm*[2.3-3.1]RV Mid: nm* [<3.5] LEFT VENTRICLERV Length: 2.6 cm[<8.6] LVIDd: 3.7 cm [3.9-5.3]INFERIOR VENA CAVA LVIDs: 2.7 cmMax. IVC: nm* [<=2.1]  FS: 26.7 % [>25]Min. IVC: nm* SWT: 1.1 cm [0.5-0.9]------------------ PWT: 0.77 cm[0.5-0.9]nm* - not measured LEFT ATRIUM LA Diam: 3.5 cm [2.7-3.8] LA A4C Area: nm*[<20] LA Volume: nm*[22-52] _________________________________________________________________________________________ ECHOCARDIOGRAPHIC DESCRIPTIONS AORTIC ROOT  Size: Normal  Dissection: INDETERM FOR DISSECTION AORTIC VALVE  Leaflets: Tricuspid Morphology: MILDLY THICKENED  Mobility: Fully mobile LEFT VENTRICLE  Size: SMALL Anterior: Normal Contraction: Normal Lateral: Normal  Closest EF: >55% (Estimated)Septal: Normal LV Masses: No Masses Apical: Normal LVH: MILD LVHInferior: Normal Posterior: Normal  Dias.FxClass:  N/A MITRAL VALVE  Leaflets: NormalMobility: Fully mobile  Morphology: ANNULAR CALC LEFT ATRIUM  Size: Normal LA Masses: No masses IA Septum: Normal IAS MAIN PA  Size: Normal PULMONIC VALVE  Morphology: NormalMobility: Fully mobile RIGHT VENTRICLE RV Masses: No Masses Size: Normal Free Wall: Normal Contraction: Normal TRICUSPID VALVE  Leaflets: NormalMobility: Fully mobile  Morphology: Normal RIGHT ATRIUM  Size: NormalRA Other: None RA Mass: No masses PERICARDIUM Fluid: No effusion INFERIOR VENACAVA  Size: Normal Normal respiratory collapse _________________________________________________________________________________________  DOPPLER ECHO and OTHER SPECIAL PROCEDURES  Aortic: MILD ARNo AS  100.2 cm/sec peak vel4.0 mmHg peak grad  Mitral: MILD MRNo MS  2.9 cm^2 by DOPPLER  MV Inflow E Vel = 100.0 cm/secMV Annulus E'Vel = 5.3 cm/sec  E/E'Ratio = 18.9 Tricuspid: MILD TRNo TS  275.7 cm/sec peak TR vel 35.4 mmHg peak RV pressure Pulmonary: MILD PRNo PS  67.9 cm/sec peak vel 1.8 mmHg peak grad _________________________________________________________________________________________ INTERPRETATION NORMAL LEFT VENTRICULAR SYSTOLIC FUNCTION WITH MILD LVH NORMAL RIGHT VENTRICULAR SYSTOLIC FUNCTION MILD VALVULAR REGURGITATION (See above) NO VALVULAR  STENOSIS MILD to MODERATE AR MILD MR, TR, PR EF 55% Morphology: MILDLY THICKENED Closest EF: >55% (Estimated) LVH: MILD LVH Aortic: MILD AR Mitral: MILD MR Tricuspid: MILD TR _________________________________________________________________________________________ Electronically signed byMD Jordan Hawks on 02/12/2018 03: 22 PM  Performed By: Maurilio Lovely, RDCS  Ordering Physician: Bartholome Bill _________________________________________________________________________________________  Other Result Information  Interface, Text Results In - 02/12/2018  3:22 PM EDT  CARDIOLOGY DEPARTMENT                 [facility]                                    [id number]           [facility]                           Acct #: [id number]           [place], [place], [place] [address]       Date: 02/12/2018 11: 07 AM                                                              Adult   Female   Age: 84 yrs           ECHOCARDIOGRAM REPORT                              Outpatient                                                              KC^^KCWC      STUDY:CHEST WALL               TAPE:                    MD1: FATH, KENNETH ALAN       ECHO:Yes    DOPPLER:Yes       FILE:                    BP: 60/ mmHg      COLOR:Yes   CONTRAST:No     MACHINE:Philips             HR: 124  RV BIOPSY:No          3D:No  SOUND QLTY:Moderate            Height: 60 in     MEDIUM:None                                              Weight: 88 lb                                                              BSA: 1.3 m2 _________________________________________________________________________________________               HISTORY: DOE  REASON: Assess, LV function            INDICATION: SOB (shortness of breath) [R06.02  (ICD-10-CM)] _________________________________________________________________________________________ ECHOCARDIOGRAPHIC MEASUREMENTS 2D DIMENSIONS AORTA                  Values   Normal Range   MAIN PA         Values    Normal Range               Annulus: 1.7 cm       [2.1-2.5]         PA Main: nm*       [1.5-2.1]             Aorta Sin: 2.5 cm       [2.7-3.3]    RIGHT VENTRICLE           ST Junction: nm*          [2.3-2.9]         RV Base: nm*       [<4.2]             Asc.Aorta: nm*          [2.3-3.1]          RV Mid: nm*       [<3.5] LEFT VENTRICLE                                      RV Length: 2.6 cm    [<8.6]                 LVIDd: 3.7 cm       [3.9-5.3]    INFERIOR VENA CAVA                 LVIDs: 2.7 cm                        Max. IVC: nm*       [<=2.1]                    FS: 26.7 %       [>25]            Min. IVC: nm*                   SWT: 1.1 cm       [0.5-0.9]    ------------------                   PWT: 0.77 cm      [0.5-0.9]    nm* - not measured LEFT ATRIUM               LA Diam: 3.5 cm       [2.7-3.8]           LA A4C Area: nm*          [<20]             LA Volume: nm*          [22-52] _________________________________________________________________________________________ ECHOCARDIOGRAPHIC DESCRIPTIONS AORTIC ROOT                  Size: Normal            Dissection: INDETERM FOR DISSECTION AORTIC VALVE              Leaflets: Tricuspid  Morphology: MILDLY THICKENED              Mobility: Fully mobile LEFT VENTRICLE                  Size: SMALL                         Anterior: Normal           Contraction: Normal                         Lateral: Normal            Closest EF: >55% (Estimated)                Septal: Normal             LV Masses: No Masses                       Apical: Normal                   LVH: MILD LVH                      Inferior: Normal                                                     Posterior: Normal          Dias.FxClass:  N/A MITRAL VALVE              Leaflets: Normal                        Mobility: Fully mobile            Morphology: ANNULAR CALC LEFT ATRIUM                  Size: Normal                       LA Masses: No masses             IA Septum: Normal IAS MAIN PA                  Size: Normal PULMONIC VALVE            Morphology: Normal                        Mobility: Fully mobile RIGHT VENTRICLE             RV Masses: No Masses                         Size: Normal             Free Wall: Normal                     Contraction: Normal TRICUSPID VALVE              Leaflets: Normal                        Mobility: Fully mobile  Morphology: Normal RIGHT ATRIUM                  Size: Normal                        RA Other: None               RA Mass: No masses PERICARDIUM                 Fluid: No effusion INFERIOR VENACAVA                  Size: Normal Normal respiratory collapse _________________________________________________________________________________________  DOPPLER ECHO and OTHER SPECIAL PROCEDURES                Aortic: MILD AR                    No AS                        100.2 cm/sec peak vel      4.0 mmHg peak grad                Mitral: MILD MR                    No MS                        2.9 cm^2 by DOPPLER                        MV Inflow E Vel = 100.0 cm/sec      MV Annulus E'Vel = 5.3 cm/sec                        E/E'Ratio = 18.9             Tricuspid: MILD TR                    No TS                        275.7 cm/sec peak TR vel   35.4 mmHg peak RV pressure             Pulmonary: MILD PR                    No PS                        67.9 cm/sec peak vel       1.8 mmHg peak grad _________________________________________________________________________________________ INTERPRETATION NORMAL LEFT VENTRICULAR SYSTOLIC FUNCTION   WITH MILD LVH NORMAL RIGHT VENTRICULAR SYSTOLIC FUNCTION MILD VALVULAR REGURGITATION (See above) NO VALVULAR  STENOSIS MILD to MODERATE AR MILD MR, TR, PR EF 55% Morphology: MILDLY THICKENED Closest EF: >55% (Estimated) LVH: MILD LVH Aortic: MILD AR Mitral: MILD MR Tricuspid: MILD TR _________________________________________________________________________________________ Electronically signed by            MD Jordan Hawks on 02/12/2018 03: 22 PM          Performed By: Maurilio Lovely, RDCS    Ordering Physician: Bartholome Bill _________________________________________________________________________________________  Status Results Details

## 2018-02-15 MED ORDER — CEFAZOLIN SODIUM-DEXTROSE 1-4 GM/50ML-% IV SOLN
1.0000 g | INTRAVENOUS | Status: AC
  Administered 2018-02-16: 1 g via INTRAVENOUS

## 2018-02-16 ENCOUNTER — Other Ambulatory Visit: Payer: Self-pay

## 2018-02-16 ENCOUNTER — Encounter: Payer: Self-pay | Admitting: *Deleted

## 2018-02-16 ENCOUNTER — Ambulatory Visit
Admission: RE | Admit: 2018-02-16 | Discharge: 2018-02-16 | Disposition: A | Discharge status: Home / Self Care | Payer: Medicare Other | Source: Ambulatory Visit | Attending: Urology | Admitting: Urology

## 2018-02-16 ENCOUNTER — Ambulatory Visit: Payer: Medicare Other | Admitting: Anesthesiology

## 2018-02-16 ENCOUNTER — Encounter
Admission: RE | Disposition: A | Discharge status: Home / Self Care | Payer: Self-pay | Source: Ambulatory Visit | Attending: Urology

## 2018-02-16 DIAGNOSIS — Z79899 Other long term (current) drug therapy: Secondary | ICD-10-CM | POA: Insufficient documentation

## 2018-02-16 DIAGNOSIS — N183 Chronic kidney disease, stage 3 (moderate): Secondary | ICD-10-CM | POA: Insufficient documentation

## 2018-02-16 DIAGNOSIS — I129 Hypertensive chronic kidney disease with stage 1 through stage 4 chronic kidney disease, or unspecified chronic kidney disease: Secondary | ICD-10-CM | POA: Insufficient documentation

## 2018-02-16 DIAGNOSIS — Z85828 Personal history of other malignant neoplasm of skin: Secondary | ICD-10-CM | POA: Insufficient documentation

## 2018-02-16 DIAGNOSIS — Q6239 Other obstructive defects of renal pelvis and ureter: Secondary | ICD-10-CM | POA: Insufficient documentation

## 2018-02-16 DIAGNOSIS — N952 Postmenopausal atrophic vaginitis: Secondary | ICD-10-CM | POA: Diagnosis not present

## 2018-02-16 DIAGNOSIS — N135 Crossing vessel and stricture of ureter without hydronephrosis: Secondary | ICD-10-CM

## 2018-02-16 DIAGNOSIS — F419 Anxiety disorder, unspecified: Secondary | ICD-10-CM | POA: Diagnosis not present

## 2018-02-16 DIAGNOSIS — Z79890 Hormone replacement therapy (postmenopausal): Secondary | ICD-10-CM | POA: Insufficient documentation

## 2018-02-16 DIAGNOSIS — Z466 Encounter for fitting and adjustment of urinary device: Secondary | ICD-10-CM | POA: Insufficient documentation

## 2018-02-16 DIAGNOSIS — H409 Unspecified glaucoma: Secondary | ICD-10-CM | POA: Insufficient documentation

## 2018-02-16 DIAGNOSIS — Q6211 Congenital occlusion of ureteropelvic junction: Secondary | ICD-10-CM | POA: Diagnosis not present

## 2018-02-16 HISTORY — PX: CYSTOSCOPY W/ URETERAL STENT PLACEMENT: SHX1429

## 2018-02-16 HISTORY — PX: CYSTOSCOPY W/ RETROGRADES: SHX1426

## 2018-02-16 SURGERY — CYSTOSCOPY, FLEXIBLE, WITH STENT REPLACEMENT
Anesthesia: General | Site: Ureter | Laterality: Right | Wound class: Clean Contaminated

## 2018-02-16 MED ORDER — FAMOTIDINE 20 MG PO TABS
20.0000 mg | ORAL_TABLET | Freq: Once | ORAL | Status: AC
  Administered 2018-02-16: 20 mg via ORAL

## 2018-02-16 MED ORDER — ONDANSETRON HCL 4 MG/2ML IJ SOLN
INTRAMUSCULAR | Status: DC | PRN
  Administered 2018-02-16: 4 mg via INTRAVENOUS

## 2018-02-16 MED ORDER — FENTANYL CITRATE (PF) 100 MCG/2ML IJ SOLN: 25.0000 ug | INTRAMUSCULAR | Status: DC | PRN

## 2018-02-16 MED ORDER — ONDANSETRON HCL 4 MG/2ML IJ SOLN: 4.0000 mg | Freq: Once | INTRAMUSCULAR | Status: DC | PRN

## 2018-02-16 MED ORDER — LIDOCAINE HCL (CARDIAC) 20 MG/ML IV SOLN
INTRAVENOUS | Status: DC | PRN
  Administered 2018-02-16: 40 mg via INTRAVENOUS

## 2018-02-16 MED ORDER — FENTANYL CITRATE (PF) 100 MCG/2ML IJ SOLN
INTRAMUSCULAR | Status: DC | PRN
  Administered 2018-02-16: 25 ug via INTRAVENOUS

## 2018-02-16 MED ORDER — LACTATED RINGERS IV SOLN
INTRAVENOUS | Status: DC
  Administered 2018-02-16: 13:00:00 via INTRAVENOUS

## 2018-02-16 MED ORDER — EPHEDRINE SULFATE 50 MG/ML IJ SOLN
INTRAMUSCULAR | Status: DC | PRN
  Administered 2018-02-16 (×2): 5 mg via INTRAVENOUS

## 2018-02-16 MED ORDER — PROPOFOL 10 MG/ML IV BOLUS
INTRAVENOUS | Status: DC | PRN
  Administered 2018-02-16: 50 mg via INTRAVENOUS
  Administered 2018-02-16: 20 mg via INTRAVENOUS

## 2018-02-16 MED ORDER — CEFAZOLIN SODIUM-DEXTROSE 1-4 GM/50ML-% IV SOLN
INTRAVENOUS | Status: AC
  Filled 2018-02-16: qty 50

## 2018-02-16 MED ORDER — DEXAMETHASONE SODIUM PHOSPHATE 10 MG/ML IJ SOLN
INTRAMUSCULAR | Status: DC | PRN
  Administered 2018-02-16: 10 mg via INTRAVENOUS

## 2018-02-16 MED ORDER — FENTANYL CITRATE (PF) 100 MCG/2ML IJ SOLN
INTRAMUSCULAR | Status: AC
  Filled 2018-02-16: qty 2

## 2018-02-16 MED ORDER — FAMOTIDINE 20 MG PO TABS
ORAL_TABLET | ORAL | Status: AC
  Administered 2018-02-16: 20 mg via ORAL
  Filled 2018-02-16: qty 1

## 2018-02-16 SURGICAL SUPPLY — 22 items
BAG DRAIN CYSTO-URO LG1000N (MISCELLANEOUS) ×3 IMPLANT
BRUSH SCRUB EZ  4% CHG (MISCELLANEOUS) ×2
BRUSH SCRUB EZ 4% CHG (MISCELLANEOUS) ×1 IMPLANT
CATH URETL 5X70 OPEN END (CATHETERS) ×3 IMPLANT
CONRAY 43 FOR UROLOGY 50M (MISCELLANEOUS) ×3 IMPLANT
GLOVE BIO SURGEON STRL SZ 6.5 (GLOVE) ×2 IMPLANT
GLOVE BIO SURGEONS STRL SZ 6.5 (GLOVE) ×1
GOWN STRL REUS W/ TWL LRG LVL3 (GOWN DISPOSABLE) ×2 IMPLANT
GOWN STRL REUS W/TWL LRG LVL3 (GOWN DISPOSABLE) ×4
KIT TURNOVER CYSTO (KITS) ×3 IMPLANT
PACK CYSTO AR (MISCELLANEOUS) ×3 IMPLANT
SENSORWIRE 0.038 NOT ANGLED (WIRE) ×3
SET CYSTO W/LG BORE CLAMP LF (SET/KITS/TRAYS/PACK) ×3 IMPLANT
SOL .9 NS 3000ML IRR  AL (IV SOLUTION) ×2
SOL .9 NS 3000ML IRR UROMATIC (IV SOLUTION) ×1 IMPLANT
STENT URET 6FRX24 CONTOUR (STENTS) IMPLANT
STENT URET 6FRX26 CONTOUR (STENTS) IMPLANT
STENT URETERAL METAL 6X22 (STENTS) ×3 IMPLANT
SURGILUBE 2OZ TUBE FLIPTOP (MISCELLANEOUS) ×3 IMPLANT
SYRINGE IRR TOOMEY STRL 70CC (SYRINGE) ×3 IMPLANT
WATER STERILE IRR 1000ML POUR (IV SOLUTION) ×3 IMPLANT
WIRE SENSOR 0.038 NOT ANGLED (WIRE) ×1 IMPLANT

## 2018-02-16 NOTE — Anesthesia Procedure Notes (Signed)
Procedure Name: LMA Insertion Date/Time: 02/16/2018 1:03 PM Performed by: Marsh Dolly, CRNA Pre-anesthesia Checklist: Patient identified, Patient being monitored, Timeout performed, Emergency Drugs available and Suction available Patient Re-evaluated:Patient Re-evaluated prior to induction Oxygen Delivery Method: Circle system utilized Preoxygenation: Pre-oxygenation with 100% oxygen Induction Type: IV induction Ventilation: Mask ventilation without difficulty LMA: LMA inserted LMA Size: 4.0 Tube type: Oral Number of attempts: 1 Placement Confirmation: positive ETCO2 and breath sounds checked- equal and bilateral Tube secured with: Tape Dental Injury: Teeth and Oropharynx as per pre-operative assessment

## 2018-02-16 NOTE — Anesthesia Postprocedure Evaluation (Signed)
Anesthesia Post Note  Patient: April Green  Procedure(s) Performed: CYSTOSCOPY WITH STENT REPLACEMENT (Right Ureter) CYSTOSCOPY WITH RETROGRADE PYELOGRAM (Right Ureter)  Patient location during evaluation: PACU Anesthesia Type: General Level of consciousness: awake and alert Pain management: pain level controlled Vital Signs Assessment: post-procedure vital signs reviewed and stable Respiratory status: spontaneous breathing and respiratory function stable Cardiovascular status: stable Anesthetic complications: no     Last Vitals:  Vitals:   02/16/18 1207 02/16/18 1323  BP: (!) 170/70 (!) 145/73  Pulse: (!) 59 80  Resp: 16 13  Temp: 36.9 C (!) 36.3 C  SpO2: 99% 100%    Last Pain:  Vitals:   02/16/18 1323  TempSrc:   PainSc: 0-No pain                 KEPHART,WILLIAM K

## 2018-02-16 NOTE — Discharge Instructions (Signed)
AMBULATORY SURGERY  DISCHARGE INSTRUCTIONS   1) The drugs that you were given will stay in your system until tomorrow so for the next 24 hours you should not:  A) Drive an automobile B) Make any legal decisions C) Drink any alcoholic beverage   2) You may resume regular meals tomorrow.  Today it is better to start with liquids and gradually work up to solid foods.  You may eat anything you prefer, but it is better to start with liquids, then soup and crackers, and gradually work up to solid foods.   3) Please notify your doctor immediately if you have any unusual bleeding, trouble breathing, redness and pain at the surgery site, drainage, fever, or pain not relieved by medication.    4) Additional Instructions: Force fluids/ water especially  Please contact your physician with any problems or Same Day Surgery at [phone], Monday through Friday 6 am to 4 pm, or [place] [facility] number at [phone].

## 2018-02-16 NOTE — Op Note (Signed)
Date of procedure: 02/16/18  Preoperative diagnosis:  1. Right UPJ obstruction 2. Chronic right indwelling ureteral stent  Postoperative diagnosis:  1. Same as above  Procedure: 1. Cystoscopy 2. Right retrograde pyelogram 3. Right ureteral stent exchange with metallic stent  Surgeon: Hollice Espy, MD  Anesthesia: General  Complications: None  Intraoperative findings: Right ureteral stent exchange without difficulty today.  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 We have discussed the potential benefits and risks of the procedure, side effects of the proposed treatment, the likelihood of the patient achieving the goals of the procedure, and any potential problems that might occur during the procedure or recuperation. Informed consent has been obtained.  Description of procedure:  The patient was taken to the operating room and general anesthesia was induced. The patient was placed in the dorsal lithotomy position, prepped and draped in the usual sterile fashion, and preoperative antibiotics were administered. A preoperative time-out was performed.   A 21 French rigid cystoscopy was advanced per urethra into the bladder. Attention was turned to the right ureteral orifice from which a indwelling metal ureteral stent was identified. There is no encrustation on the stent. The remainder of the bladder appeared relatively normal with some very mild inflammatory changes on the trigone underlying the stent but no suspicious areas. The distal coil of the stent was grasped using stent graspers and it easily removed under  fluoroscopic guidance.  The open-ended ureteral catheter was placed just within the UO and a gentle retrograde pyelogram was performed which showed a decompressed ureter and upper tract collecting system.  There is no extravasation noted.  A sensor wire was then placed up to level of the kidney without difficulty. The sheath with introducer from the metal stent Was advanced up to level of the renal pelvis using the distal marker to assess its position. The inner lumen was then removed. The metal stent was then advanced through the introducer sheath up to level of the renal pelvis. A pressure was then used to create a coil within the renal pelvis and a Seldinger technique was then used to remove the sheath leaving the stent in place. A full coil was noted within the bladder once the introducer sheath was completely removed. The scope was reintroduced into the bladder to ensure adequate position of the stent and distal coil which appeared to be a full coil in satisfactory position. The bladder was then drained and the scope was removed. She was repositioned the supine position, reversed from anesthesia, and taken to the PACU in stable condition.  Plan: Patient will follow up in 11 months to arrange for her next stent exchange in one year.

## 2018-02-16 NOTE — Anesthesia Preprocedure Evaluation (Addendum)
Anesthesia Evaluation  Patient identified by MRN, date of birth, ID band Patient awake    Reviewed: Allergy & Precautions, NPO status , Patient's Chart, lab work & pertinent test results  History of Anesthesia Complications (+) PONV and history of anesthetic complications  Airway Mallampati: III       Dental  (+) Poor Dentition, Chipped   Pulmonary neg sleep apnea, neg COPD,           Cardiovascular hypertension, Pt. on medications (-) Past MI and (-) CHF (-) dysrhythmias (-) Valvular Problems/Murmurs     Neuro/Psych neg Seizures Anxiety    GI/Hepatic Neg liver ROS, neg GERD  ,  Endo/Other  neg diabetes  Renal/GU      Musculoskeletal   Abdominal   Peds  Hematology   Anesthesia Other Findings   Reproductive/Obstetrics                            Anesthesia Physical Anesthesia Plan  ASA: III  Anesthesia Plan: General   Post-op Pain Management:    Induction: Intravenous  PONV Risk Score and Plan: 4 or greater and Dexamethasone, Ondansetron, Midazolam and Treatment may vary due to age or medical condition  Airway Management Planned: LMA  Additional Equipment:   Intra-op Plan:   Post-operative Plan:   Informed Consent: I have reviewed the patients History and Physical, chart, labs and discussed the procedure including the risks, benefits and alternatives for the proposed anesthesia with the patient or authorized representative who has indicated his/her understanding and acceptance.     Plan Discussed with:   Anesthesia Plan Comments:         Anesthesia Quick Evaluation

## 2018-02-16 NOTE — Interval H&P Note (Signed)
History and Physical Interval Note:  02/16/2018 12:37 PM  April Green [name]  has presented today for surgery, with the diagnosis of right upj obstruction   The various methods of treatment have been discussed with the patient and family. After consideration of risks, benefits and other options for treatment, the patient has consented to  Procedure(s): CYSTOSCOPY WITH STENT REPLACEMENT (Right) as a surgical intervention .  The patient's history has been reviewed, patient examined, no change in status, stable for surgery.  I have reviewed the patient's chart and labs.  Questions were answered to the patient's satisfaction.    RRR CTAB  Hollice Espy

## 2018-02-16 NOTE — Anesthesia Post-op Follow-up Note (Signed)
Anesthesia QCDR form completed.        

## 2018-02-16 NOTE — Transfer of Care (Signed)
Immediate Anesthesia Transfer of Care Note  Patient: April Green  Procedure(s) Performed: CYSTOSCOPY WITH STENT REPLACEMENT (Right Ureter) CYSTOSCOPY WITH RETROGRADE PYELOGRAM (Right Ureter)  Patient Location: PACU  Anesthesia Type:General  Level of Consciousness: awake, alert  and oriented  Airway & Oxygen Therapy: Patient Spontanous Breathing and Patient connected to face mask oxygen  Post-op Assessment: Report given to RN and Post -op Vital signs reviewed and stable  Post vital signs: Reviewed and stable  Last Vitals:  Vitals Value Taken Time  BP 145/73 02/16/2018  1:23 PM  Temp 36.3 C 02/16/2018  1:23 PM  Pulse 80 02/16/2018  1:23 PM  Resp 13 02/16/2018  1:23 PM  SpO2 100 % 02/16/2018  1:23 PM    Last Pain:  Vitals:   02/16/18 1323  TempSrc:   PainSc: 0-No pain         Complications: No apparent anesthesia complications

## 2018-02-17 ENCOUNTER — Encounter: Payer: Self-pay | Admitting: Urology

## 2018-07-30 ENCOUNTER — Other Ambulatory Visit: Payer: Self-pay | Admitting: Family Medicine

## 2018-07-30 DIAGNOSIS — N952 Postmenopausal atrophic vaginitis: Secondary | ICD-10-CM

## 2018-07-30 MED ORDER — ESTRADIOL 0.1 MG/GM VA CREA: TOPICAL_CREAM | VAGINAL | 12 refills | Status: DC

## 2018-09-08 ENCOUNTER — Emergency Department: Payer: Medicare Other

## 2018-09-08 ENCOUNTER — Encounter: Payer: Self-pay | Admitting: *Deleted

## 2018-09-08 ENCOUNTER — Emergency Department
Admission: EM | Admit: 2018-09-08 | Discharge: 2018-09-08 | Disposition: A | Discharge status: Home / Self Care | Payer: Medicare Other | Attending: Emergency Medicine | Admitting: Emergency Medicine

## 2018-09-08 ENCOUNTER — Other Ambulatory Visit: Payer: Self-pay

## 2018-09-08 DIAGNOSIS — Z23 Encounter for immunization: Secondary | ICD-10-CM | POA: Diagnosis not present

## 2018-09-08 DIAGNOSIS — I129 Hypertensive chronic kidney disease with stage 1 through stage 4 chronic kidney disease, or unspecified chronic kidney disease: Secondary | ICD-10-CM | POA: Diagnosis not present

## 2018-09-08 DIAGNOSIS — Y9301 Activity, walking, marching and hiking: Secondary | ICD-10-CM | POA: Diagnosis not present

## 2018-09-08 DIAGNOSIS — Z79899 Other long term (current) drug therapy: Secondary | ICD-10-CM | POA: Insufficient documentation

## 2018-09-08 DIAGNOSIS — S0083XA Contusion of other part of head, initial encounter: Secondary | ICD-10-CM | POA: Insufficient documentation

## 2018-09-08 DIAGNOSIS — Y999 Unspecified external cause status: Secondary | ICD-10-CM | POA: Insufficient documentation

## 2018-09-08 DIAGNOSIS — Y929 Unspecified place or not applicable: Secondary | ICD-10-CM | POA: Insufficient documentation

## 2018-09-08 DIAGNOSIS — S0990XA Unspecified injury of head, initial encounter: Secondary | ICD-10-CM | POA: Diagnosis present

## 2018-09-08 DIAGNOSIS — N184 Chronic kidney disease, stage 4 (severe): Secondary | ICD-10-CM | POA: Diagnosis not present

## 2018-09-08 DIAGNOSIS — S0101XA Laceration without foreign body of scalp, initial encounter: Secondary | ICD-10-CM | POA: Insufficient documentation

## 2018-09-08 DIAGNOSIS — W2209XA Striking against other stationary object, initial encounter: Secondary | ICD-10-CM | POA: Diagnosis not present

## 2018-09-08 DIAGNOSIS — W19XXXA Unspecified fall, initial encounter: Secondary | ICD-10-CM

## 2018-09-08 MED ORDER — LIDOCAINE-EPINEPHRINE 2 %-1:100000 IJ SOLN
INTRAMUSCULAR | Status: AC
  Filled 2018-09-08: qty 1

## 2018-09-08 MED ORDER — LIDOCAINE-EPINEPHRINE 2 %-1:100000 IJ SOLN
20.0000 mL | Freq: Once | INTRAMUSCULAR | Status: AC
  Administered 2018-09-08: 8 mL via INTRADERMAL

## 2018-09-08 MED ORDER — LIDOCAINE-EPINEPHRINE (PF) 2 %-1:200000 IJ SOLN
20.0000 mL | Freq: Once | INTRAMUSCULAR | Status: DC
  Filled 2018-09-08: qty 20

## 2018-09-08 MED ORDER — TETANUS-DIPHTH-ACELL PERTUSSIS 5-2.5-18.5 LF-MCG/0.5 IM SUSP
0.5000 mL | Freq: Once | INTRAMUSCULAR | Status: AC
  Administered 2018-09-08: 0.5 mL via INTRAMUSCULAR
  Filled 2018-09-08: qty 0.5

## 2018-09-08 MED ORDER — BACITRACIN ZINC 500 UNIT/GM EX OINT
TOPICAL_OINTMENT | CUTANEOUS | Status: AC
  Administered 2018-09-08: 1 via TOPICAL
  Filled 2018-09-08: qty 0.9

## 2018-09-08 NOTE — Discharge Instructions (Signed)
The laceration over her forehead was repaired with absorbable sutures which will dissolve on their own.  We gave you a tetanus shot today.  Please follow-up with your doctor in 2 days for recheck of your wounds to ensure they are not getting infected.  CT scans of your head and neck today did not show any acute injuries.

## 2018-09-08 NOTE — ED Notes (Signed)
Patient taken to imaging. Patient's niece is at bedside. Patient's left cheek was cleaned with a warm, wet cloth. Will address the forehead wound when patient returns.

## 2018-09-08 NOTE — ED Provider Notes (Signed)
[facility] Emergency Department Provider Note  ____________________________________________  Time seen: Approximately 2:07 PM  I have reviewed the triage vital signs and the nursing notes.   HISTORY  Chief Complaint Fall    HPI April Green is a 84 y.o. female with a history of CKD, hypertension who reports that she was going into her house today when she tripped over the threshold and hit the door frame with her face.  She fell on the ground was able to get herself back up.  Complains of forehead pain, but denies loss of consciousness vomiting vision changes or dizziness.  No neck pain or other pain complaints.  No paresthesias or weakness.  She does not take blood thinners.  She is unable to recall her last tetanus shot.      Past Medical History:  Diagnosis Date  . Acute cystitis   . Anxiety   . Cancer ([place])    skin cancer  . Chronic UTI   . Glaucoma   . Hydronephrosis    stage IV ckd  . Hypertension   . Incomplete bladder emptying   . Macular degeneration   . PONV (postoperative nausea and vomiting)    nausea only  . Renal cyst   . SUI (stress urinary incontinence, female)   . UPJ obstruction, congenital   . Vertigo      Patient Active Problem List   Diagnosis Date Noted  . Recurrent UTI 11/07/2015  . Atrophic vaginitis 11/07/2015  . Urinary tract infectious disease 10/19/2015  . UPJ obstruction, congenital 10/19/2015     Past Surgical History:  Procedure Laterality Date  . ABDOMINAL HYSTERECTOMY    . cysto stent exchange     once a year for last 6 years. metal stent  . CYSTOSCOPY W/ RETROGRADES Right 02/16/2018   Procedure: CYSTOSCOPY WITH RETROGRADE PYELOGRAM;  Surgeon: Hollice Espy, MD;  Location: [facility] ORS;  Service: Urology;  Laterality: Right;  . CYSTOSCOPY W/ URETERAL STENT PLACEMENT Right 01/01/2016   Procedure: CYSTOSCOPY WITH STENT REPLACEMENT;  Surgeon: Hollice Espy, MD;  Location: [facility] ORS;  Service: Urology;   Laterality: Right;  . CYSTOSCOPY W/ URETERAL STENT PLACEMENT Right 02/18/2017   Procedure: CYSTOSCOPY WITH STENT REPLACEMENT;  Surgeon: Hollice Espy, MD;  Location: [facility] ORS;  Service: Urology;  Laterality: Right;  . CYSTOSCOPY W/ URETERAL STENT PLACEMENT Right 02/16/2018   Procedure: CYSTOSCOPY WITH STENT REPLACEMENT;  Surgeon: Hollice Espy, MD;  Location: [facility] ORS;  Service: Urology;  Laterality: Right;  . EYE SURGERY Bilateral    cataract extractions  . HERNIA REPAIR Left 05/2008   inguinal hernia     Prior to Admission medications   Medication Sig Start Date End Date Taking? Authorizing Provider  acetaminophen (TYLENOL) 325 MG tablet Take 325-650 mg by mouth every 6 (six) hours as needed for moderate pain.    [provider]  amLODipine (NORVASC) 10 MG tablet Take 10 mg by mouth at bedtime.  03/06/15   [provider]  citalopram (CELEXA) 10 MG tablet Take 5 mg by mouth at bedtime 01/12/18   [provider]  conjugated estrogens (PREMARIN) vaginal cream Place 1 Applicatorful vaginally daily. Use pea sized amount M-W-Fr before bedtime Patient not taking: Reported on 01/30/2018 01/23/18   Hollice Espy, MD  diazepam (VALIUM) 2 MG tablet Take 2 mg by mouth every 8 (eight) hours as needed (Vertigo).  01/10/15   [provider]  estradiol (ESTRACE VAGINAL) 0.1 MG/GM vaginal cream Apply 0.5mg  (pea-sized amount)  just inside  the vaginal introitus with a finger-tip every night for two weeks and then Monday, Wednesday and Friday nights. 07/30/18   Hollice Espy, MD  Garlic 967 MG TABS Take 100 mg by mouth daily.     [provider]  latanoprost (XALATAN) 0.005 % ophthalmic solution Place 1 drop into both eyes at bedtime.     [provider]  losartan (COZAAR) 25 MG tablet Take 25 mg by mouth in the morning 09/11/15   [provider]  Melatonin 3 MG TABS Take 3 mg by mouth at bedtime.     [provider]  Multiple  Vitamins-Minerals (MULTIVITAMIN PO) Take 1 tablet by mouth daily.    [provider]  Multiple Vitamins-Minerals (PRESERVISION AREDS PO) Take 1 tablet by mouth 2 (two) times daily.     [provider]  Omega-3 Fatty Acids (FISH OIL) 1000 MG CAPS Take 1,000 mg by mouth daily.     [provider]  timolol (BETIMOL) 0.5 % ophthalmic solution Place 1 drop into both eyes daily.    [provider]  triamcinolone cream (KENALOG) 0.1 % Apply 1 application topically 2 (two) times daily as needed (for psoriasis).     [provider]     Allergies Sulfa antibiotics; Amoxicillin-pot clavulanate; Azithromycin; Trazodone and nefazodone; 5-methoxypsoralen; Aspirin; Ceftriaxone; Diphenhydramine; Flexeril [cyclobenzaprine]; Levofloxacin; and Oxycodone   Family History  Problem Relation Age of Onset  . Kidney disease Neg Hx   . Bladder Cancer Neg Hx   . Breast cancer Neg Hx     Social History Social History   Tobacco Use  . Smoking status: Never Smoker  . Smokeless tobacco: Never Used  Substance Use Topics  . Alcohol use: No    Alcohol/week: 0.0 standard drinks  . Drug use: No    Review of Systems  Constitutional:   No fever or chills.  ENT:   No sore throat. No rhinorrhea. Cardiovascular:   No chest pain or syncope. Respiratory:   No dyspnea or cough. Gastrointestinal:   Negative for abdominal pain, vomiting and diarrhea.  Musculoskeletal:   Negative for focal pain or swelling All other systems reviewed and are negative except as documented above in ROS and HPI.  ____________________________________________   PHYSICAL EXAM:  VITAL SIGNS: ED Triage Vitals  Enc Vitals Group     BP 09/08/18 1315 (!) 162/91     Pulse Rate 09/08/18 1315 63     Resp 09/08/18 1315 18     Temp 09/08/18 1315 97.7 F (36.5 C)     Temp Source 09/08/18 1315 Oral     SpO2 09/08/18 1311 96 %     Weight 09/08/18 1320 83 lb (37.6 kg)     Height 09/08/18 1320 5'  (1.524 m)     Head Circumference --      Peak Flow --      Pain Score 09/08/18 1318 0     Pain Loc --      Pain Edu? --      Excl. in [place]? --     Vital signs reviewed, nursing assessments reviewed.   Constitutional:   Alert and oriented. Non-toxic appearance. Eyes:   Conjunctivae are normal. EOMI. PERRL. ENT      Head:   Normocephalic with 4 cm curvilinear laceration over the central forehead.  No foreign bodies.  No raccoon eyes or battle signs or hemotympanum.  There is a 1 cm well approximated skin tear and small subcutaneous hematoma over the left maxilla  without underlying bony tenderness.  Hemostatic.  No laceration through the dermis.      Nose:   No congestion/rhinnorhea.  No epistaxis or septal hematoma      Mouth/Throat:   MMM, no pharyngeal erythema. No peritonsillar mass.  No intraoral injuries      Neck:   No meningismus. Full ROM.  No midline spinal tenderness Hematological/Lymphatic/Immunilogical:   No cervical lymphadenopathy. Cardiovascular:   RRR. Symmetric bilateral radial and DP pulses.  No murmurs. Cap refill less than 2 seconds. Respiratory:   Normal respiratory effort without tachypnea/retractions. Breath sounds are clear and equal bilaterally. No wheezes/rales/rhonchi. Gastrointestinal:   Soft and nontender. Non distended. There is no CVA tenderness.  No rebound, rigidity, or guarding. Musculoskeletal:   Normal range of motion in all extremities. No joint effusions.  No lower extremity tenderness.  No edema. Neurologic:   Normal speech and language.  Motor grossly intact. No acute focal neurologic deficits are appreciated.  Skin:    Skin is warm, dry and intact. No rash noted.  No petechiae, purpura, or bullae.  ____________________________________________    LABS (pertinent positives/negatives) (all labs ordered are listed, but only abnormal results are displayed) Labs Reviewed - No data to  display ____________________________________________   EKG    ____________________________________________    RADIOLOGY  Ct Head Wo Contrast  Result Date: 09/08/2018 CLINICAL DATA:  Tripped and hit head. EXAM: CT HEAD WITHOUT CONTRAST CT CERVICAL SPINE WITHOUT CONTRAST TECHNIQUE: Multidetector CT imaging of the head and cervical spine was performed following the standard protocol without intravenous contrast. Multiplanar CT image reconstructions of the cervical spine were also generated. COMPARISON:  None. FINDINGS: CT HEAD FINDINGS Brain: Age related cerebral atrophy, ventriculomegaly and periventricular white matter disease. No acute intracranial findings such as hemispheric infarction or intracranial hemorrhage. No extra-axial fluid collections are identified. The brainstem and cerebellum are grossly normal. Vascular: No hyperdense vessel or unexpected calcification. Skull: No skull fracture or bone lesion. Sinuses/Orbits: The paranasal sinuses and mastoid air cells are clear. The globes are intact. Scleral bands are noted bilaterally. Other: There is a small left frontal scalp hematoma and possible laceration. No radiopaque foreign body. No underlying skull fracture. CT CERVICAL SPINE FINDINGS Alignment: Normal. Skull base and vertebrae: No acute fracture. No primary bone lesion or focal pathologic process. Soft tissues and spinal canal: No prevertebral fluid or swelling. No visible canal hematoma. Disc levels: No large disc protrusions, spinal or foraminal stenosis. Upper chest: The lung apices are grossly clear. Other: None IMPRESSION: 1. No acute intracranial findings. 2. Left frontal scalp hematoma but no underlying skull fracture. 3. Normal alignment of the cervical vertebral bodies and no acute cervical spine fracture. Electronically Signed   By: Marijo Sanes M.D.   On: 09/08/2018 13:50   Ct Cervical Spine Wo Contrast  Result Date: 09/08/2018 CLINICAL DATA:  Tripped and hit head. EXAM:  CT HEAD WITHOUT CONTRAST CT CERVICAL SPINE WITHOUT CONTRAST TECHNIQUE: Multidetector CT imaging of the head and cervical spine was performed following the standard protocol without intravenous contrast. Multiplanar CT image reconstructions of the cervical spine were also generated. COMPARISON:  None. FINDINGS: CT HEAD FINDINGS Brain: Age related cerebral atrophy, ventriculomegaly and periventricular white matter disease. No acute intracranial findings such as hemispheric infarction or intracranial hemorrhage. No extra-axial fluid collections are identified. The brainstem and cerebellum are grossly normal. Vascular: No hyperdense vessel or unexpected calcification. Skull: No skull fracture or bone lesion. Sinuses/Orbits: The paranasal sinuses and mastoid air cells are clear. The globes  are intact. Scleral bands are noted bilaterally. Other: There is a small left frontal scalp hematoma and possible laceration. No radiopaque foreign body. No underlying skull fracture. CT CERVICAL SPINE FINDINGS Alignment: Normal. Skull base and vertebrae: No acute fracture. No primary bone lesion or focal pathologic process. Soft tissues and spinal canal: No prevertebral fluid or swelling. No visible canal hematoma. Disc levels: No large disc protrusions, spinal or foraminal stenosis. Upper chest: The lung apices are grossly clear. Other: None IMPRESSION: 1. No acute intracranial findings. 2. Left frontal scalp hematoma but no underlying skull fracture. 3. Normal alignment of the cervical vertebral bodies and no acute cervical spine fracture. Electronically Signed   By: Marijo Sanes M.D.   On: 09/08/2018 13:50    ____________________________________________   PROCEDURES .Marland KitchenLaceration Repair Date/Time: 09/08/2018 2:51 PM Performed by: Carrie Mew, MD Authorized by: Carrie Mew, MD   Consent:    Consent obtained:  Verbal   Consent given by:  Patient   Risks discussed:  Infection, pain, retained foreign body,  poor cosmetic result and poor wound healing Anesthesia (see MAR for exact dosages):    Anesthesia method:  Local infiltration   Local anesthetic:  Lidocaine 2% WITH epi Laceration details:    Location:  Scalp   Scalp location:  Frontal   Length (cm):  5 Repair type:    Repair type:  Complex Pre-procedure details:    Preparation:  Patient was prepped and draped in usual sterile fashion and imaging obtained to evaluate for foreign bodies Exploration:    Limited defect created (wound extended): no     Hemostasis achieved with:  Direct pressure   Wound exploration: entire depth of wound probed and visualized     Wound extent: no foreign bodies/material noted and no vascular damage noted     Contaminated: no   Treatment:    Area cleansed with:  Saline and Betadine   Amount of cleaning:  Extensive   Irrigation solution:  Sterile saline   Irrigation method:  Pressure wash   Visualized foreign bodies/material removed: no     Debridement:  None   Undermining:  Minimal   Scar revision: no   Skin repair:    Repair method:  Sutures   Suture size:  4-0   Wound skin closure material used: monocryl.   Suture technique:  Running   Number of sutures:  7 Approximation:    Approximation:  Close Post-procedure details:    Dressing:  Sterile dressing and antibiotic ointment   Patient tolerance of procedure:  Tolerated well, no immediate complications    ____________________________________________  DIFFERENTIAL DIAGNOSIS   Subdural hematoma, epidural hematoma, cervical spine fracture  CLINICAL IMPRESSION / ASSESSMENT AND PLAN / ED COURSE  Pertinent labs & imaging results that were available during my care of the patient were reviewed by me and considered in my medical decision making (see chart for details).    Patient is nontoxic, vital signs unremarkable, presents with a mechanical fall resulting in head trauma.  Due to her advanced age, CT scans were obtained to assess for  intracranial hemorrhage or C-spine fracture.  These are negative for any acute injury.  Doubt ligamentous injury.  Tetanus updated.  I will provide wound care and laceration repair, and then patient can be discharged home to follow-up with primary care.      ____________________________________________   FINAL CLINICAL IMPRESSION(S) / ED DIAGNOSES    Final diagnoses:  Laceration of scalp, initial encounter  Fall, initial encounter  Contusion of  face, initial encounter     ED Discharge Orders    None      Portions of this note were generated with dragon dictation software. Dictation errors may occur despite best attempts at proofreading.    Carrie Mew, MD 09/08/18 [phone]

## 2018-09-08 NOTE — ED Triage Notes (Signed)
Per EMS report, patient tripped over the threshold of doorway and hit the door frame. Patient was able to stand up on her own and called her niece who called the EMS. Patient denies LOC, nausea/vomiting or dizziness. Patient was ambulatory at the scene. Patient has a hematoma on forehead with a skin tear.

## 2018-09-08 NOTE — ED Notes (Signed)
Patient's wounds cleaned and dressed with gauze at this time. Dr. Joni Fears at bedside.

## 2019-01-01 ENCOUNTER — Emergency Department
Admission: EM | Admit: 2019-01-01 | Discharge: 2019-01-01 | Disposition: A | Discharge status: Home / Self Care | Payer: Medicare Other | Attending: Student in an Organized Health Care Education/Training Program | Admitting: Student in an Organized Health Care Education/Training Program

## 2019-01-01 ENCOUNTER — Emergency Department: Payer: Medicare Other

## 2019-01-01 ENCOUNTER — Encounter: Payer: Self-pay | Admitting: *Deleted

## 2019-01-01 ENCOUNTER — Other Ambulatory Visit: Payer: Self-pay

## 2019-01-01 DIAGNOSIS — Y929 Unspecified place or not applicable: Secondary | ICD-10-CM | POA: Insufficient documentation

## 2019-01-01 DIAGNOSIS — Y999 Unspecified external cause status: Secondary | ICD-10-CM | POA: Diagnosis not present

## 2019-01-01 DIAGNOSIS — S72102A Unspecified trochanteric fracture of left femur, initial encounter for closed fracture: Secondary | ICD-10-CM

## 2019-01-01 DIAGNOSIS — Z79899 Other long term (current) drug therapy: Secondary | ICD-10-CM | POA: Diagnosis not present

## 2019-01-01 DIAGNOSIS — Y9389 Activity, other specified: Secondary | ICD-10-CM | POA: Insufficient documentation

## 2019-01-01 DIAGNOSIS — W010XXA Fall on same level from slipping, tripping and stumbling without subsequent striking against object, initial encounter: Secondary | ICD-10-CM | POA: Diagnosis not present

## 2019-01-01 DIAGNOSIS — I10 Essential (primary) hypertension: Secondary | ICD-10-CM | POA: Diagnosis not present

## 2019-01-01 DIAGNOSIS — S72112A Displaced fracture of greater trochanter of left femur, initial encounter for closed fracture: Secondary | ICD-10-CM | POA: Diagnosis not present

## 2019-01-01 DIAGNOSIS — S79912A Unspecified injury of left hip, initial encounter: Secondary | ICD-10-CM | POA: Diagnosis present

## 2019-01-01 MED ORDER — PSEUDOEPH-BROMPHEN-DM 30-2-10 MG/5ML PO SYRP: 5.0000 mL | ORAL_SOLUTION | Freq: Four times a day (QID) | ORAL | 0 refills | Status: DC | PRN

## 2019-01-01 MED ORDER — LIDOCAINE 5 % EX PTCH
1.0000 | MEDICATED_PATCH | CUTANEOUS | Status: DC
  Filled 2019-01-01: qty 1

## 2019-01-01 MED ORDER — LIDOCAINE 5 % EX PTCH
1.0000 | MEDICATED_PATCH | CUTANEOUS | Status: DC
  Administered 2019-01-01: 1 via TRANSDERMAL
  Filled 2019-01-01: qty 1

## 2019-01-01 MED ORDER — LIDOCAINE 5 % EX PTCH: 1.0000 | MEDICATED_PATCH | Freq: Two times a day (BID) | CUTANEOUS | 0 refills | Status: DC

## 2019-01-01 MED ORDER — DOXYCYCLINE MONOHYDRATE 100 MG PO CAPS: 100.0000 mg | ORAL_CAPSULE | Freq: Two times a day (BID) | ORAL | 0 refills | Status: DC

## 2019-01-01 NOTE — Discharge Instructions (Addendum)
Ambulate with walker until evaluation with orthopedics.  Call clinic Monday morning at 830 and tell them you are a follow-up from the emergency room.  They will tell you time to come in to be evaluated.  Return back to ED if your pain increases from baseline of today.  Use Lidoderm patches every 12 hours until evaluation by orthopedics.

## 2019-01-01 NOTE — ED Notes (Signed)
Pt ambulating with walker with little to no assistance from this writer, pt states that she only "really feels pain when she takes a step with that leg" PA Ron Tamala Julian at pt bedside and agrees to discharge pt, pt sitting in a WC @ this time awaiting Dc instructions

## 2019-01-01 NOTE — ED Notes (Signed)
Family at bedside. 

## 2019-01-01 NOTE — ED Notes (Signed)

## 2019-01-01 NOTE — ED Provider Notes (Signed)
[facility] Emergency Department Provider Note   ____________________________________________   First MD Initiated Contact with Patient 01/01/19 1559     (approximate)  I have reviewed the triage vital signs and the nursing notes.   HISTORY  Chief Complaint Fall and Hip Pain    HPI April Green is a 85 y.o. female patient presents with left hip pain secondary to a fall.  Patient that this is a mechanical fall when she bent over to pick up something.  Patient denies dizziness or lightheadedness prior to the fall.  Patient denies shortness of breath or chest pain.  Patient denies LOC or head injury.  Patient state ambulation increases pain.  No palliative measures prior to arrival.  Patient described the pain as "achy".   Past Medical History:  Diagnosis Date  . Acute cystitis   . Anxiety   . Cancer ([place])    skin cancer  . Chronic UTI   . Glaucoma   . Hydronephrosis    stage IV ckd  . Hypertension   . Incomplete bladder emptying   . Macular degeneration   . PONV (postoperative nausea and vomiting)    nausea only  . Renal cyst   . SUI (stress urinary incontinence, female)   . UPJ obstruction, congenital   . Vertigo     Patient Active Problem List   Diagnosis Date Noted  . Recurrent UTI 11/07/2015  . Atrophic vaginitis 11/07/2015  . Urinary tract infectious disease 10/19/2015  . UPJ obstruction, congenital 10/19/2015    Past Surgical History:  Procedure Laterality Date  . ABDOMINAL HYSTERECTOMY    . cysto stent exchange     once a year for last 6 years. metal stent  . CYSTOSCOPY W/ RETROGRADES Right 02/16/2018   Procedure: CYSTOSCOPY WITH RETROGRADE PYELOGRAM;  Surgeon: Hollice Espy, MD;  Location: [facility] ORS;  Service: Urology;  Laterality: Right;  . CYSTOSCOPY W/ URETERAL STENT PLACEMENT Right 01/01/2016   Procedure: CYSTOSCOPY WITH STENT REPLACEMENT;  Surgeon: Hollice Espy, MD;  Location: [facility] ORS;  Service: Urology;  Laterality:  Right;  . CYSTOSCOPY W/ URETERAL STENT PLACEMENT Right 02/18/2017   Procedure: CYSTOSCOPY WITH STENT REPLACEMENT;  Surgeon: Hollice Espy, MD;  Location: [facility] ORS;  Service: Urology;  Laterality: Right;  . CYSTOSCOPY W/ URETERAL STENT PLACEMENT Right 02/16/2018   Procedure: CYSTOSCOPY WITH STENT REPLACEMENT;  Surgeon: Hollice Espy, MD;  Location: [facility] ORS;  Service: Urology;  Laterality: Right;  . EYE SURGERY Bilateral    cataract extractions  . HERNIA REPAIR Left 05/2008   inguinal hernia    Prior to Admission medications   Medication Sig Start Date End Date Taking? Authorizing Provider  acetaminophen (TYLENOL) 325 MG tablet Take 325-650 mg by mouth every 6 (six) hours as needed for moderate pain.    [provider]  amLODipine (NORVASC) 10 MG tablet Take 10 mg by mouth at bedtime.  03/06/15   [provider]  brompheniramine-pseudoephedrine-DM 30-2-10 MG/5ML syrup Take 5 mLs by mouth 4 (four) times daily as needed. 01/01/19   Sable Feil, PA-C  citalopram (CELEXA) 10 MG tablet Take 5 mg by mouth at bedtime 01/12/18   [provider]  conjugated estrogens (PREMARIN) vaginal cream Place 1 Applicatorful vaginally daily. Use pea sized amount M-W-Fr before bedtime Patient not taking: Reported on 01/30/2018 01/23/18   Hollice Espy, MD  diazepam (VALIUM) 2 MG tablet Take 2 mg by mouth every 8 (eight) hours as needed (Vertigo).  01/10/15   [provider]  doxycycline (MONODOX) 100 MG capsule Take 1 capsule (100 mg total) by mouth 2 (two) times daily. 01/01/19   Sable Feil, PA-C  estradiol (ESTRACE VAGINAL) 0.1 MG/GM vaginal cream Apply 0.5mg  (pea-sized amount)  just inside the vaginal introitus with a finger-tip every night for two weeks and then Monday, Wednesday and Friday nights. 07/30/18   Hollice Espy, MD  Garlic 970 MG TABS Take 100 mg by mouth daily.     [provider]  latanoprost (XALATAN) 0.005 % ophthalmic solution Place 1 drop into  both eyes at bedtime.     [provider]  lidocaine (LIDODERM) 5 % Place 1 patch onto the skin every 12 (twelve) hours. Remove & Discard patch within 12 hours or as directed by MD 01/01/19 01/01/20  Sable Feil, PA-C  losartan (COZAAR) 25 MG tablet Take 25 mg by mouth in the morning 09/11/15   [provider]  Melatonin 3 MG TABS Take 3 mg by mouth at bedtime.     [provider]  Multiple Vitamins-Minerals (MULTIVITAMIN PO) Take 1 tablet by mouth daily.    [provider]  Multiple Vitamins-Minerals (PRESERVISION AREDS PO) Take 1 tablet by mouth 2 (two) times daily.     [provider]  Omega-3 Fatty Acids (FISH OIL) 1000 MG CAPS Take 1,000 mg by mouth daily.     [provider]  timolol (BETIMOL) 0.5 % ophthalmic solution Place 1 drop into both eyes daily.    [provider]  triamcinolone cream (KENALOG) 0.1 % Apply 1 application topically 2 (two) times daily as needed (for psoriasis).     [provider]    Allergies Sulfa antibiotics; Amoxicillin-pot clavulanate; Azithromycin; Trazodone and nefazodone; 5-methoxypsoralen; Aspirin; Ceftriaxone; Diphenhydramine; Flexeril [cyclobenzaprine]; Levofloxacin; and Oxycodone  Family History  Problem Relation Age of Onset  . Kidney disease Neg Hx   . Bladder Cancer Neg Hx   . Breast cancer Neg Hx     Social History Social History   Tobacco Use  . Smoking status: Never Smoker  . Smokeless tobacco: Never Used  Substance Use Topics  . Alcohol use: No    Alcohol/week: 0.0 standard drinks  . Drug use: No    Review of Systems Constitutional: No fever/chills Eyes: No visual changes. ENT: No sore throat. Cardiovascular: Denies chest pain. Respiratory: Denies shortness of breath. Gastrointestinal: No abdominal pain.  No nausea, no vomiting.  No diarrhea.  No constipation. Genitourinary: Negative for dysuria. Musculoskeletal: Left hip pain.   Skin: Negative for  rash. Neurological: Negative for headaches, focal weakness or numbness. Psychiatric:  Anxiety Endocrine:  Hypertension Allergic/Immunilogical: See extensive allergy list. ____________________________________________   PHYSICAL EXAM:  VITAL SIGNS: ED Triage Vitals  Enc Vitals Group     BP      Pulse      Resp      Temp      Temp src      SpO2      Weight      Height      Head Circumference      Peak Flow      Pain Score      Pain Loc      Pain Edu?      Excl. in [place]?    Constitutional: Alert and oriented. Well appearing and in no acute distress. Neck: No cervical spine tenderness to palpation. Cardiovascular: Normal rate, regular rhythm. Grossly normal heart sounds.  Good peripheral circulation. Respiratory: Normal respiratory effort.  No retractions.  Lungs CTAB. Gastrointestinal: Soft and nontender. No distention. No abdominal bruits. No CVA tenderness. Musculoskeletal: No obvious deformity to the left hip.  No leg length discrepancy.  Moderate guarding palpation lateral left hip.  Also increased guarding with flexion of the hip.  Neurologic:  Normal speech and language. No gross focal neurologic deficits are appreciated. No gait instability. Skin:  Skin is warm, dry and intact. No rash noted. Psychiatric: Mood and affect are normal. Speech and behavior are normal.  ____________________________________________   LABS (all labs ordered are listed, but only abnormal results are displayed)  Labs Reviewed - No data to display ____________________________________________  EKG   ____________________________________________  RADIOLOGY  ED MD interpretation:    Official radiology report(s): Ct Hip Left Wo Contrast  Result Date: 01/01/2019 CLINICAL DATA:  Status post fall, left hip pain EXAM: CT OF THE LEFT HIP WITHOUT CONTRAST TECHNIQUE: Multidetector CT imaging of the left hip was performed according to the standard protocol. Multiplanar CT image reconstructions were  also generated. COMPARISON:  None. FINDINGS: Bones/Joint/Cartilage Generalized osteopenia. Isolated left greater trochanteric fracture with mild comminution. Fracture does not extend into the intertrochanteric region or to the femoral neck. No hip dislocation. No other fracture or dislocation. No periosteal reaction or bone destruction. Degenerative changes of the pubic symphysis. Left hip joint space is maintained. Ligaments Suboptimally assessed by CT. Muscles and Tendons Muscles are normal.  No muscle atrophy. Soft tissues No fluid collection or hematoma. No soft tissue mass. Prior left inguinal hernia repair. IMPRESSION: 1. Isolated mildly comminuted left greater trochanteric fracture. Fracture does not extend into the intertrochanteric region or into the femoral neck. Electronically Signed   By: Kathreen Devoid   On: 01/01/2019 17:59   Dg Hip Unilat W Or Wo Pelvis 2-3 Views Left  Result Date: 01/01/2019 CLINICAL DATA:  Left hip pain following a fall at home. EXAM: DG HIP (WITH OR WITHOUT PELVIS) 2-3V LEFT COMPARISON:  None. FINDINGS: There is a possible nondisplaced fracture through the superior aspect of the greater trochanter on the left. Otherwise, no fracture or dislocation is seen. A right ureteral stent is noted as well as left pelvic hernia repair mesh. Diffuse osteopenia. Mild lower lumbar spine degenerative changes. IMPRESSION: Possible nondisplaced fracture through the superior aspect of the left greater trochanter. Electronically Signed   By: Claudie Revering M.D.   On: 01/01/2019 16:46    ____________________________________________   PROCEDURES  Procedure(s) performed (including Critical Care):  Procedures   ____________________________________________   INITIAL IMPRESSION / ASSESSMENT AND PLAN / ED COURSE  As part of my medical decision making, I reviewed the following data within the [facility]     Patient arrived via EMS with left hip pain secondary to fall.   Patient initially after the fall she was able to weight-bear but became increasingly more painful.  X-ray CT scan shows a nondisplaced fracture of the left greater trochanter.  Discussed patient with Dr. Mack Guise.  Patient was given a trial ambulating with walker.  Patient tolerated ambulating with complaint of pain to the left hip.  Discussed patient option of being admitted to the hospital or go home.  Patient elected to go home on a trial basis.  Patient states she is noted decreased pain status post application of a Lidoderm patch.  Advised patient if pain increased from her baseline of today to return back to the ED. patient voices understanding of discharge instructions.     ____________________________________________   FINAL CLINICAL IMPRESSION(S) / ED DIAGNOSES  Final diagnoses:  Closed traumatic minimally displaced fracture of trochanter of left femur, initial encounter [facility])        Note:  This document was prepared using Dragon voice recognition software and may include unintentional dictation errors.    Sable Feil, PA-C 01/01/19 1839    Schuyler Amor, MD 01/02/19 Kathyrn Drown

## 2019-01-01 NOTE — ED Triage Notes (Signed)
Pt presents w/ c/o L hip pain. Pt fell @ home, lives alone, denies LoC. Pt c/o painful weight bearing and ambulation. Pt was able to walk at home after fall. Pt states she bent over to pick something up and lost her balance. Pt denies dizziness, lightheadedness, SoB and chest pain prior to fall. No rotation observed, slight shortening of L leg. Pain increased w/ palpation on lateral L hip, bending leg at knee and hip and lifting leg at hip joint. Pelvis stable to palpation. Positive strong distal pulses and no loss of sensation distal to L hip.

## 2019-01-19 ENCOUNTER — Ambulatory Visit: Payer: Medicare Other | Admitting: Urology

## 2019-02-17 ENCOUNTER — Ambulatory Visit: Payer: Medicare Other | Admitting: Urology

## 2019-04-21 ENCOUNTER — Ambulatory Visit: Payer: Medicare Other | Admitting: Urology

## 2019-04-22 ENCOUNTER — Telehealth: Payer: Medicare Other | Admitting: Urology

## 2019-04-29 ENCOUNTER — Telehealth: Payer: Medicare Other | Admitting: Urology

## 2019-04-29 ENCOUNTER — Other Ambulatory Visit: Payer: Self-pay

## 2019-05-05 ENCOUNTER — Telehealth (INDEPENDENT_AMBULATORY_CARE_PROVIDER_SITE_OTHER): Payer: Medicare Other | Admitting: Urology

## 2019-05-05 ENCOUNTER — Other Ambulatory Visit: Payer: Self-pay

## 2019-05-05 DIAGNOSIS — N135 Crossing vessel and stricture of ureter without hydronephrosis: Secondary | ICD-10-CM

## 2019-05-06 ENCOUNTER — Other Ambulatory Visit: Payer: Self-pay | Admitting: Radiology

## 2019-05-06 ENCOUNTER — Telehealth: Payer: Self-pay | Admitting: Radiology

## 2019-05-06 ENCOUNTER — Encounter: Payer: Self-pay | Admitting: Radiology

## 2019-05-06 DIAGNOSIS — N135 Crossing vessel and stricture of ureter without hydronephrosis: Secondary | ICD-10-CM

## 2019-05-06 NOTE — Progress Notes (Signed)
Virtual Visit via Video Note  I connected with April Green on 05/06/19 at  4:30 PM EDT by a video enabled telemedicine application and verified that I am speaking with the correct person using two identifiers.  Location: Patient: home, accompanied by niece Provider: office   I discussed the limitations of evaluation and management by telemedicine and the availability of in person appointments. The patient expressed understanding and agreed to proceed.  History of Present Illness: [age] year old female with a congenital right UPJ obstruction managed with chronic indwelling ureteral stent (metallic) who presents today to discuss annual stent exchange.  Her last exchange was on 02/16/2018.  Her follow-up is been delayed in light of COVID-19.  She is very anxious about this as she is very high risk.  She denies any significant urinary symptoms today.  She is tolerating her stent well as usual.  No recent urinary tract infections.  She does have history of baseline CKD but reports that her primary care told her this year that her creatinine is actually improved.  Since last year, she is had no significant changes in medical history other than sustaining 2 falls.  She reports that she is continuing to recover from a fall where she injured, but did not break a hip.   Observations/Objective: Appears younger than stated age, A&O x3  Assessment and Plan:  1. Obstruction of right ureteropelvic junction (UPJ) Managed with chronic indwelling ureteral stent which is exchanged annually Due for exchange (actually slightly overdue in light of COVID-19 ) We discuss risks and benefits of ureteral stent exchange bleeding, infection, Risk of anesthetic was also discussed She understands that she will be needed to be tested for COVID-19 preop She is very hesitant to bring a urine sample in she does not want to come into the hospital if it is unnecessary I have agreed to mail her a specimen cup,  allow her to void at home and bring the cup to her preadmission testing appointment where she will be swabbed, agreeable this plan  Follow Up Instructions: Arrange stent exchange   I discussed the assessment and treatment plan with the patient. The patient was provided an opportunity to ask questions and all were answered. The patient agreed with the plan and demonstrated an understanding of the instructions.   The patient was advised to call back or seek an in-person evaluation if the symptoms worsen or if the condition fails to improve as anticipated.  I provided 12 minutes of non-face-to-face time during this encounter.   Hollice Espy, MD

## 2019-05-06 NOTE — Telephone Encounter (Signed)
LMOM for return call to discuss ureteral stent exchange with Dr [name].

## 2019-05-11 ENCOUNTER — Other Ambulatory Visit: Payer: Self-pay | Admitting: Radiology

## 2019-05-31 ENCOUNTER — Other Ambulatory Visit: Payer: Self-pay

## 2019-05-31 ENCOUNTER — Encounter
Admission: RE | Admit: 2019-05-31 | Discharge: 2019-05-31 | Disposition: A | Discharge status: Home / Self Care | Payer: Medicare Other | Source: Ambulatory Visit | Attending: Urology | Admitting: Urology

## 2019-05-31 DIAGNOSIS — I10 Essential (primary) hypertension: Secondary | ICD-10-CM | POA: Insufficient documentation

## 2019-05-31 DIAGNOSIS — Z20828 Contact with and (suspected) exposure to other viral communicable diseases: Secondary | ICD-10-CM | POA: Insufficient documentation

## 2019-05-31 DIAGNOSIS — Z01818 Encounter for other preprocedural examination: Secondary | ICD-10-CM | POA: Diagnosis present

## 2019-05-31 DIAGNOSIS — R9431 Abnormal electrocardiogram [ECG] [EKG]: Secondary | ICD-10-CM | POA: Diagnosis not present

## 2019-05-31 HISTORY — DX: Personal history of urinary calculi: Z87.442

## 2019-05-31 HISTORY — DX: Unsteadiness on feet: R26.81

## 2019-05-31 LAB — BASIC METABOLIC PANEL
Anion gap: 10 (ref 5–15)
BUN: 62 mg/dL — ABNORMAL HIGH (ref 8–23)
CO2: 23 mmol/L (ref 22–32)
Calcium: 9.1 mg/dL (ref 8.9–10.3)
Chloride: 105 mmol/L (ref 98–111)
Creatinine, Ser: 1.55 mg/dL — ABNORMAL HIGH (ref 0.44–1.00)
GFR calc Af Amer: 33 mL/min — ABNORMAL LOW (ref >60)
GFR calc non Af Amer: 28 mL/min — ABNORMAL LOW (ref >60)
Glucose, Bld: 99 mg/dL (ref 70–99)
Potassium: 4.8 mmol/L (ref 3.5–5.1)
Sodium: 138 mmol/L (ref 135–145)

## 2019-05-31 LAB — CBC
HCT: 34.7 % — ABNORMAL LOW (ref 36.0–46.0)
Hemoglobin: 11.1 g/dL — ABNORMAL LOW (ref 12.0–15.0)
MCH: 31.9 pg (ref 26.0–34.0)
MCHC: 32 g/dL (ref 30.0–36.0)
MCV: 99.7 fL (ref 80.0–100.0)
Platelets: 320 10*3/uL (ref 150–400)
RBC: 3.48 MIL/uL — ABNORMAL LOW (ref 3.87–5.11)
RDW: 13.1 % (ref 11.5–15.5)
WBC: 6.9 10*3/uL (ref 4.0–10.5)
nRBC: 0 % (ref 0.0–0.2)

## 2019-05-31 NOTE — Pre-Procedure Instructions (Signed)
Met B & CBC sent to Dr. [name] and Anesthesia for review.

## 2019-05-31 NOTE — Pre-Procedure Instructions (Signed)
CALLED AND FAXED EKG/REQUEST FOR CLEARANCE TO [place] AT DR Damar. MESSAGED AND FAXED TO [facility] AT DR [name]

## 2019-05-31 NOTE — Patient Instructions (Signed)
Your procedure is scheduled on: 06/07/19 Report to Day Surgery.[facility]ICAL [facility] SECOND FLOOR To find out your arrival time please call [phone] between 1PM - 3PM on 06/04/19.  Remember: Instructions that are not followed completely may result in serious medical risk,  up to and including death, or upon the discretion of your surgeon and anesthesiologist your  surgery may need to be rescheduled.     _X__ 1. Do not eat food after midnight the night before your procedure.                 No gum chewing or hard candies. You may drink clear liquids up to 2 hours                 before you are scheduled to arrive for your surgery- DO not drink clear                 liquids within 2 hours of the start of your surgery.                 Clear Liquids include:  water, apple juice without pulp, clear carbohydrate                 drink such as Clearfast of Gatorade, Black Coffee or Tea (Do not add                 anything to coffee or tea).  __X__2.  On the morning of surgery brush your teeth with toothpaste and water, you                may rinse your mouth with mouthwash if you wish.  Do not swallow any toothpaste of mouthwash.     _X__ 3.  No Alcohol for 24 hours before or after surgery.   _X__ 4.  Do Not Smoke or use e-cigarettes For 24 Hours Prior to Your Surgery.                 Do not use any chewable tobacco products for at least 6 hours prior to                 surgery.  ____  5.  Bring all medications with you on the day of surgery if instructed.   __X__  6.  Notify your doctor if there is any change in your medical condition      (cold, fever, infections).     Do not wear jewelry, make-up, hairpins, clips or nail polish. Do not wear lotions, powders, or perfumes. You may wear deodorant. Do not shave 48 hours prior to surgery. Men may shave face and neck. Do not bring valuables to the hospital.    [facility] is not responsible for any belongings or  valuables.  Contacts, dentures or bridgework may not be worn into surgery. Leave your suitcase in the car. After surgery it may be brought to your room. For patients admitted to the hospital, discharge time is determined by your treatment team.   Patients discharged the day of surgery will not be allowed to drive home.         __X__ Take these medicines the morning of surgery with A SIP OF WATER:    1. CITALOPRAM  2.   3.   4.  5.  6.  ____ Fleet Enema (as directed)   ____ Use CHG Soap as directed  ____ Use inhalers on the day of surgery  ____ Stop metformin 2 days  prior to surgery    ____ Take 1/2 of usual insulin dose the night before surgery. No insulin the morning          of surgery.   ____ Stop Coumadin/Plavix/aspirin on   ____ Stop Anti-inflammatories on    _X___ Stop supplements until after surgery.   GARLIC, MELATONIN AND FISH OIL TODAY 05/31/19 ____ Bring C-Pap to the hospital.

## 2019-06-01 ENCOUNTER — Other Ambulatory Visit: Payer: Medicare Other

## 2019-06-02 LAB — URINE CULTURE: Culture: >=100000 — AB

## 2019-06-02 NOTE — Pre-Procedure Instructions (Signed)
UC FAXED TO DR [name]

## 2019-06-03 ENCOUNTER — Other Ambulatory Visit
Admission: RE | Admit: 2019-06-03 | Discharge: 2019-06-03 | Disposition: A | Discharge status: Home / Self Care | Payer: Medicare Other | Source: Ambulatory Visit | Attending: Urology | Admitting: Urology

## 2019-06-03 ENCOUNTER — Other Ambulatory Visit: Payer: Self-pay

## 2019-06-03 DIAGNOSIS — Z01818 Encounter for other preprocedural examination: Secondary | ICD-10-CM | POA: Diagnosis not present

## 2019-06-03 LAB — SARS CORONAVIRUS 2 (TAT 6-24 HRS): SARS Coronavirus 2: NEGATIVE

## 2019-06-06 MED ORDER — CIPROFLOXACIN IN D5W 400 MG/200ML IV SOLN
400.0000 mg | INTRAVENOUS | Status: AC
  Administered 2019-06-07: 400 mg via INTRAVENOUS

## 2019-06-07 ENCOUNTER — Encounter
Admission: RE | Disposition: A | Discharge status: Home / Self Care | Payer: Self-pay | Source: Home / Self Care | Attending: Urology

## 2019-06-07 ENCOUNTER — Ambulatory Visit
Admission: RE | Admit: 2019-06-07 | Discharge: 2019-06-07 | Disposition: A | Discharge status: Home / Self Care | Payer: Medicare Other | Attending: Urology | Admitting: Urology

## 2019-06-07 ENCOUNTER — Other Ambulatory Visit: Payer: Self-pay

## 2019-06-07 ENCOUNTER — Encounter: Payer: Self-pay | Admitting: *Deleted

## 2019-06-07 ENCOUNTER — Ambulatory Visit: Payer: Medicare Other | Admitting: Anesthesiology

## 2019-06-07 DIAGNOSIS — Z880 Allergy status to penicillin: Secondary | ICD-10-CM | POA: Diagnosis not present

## 2019-06-07 DIAGNOSIS — Z79899 Other long term (current) drug therapy: Secondary | ICD-10-CM | POA: Diagnosis not present

## 2019-06-07 DIAGNOSIS — Z885 Allergy status to narcotic agent status: Secondary | ICD-10-CM | POA: Insufficient documentation

## 2019-06-07 DIAGNOSIS — I129 Hypertensive chronic kidney disease with stage 1 through stage 4 chronic kidney disease, or unspecified chronic kidney disease: Secondary | ICD-10-CM | POA: Diagnosis not present

## 2019-06-07 DIAGNOSIS — Z882 Allergy status to sulfonamides status: Secondary | ICD-10-CM | POA: Insufficient documentation

## 2019-06-07 DIAGNOSIS — F419 Anxiety disorder, unspecified: Secondary | ICD-10-CM | POA: Diagnosis not present

## 2019-06-07 DIAGNOSIS — Z881 Allergy status to other antibiotic agents status: Secondary | ICD-10-CM | POA: Diagnosis not present

## 2019-06-07 DIAGNOSIS — Z85828 Personal history of other malignant neoplasm of skin: Secondary | ICD-10-CM | POA: Diagnosis not present

## 2019-06-07 DIAGNOSIS — Q6239 Other obstructive defects of renal pelvis and ureter: Secondary | ICD-10-CM | POA: Insufficient documentation

## 2019-06-07 DIAGNOSIS — N184 Chronic kidney disease, stage 4 (severe): Secondary | ICD-10-CM | POA: Diagnosis not present

## 2019-06-07 DIAGNOSIS — L409 Psoriasis, unspecified: Secondary | ICD-10-CM | POA: Diagnosis not present

## 2019-06-07 DIAGNOSIS — H409 Unspecified glaucoma: Secondary | ICD-10-CM | POA: Diagnosis not present

## 2019-06-07 DIAGNOSIS — Z87442 Personal history of urinary calculi: Secondary | ICD-10-CM | POA: Diagnosis not present

## 2019-06-07 DIAGNOSIS — Z886 Allergy status to analgesic agent status: Secondary | ICD-10-CM | POA: Diagnosis not present

## 2019-06-07 DIAGNOSIS — N135 Crossing vessel and stricture of ureter without hydronephrosis: Secondary | ICD-10-CM

## 2019-06-07 DIAGNOSIS — Z87892 Personal history of anaphylaxis: Secondary | ICD-10-CM | POA: Diagnosis not present

## 2019-06-07 HISTORY — PX: CYSTOSCOPY W/ URETERAL STENT PLACEMENT: SHX1429

## 2019-06-07 SURGERY — CYSTOSCOPY, FLEXIBLE, WITH STENT REPLACEMENT
Anesthesia: General | Laterality: Right

## 2019-06-07 MED ORDER — FAMOTIDINE 20 MG PO TABS
20.0000 mg | ORAL_TABLET | Freq: Once | ORAL | Status: AC
  Administered 2019-06-07: 20 mg via ORAL

## 2019-06-07 MED ORDER — DEXAMETHASONE SODIUM PHOSPHATE 10 MG/ML IJ SOLN
INTRAMUSCULAR | Status: DC | PRN
  Administered 2019-06-07: 10 mg via INTRAVENOUS

## 2019-06-07 MED ORDER — LIDOCAINE HCL (CARDIAC) PF 100 MG/5ML IV SOSY
PREFILLED_SYRINGE | INTRAVENOUS | Status: DC | PRN
  Administered 2019-06-07: 80 mg via INTRAVENOUS

## 2019-06-07 MED ORDER — CIPROFLOXACIN IN D5W 400 MG/200ML IV SOLN
INTRAVENOUS | Status: AC
  Filled 2019-06-07: qty 200

## 2019-06-07 MED ORDER — FAMOTIDINE 20 MG PO TABS
ORAL_TABLET | ORAL | Status: AC
  Administered 2019-06-07: 20 mg via ORAL
  Filled 2019-06-07: qty 1

## 2019-06-07 MED ORDER — SODIUM CHLORIDE 0.9 % IV SOLN
INTRAVENOUS | Status: DC
  Administered 2019-06-07: 09:00:00 via INTRAVENOUS

## 2019-06-07 MED ORDER — PROPOFOL 10 MG/ML IV BOLUS
INTRAVENOUS | Status: AC
  Filled 2019-06-07: qty 20

## 2019-06-07 MED ORDER — FENTANYL CITRATE (PF) 100 MCG/2ML IJ SOLN
INTRAMUSCULAR | Status: AC
  Filled 2019-06-07: qty 2

## 2019-06-07 MED ORDER — FENTANYL CITRATE (PF) 100 MCG/2ML IJ SOLN: 25.0000 ug | INTRAMUSCULAR | Status: DC | PRN

## 2019-06-07 MED ORDER — PROPOFOL 10 MG/ML IV BOLUS
INTRAVENOUS | Status: DC | PRN
  Administered 2019-06-07: 100 mg via INTRAVENOUS

## 2019-06-07 MED ORDER — PHENYLEPHRINE HCL (PRESSORS) 10 MG/ML IV SOLN
INTRAVENOUS | Status: DC | PRN
  Administered 2019-06-07 (×2): 100 ug via INTRAVENOUS

## 2019-06-07 MED ORDER — FENTANYL CITRATE (PF) 100 MCG/2ML IJ SOLN
INTRAMUSCULAR | Status: DC | PRN
  Administered 2019-06-07: 25 ug via INTRAVENOUS

## 2019-06-07 MED ORDER — LACTATED RINGERS IV SOLN
INTRAVENOUS | Status: DC | PRN
  Administered 2019-06-07: 12:00:00 via INTRAVENOUS

## 2019-06-07 SURGICAL SUPPLY — 21 items
BAG DRAIN CYSTO-URO LG1000N (MISCELLANEOUS) ×3 IMPLANT
BRUSH SCRUB EZ  4% CHG (MISCELLANEOUS) ×2
BRUSH SCRUB EZ 4% CHG (MISCELLANEOUS) IMPLANT
CATH URETL 5X70 OPEN END (CATHETERS) ×3 IMPLANT
CONRAY 43 FOR UROLOGY 50M (MISCELLANEOUS) ×1 IMPLANT
GLOVE BIO SURGEON STRL SZ 6.5 (GLOVE) ×2 IMPLANT
GLOVE BIO SURGEONS STRL SZ 6.5 (GLOVE) ×1
GOWN STRL REUS W/ TWL LRG LVL3 (GOWN DISPOSABLE) ×2 IMPLANT
GOWN STRL REUS W/TWL LRG LVL3 (GOWN DISPOSABLE) ×4
GUIDEWIRE STR DUAL SENSOR (WIRE) ×3 IMPLANT
KIT TURNOVER CYSTO (KITS) ×3 IMPLANT
PACK CYSTO AR (MISCELLANEOUS) ×3 IMPLANT
SET CYSTO W/LG BORE CLAMP LF (SET/KITS/TRAYS/PACK) ×3 IMPLANT
SOL .9 NS 3000ML IRR  AL (IV SOLUTION) ×2
SOL .9 NS 3000ML IRR UROMATIC (IV SOLUTION) ×1 IMPLANT
STENT URET 6FRX24 CONTOUR (STENTS) IMPLANT
STENT URET 6FRX26 CONTOUR (STENTS) IMPLANT
STENT URETERAL METAL 6X22 (STENTS) ×2 IMPLANT
SURGILUBE 2OZ TUBE FLIPTOP (MISCELLANEOUS) ×3 IMPLANT
SYRINGE IRR TOOMEY STRL 70CC (SYRINGE) ×3 IMPLANT
WATER STERILE IRR 1000ML POUR (IV SOLUTION) ×3 IMPLANT

## 2019-06-07 NOTE — Discharge Instructions (Signed)
You have a ureteral stent in place.  This is a tube that extends from your kidney to your bladder.  This may cause urinary bleeding, burning with urination, and urinary frequency.  Please call our office or present to the ED if you develop fevers >101 or pain which is not able to be controlled with oral pain medications.  You may be given either Flomax and/ or ditropan to help with bladder spasms and stent pain in addition to pain medications.    [facility] [address] [place], [place] [address] [phone]   AMBULATORY SURGERY  DISCHARGE INSTRUCTIONS   1) The drugs that you were given will stay in your system until tomorrow so for the next 24 hours you should not:  A) Drive an automobile B) Make any legal decisions C) Drink any alcoholic beverage   2) You may resume regular meals tomorrow.  Today it is better to start with liquids and gradually work up to solid foods.  You may eat anything you prefer, but it is better to start with liquids, then soup and crackers, and gradually work up to solid foods.   3) Please notify your doctor immediately if you have any unusual bleeding, trouble breathing, redness and pain at the surgery site, drainage, fever, or pain not relieved by medication. 4)   5) Your post-operative visit with Dr.                                     is: Date:                        Time:    Please call to schedule your post-operative visit.  6) Additional Instructions:

## 2019-06-07 NOTE — Anesthesia Post-op Follow-up Note (Signed)
Anesthesia QCDR form completed.        

## 2019-06-07 NOTE — H&P (Signed)
H&P updated 06/07/19 without change RRR CTAB  Past Medical History:  Diagnosis Date  . Acute cystitis   . Anxiety   . Cancer ([place])    skin cancer  . Chronic UTI   . Glaucoma   . History of kidney stones   . Hydronephrosis    stage IV ckd  . Hypertension   . Incomplete bladder emptying   . Macular degeneration   . PONV (postoperative nausea and vomiting)    nausea only  . Renal cyst   . SUI (stress urinary incontinence, female)   . Unsteady gait   . UPJ obstruction, congenital   . Vertigo    Current Meds  Medication Sig  . amLODipine (NORVASC) 10 MG tablet Take 10 mg by mouth at bedtime.   . citalopram (CELEXA) 10 MG tablet Take 5 mg by mouth every morning.   Marland Kitchen estradiol (ESTRACE VAGINAL) 0.1 MG/GM vaginal cream Apply 0.5mg  (pea-sized amount)  just inside the vaginal introitus with a finger-tip every night for two weeks and then Monday, Wednesday and Friday nights. (Patient taking differently: Place 1 Applicatorful vaginally 3 (three) times a week. Monday, Wednesday and Friday nights.)  . Garlic 4132 MG CAPS Take 1,000 mg by mouth daily.   Marland Kitchen latanoprost (XALATAN) 0.005 % ophthalmic solution Place 1 drop into both eyes at bedtime.   Marland Kitchen LORazepam (ATIVAN) 0.5 MG tablet Take 0.5 mg by mouth at bedtime.  Marland Kitchen losartan (COZAAR) 25 MG tablet Take 25 mg by mouth daily.   . Melatonin 3 MG TABS Take 3 mg by mouth at bedtime.   . Multiple Vitamins-Minerals (MULTIVITAMIN PO) Take 1 tablet by mouth daily.  . Multiple Vitamins-Minerals (PRESERVISION AREDS PO) Take 1 tablet by mouth 2 (two) times daily.   . Omega-3 Fatty Acids (FISH OIL) 1000 MG CAPS Take 1,000 mg by mouth daily.   . timolol (BETIMOL) 0.5 % ophthalmic solution Place 1 drop into both eyes daily.  Marland Kitchen triamcinolone cream (KENALOG) 0.1 % Apply 1 application topically 2 (two) times daily as needed (for psoriasis).    Allergies  Allergen Reactions  . Sulfa Antibiotics Anaphylaxis  . Amoxicillin-Pot Clavulanate Diarrhea and Nausea  Only    Stomach pains  . Azithromycin Nausea Only and Other (See Comments)    Confusion also  . Trazodone And Nefazodone Other (See Comments)    Disorientation and sleep deprivation  . 5-Methoxypsoralen Other (See Comments)    Altered mental status Used to treat psoriasis and/or vitiligo  . Aspirin Other (See Comments)    EARS START TO RING  . Ceftriaxone Nausea Only  . Diphenhydramine Other (See Comments)    Confusion  . Flexeril [Cyclobenzaprine] Nausea Only  . Levofloxacin Other (See Comments)    Causes pain in calf of leg when walking  . Oxycodone Other (See Comments)    Causes heavy drowsiness       I connected withRuby T Cranston on 05/06/19 at  4:30 PM EDT by a video enabled telemedicine application and verified that I am speaking with the correct person using two identifiers.   Location: Patient: home, accompanied by niece Provider: office  I discussed the limitations of evaluation and management by telemedicine and the availability of in person appointments. The patient expressed understanding and agreed to proceed.  History of Present Illness: [age] year old female with a congenital right UPJ obstruction managed with chronic indwelling ureteral stent (metallic) who presents today to discuss annual stent exchange.  Her last exchange was on 02/16/2018.  Her follow-up  is been delayed in light of COVID-19.  She is very anxious about this as she is very high risk.  She denies any significant urinary symptoms today.  She is tolerating her stent well as usual.  No recent urinary tract infections.  She does have history of baseline CKD but reports that her primary care told her this year that her creatinine is actually improved.  Since last year, she is had no significant changes in medical history other than sustaining 2 falls.  She reports that she is continuing to recover from a fall where she injured, but did not break a  hip.  Observations/Objective: Appears younger than stated age, A&O x3  Assessment and Plan:  1. Obstruction of right ureteropelvic junction (UPJ) Managed with chronic indwelling ureteral stent which is exchanged annually Due for exchange (actually slightly overdue in light of COVID-19 ) We discuss risks and benefits of ureteral stent exchange bleeding, infection, Risk of anesthetic was also discussed She understands that she will be needed to be tested for COVID-19 preop She is very hesitant to bring a urine sample in she does not want to come into the hospital if it is unnecessary I have agreed to mail her a specimen cup, allow her to void at home and bring the cup to her preadmission testing appointment where she will be swabbed, agreeable this plan  Follow Up Instructions: Arrange stent exchange  I discussed the assessment and treatment plan with the patient. The patient was provided an opportunity to ask questions and all were answered. The patient agreed with the plan and demonstrated an understanding of the instructions.  The patient was advised to call back or seek an in-person evaluation if the symptoms worsen or if the condition fails to improve as anticipated.  I provided 12 minutes of non-face-to-face time during this encounter.   Hollice Espy, MD

## 2019-06-07 NOTE — Op Note (Signed)
Date of procedure:06/07/2019  Preoperative diagnosis:  1. Right UPJ obstruction 2. Chronic right indwelling ureteral stent  Postoperative diagnosis:  1. Same as above  Procedure: 1. Cystoscopy 2. Right retrograde pyelogram 3. Right ureteral stent exchange with metallic stent  Surgeon: Hollice Espy, MD  Anesthesia: General  Complications: None  Intraoperative findings: Right ureteral stent exchange without difficulty today.  EBL: Normal  Specimens: None  Drains: 6 x 22 French double-J metallic right ureteral stent  Indication: April T Simmonsis a 85 y.o.patient with chronic right UPJ obstruction managed with annual metal stent exchanges.Her stent was last changed approximately one year ago.After reviewing the management options for treatment, she elected to proceed with the above surgical procedure(s). We have discussed the potential benefits and risks of the procedure, side effects of the proposed treatment, the likelihood of the patient achieving the goals of the procedure, and any potential problems that might occur during the procedure or recuperation. Informed consent has been obtained.  Description of procedure:  The patient was taken to the operating room and general anesthesia was induced. The patient was placed in the dorsal lithotomy position, prepped and draped in the usual sterile fashion, and preoperative antibiotics were administered. A preoperative time-out was performed.   A 21 French rigid cystoscopy was advanced per urethra into the bladder. Attention was turned to the right ureteral orifice from which a indwelling metal ureteral stent was identified. There is no encrustation on the stent. The remainder of the bladder appeared relatively normal with some very mild inflammatory changes on the trigone underlying the stent but no suspicious areas. The distal coil of the stent was grasped using stent graspers and it easily removed under  fluoroscopic guidance.  The open-ended ureteral catheter was placed just within the UO and a gentle retrograde pyelogram was performed which showed a decompressed ureter and upper tract collecting system.  There is no extravasation noted.  A sensor wire was then placed up to level of the kidney without difficulty. The sheath with introducer from the metal stent Was advanced up to level of the renal pelvis using the distal marker to assess its position. The inner lumen was then removed. The metal stent was then advanced through the introducer sheath up to level of the renal pelvis. A pressure was then used to create a coil within the renal pelvis and a Seldinger technique was then used to remove the sheath leaving the stent in place. A full coil was noted within the bladder once the introducer sheath was completely removed. The scope was reintroduced into the bladder to ensure adequate position of the stent and distal coil which appeared to be a full coil in satisfactory position. The bladder was then drained and the scope was removed. She was repositioned the supine position, reversed from anesthesia, and taken to the PACU in stable condition.  Plan: Patient will follow up in 11 months to arrange for her next stent exchange in one year.

## 2019-06-07 NOTE — Anesthesia Preprocedure Evaluation (Signed)
Anesthesia Evaluation  Patient identified by MRN, date of birth, ID band Patient awake    Reviewed: Allergy & Precautions, H&P , NPO status , Patient's Chart, lab work & pertinent test results  History of Anesthesia Complications (+) PONV and history of anesthetic complications  Airway Mallampati: III  TM Distance: <3 FB Neck ROM: limited    Dental  (+) Chipped, Poor Dentition   Pulmonary neg pulmonary ROS, neg shortness of breath,           Cardiovascular Exercise Tolerance: Good hypertension, (-) angina(-) Past MI and (-) DOE      Neuro/Psych PSYCHIATRIC DISORDERS negative neurological ROS     GI/Hepatic negative GI ROS, Neg liver ROS,   Endo/Other  negative endocrine ROS  Renal/GU CRFRenal disease     Musculoskeletal   Abdominal   Peds  Hematology negative hematology ROS (+)   Anesthesia Other Findings Past Medical History: No date: Acute cystitis No date: Anxiety No date: Cancer [facility])     Comment:  skin cancer No date: Chronic UTI No date: Glaucoma No date: History of kidney stones No date: Hydronephrosis     Comment:  stage IV ckd No date: Hypertension No date: Incomplete bladder emptying No date: Macular degeneration No date: PONV (postoperative nausea and vomiting)     Comment:  nausea only No date: Renal cyst No date: SUI (stress urinary incontinence, female) No date: Unsteady gait No date: UPJ obstruction, congenital No date: Vertigo  Past Surgical History: No date: ABDOMINAL HYSTERECTOMY No date: cysto stent exchange     Comment:  once a year for last 6 years. metal stent 02/16/2018: CYSTOSCOPY W/ RETROGRADES; Right     Comment:  Procedure: CYSTOSCOPY WITH RETROGRADE PYELOGRAM;                Surgeon: Hollice Espy, MD;  Location: [facility]S;                Service: Urology;  Laterality: Right; 01/01/2016: CYSTOSCOPY W/ URETERAL STENT PLACEMENT; Right     Comment:  Procedure: CYSTOSCOPY  WITH STENT REPLACEMENT;  Surgeon:               Hollice Espy, MD;  Location: [facility];  Service:               Urology;  Laterality: Right; 02/18/2017: CYSTOSCOPY W/ URETERAL STENT PLACEMENT; Right     Comment:  Procedure: CYSTOSCOPY WITH STENT REPLACEMENT;  Surgeon:               Hollice Espy, MD;  Location: [facility] OR[facility];  Service:               Urology;  Laterality: Right; 02/16/2018: CYSTOSCOPY W/ URETERAL STENT PLACEMENT; Right     Comment:  Procedure: CYSTOSCOPY WITH STENT REPLACEMENT;  Surgeon:               Hollice Espy, MD;  Location: [facility];  Service:               Urology;  Laterality: Right; No date: EYE SURGERY; Bilateral     Comment:  cataract extractions 05/2008: HERNIA REPAIR; Left     Comment:  inguinal hernia  BMI    Body Mass Index: 16.54 kg/m      Reproductive/Obstetrics negative OB ROS                             Anesthesia Physical Anesthesia Plan  ASA:  IV  Anesthesia Plan: General LMA   Post-op Pain Management:    Induction: Intravenous  PONV Risk Score and Plan: Dexamethasone, Ondansetron, Midazolam and Treatment may vary due to age or medical condition  Airway Management Planned: LMA  Additional Equipment:   Intra-op Plan:   Post-operative Plan: Extubation in OR  Informed Consent: I have reviewed the patients History and Physical, chart, labs and discussed the procedure including the risks, benefits and alternatives for the proposed anesthesia with the patient or authorized representative who has indicated his/her understanding and acceptance.     Dental Advisory Given  Plan Discussed with: Anesthesiologist, CRNA and Surgeon  Anesthesia Plan Comments: (Patient consented for risks of anesthesia including but not limited to:  - adverse reactions to medications - damage to teeth, lips or other oral mucosa - sore throat or hoarseness - Damage to heart, brain, lungs or loss of life  Patient voiced understanding.)         Anesthesia Quick Evaluation

## 2019-06-07 NOTE — Anesthesia Procedure Notes (Signed)
Procedure Name: LMA Insertion Performed by: Philbert Riser, CRNA Pre-anesthesia Checklist: Patient identified, Emergency Drugs available, Suction available, Patient being monitored and Timeout performed Patient Re-evaluated:Patient Re-evaluated prior to induction Oxygen Delivery Method: Circle system utilized and Simple face mask Preoxygenation: Pre-oxygenation with 100% oxygen Induction Type: IV induction Ventilation: Mask ventilation without difficulty Tube size: 3.0 mm Number of attempts: 2 Dental Injury: Teeth and Oropharynx as per pre-operative assessment  Difficulty Due To: Difficult Airway- due to limited oral opening

## 2019-06-07 NOTE — Transfer of Care (Signed)
Immediate Anesthesia Transfer of Care Note  Patient: April Green  Procedure(s) Performed: CYSTOSCOPY WITH STENT REPLACEMENT (Right )  Patient Location: PACU  Anesthesia Type:General  Level of Consciousness: awake  Airway & Oxygen Therapy: Patient Spontanous Breathing and Patient connected to face mask oxygen  Post-op Assessment: Report given to RN and Post -op Vital signs reviewed and stable  Post vital signs: Reviewed and stable  Last Vitals:  Vitals Value Taken Time  BP    Temp    Pulse    Resp 14 06/07/19 1240  SpO2    Vitals shown include unvalidated device data.  Last Pain:  Vitals:   06/07/19 0909  TempSrc: Tympanic  PainSc: 0-No pain         Complications: No apparent anesthesia complications

## 2019-06-17 NOTE — Anesthesia Postprocedure Evaluation (Signed)
Anesthesia Post Note  Patient: April Green  Procedure(s) Performed: CYSTOSCOPY WITH STENT REPLACEMENT (Right )  Patient location during evaluation: PACU Anesthesia Type: General Level of consciousness: awake and alert Pain management: pain level controlled Vital Signs Assessment: post-procedure vital signs reviewed and stable Respiratory status: spontaneous breathing, nonlabored ventilation, respiratory function stable and patient connected to nasal cannula oxygen Cardiovascular status: blood pressure returned to baseline and stable Postop Assessment: no apparent nausea or vomiting Anesthetic complications: no     Last Vitals:  Vitals:   06/07/19 1331 06/07/19 1348  BP: (!) 165/58 (!) 162/62  Pulse: (!) 59 62  Resp: 14 14  Temp: (!) 36.2 C   SpO2: 99% 98%    Last Pain:  Vitals:   06/07/19 1348  TempSrc:   PainSc: 0-No pain                 Molli Barrows

## 2020-02-24 ENCOUNTER — Telehealth: Payer: Self-pay | Admitting: *Deleted

## 2020-02-24 ENCOUNTER — Other Ambulatory Visit: Payer: Self-pay | Admitting: *Deleted

## 2020-02-24 DIAGNOSIS — N952 Postmenopausal atrophic vaginitis: Secondary | ICD-10-CM

## 2020-02-24 MED ORDER — ESTRADIOL 0.1 MG/GM VA CREA: TOPICAL_CREAM | VAGINAL | 12 refills | Status: DC

## 2020-02-24 NOTE — Telephone Encounter (Signed)
Refill request sent

## 2020-03-21 ENCOUNTER — Encounter: Payer: Self-pay | Admitting: Urology

## 2020-03-27 NOTE — H&P (View-Only) (Signed)
03/28/20 11:07 AM   Bertram Millard Tobie Poet 1923-01-02 [id number]  Referring provider: [name], MD [address] [place],  [place] [address] Chief Complaint  Patient presents with  . discuss stent change    HPI: April Green is a 86 y.o. F with a congenital right UPJ obstruction managed with chronic indwelling ureteral stent (metallic) returns today for a 11 month f/u for stent exchange.   Her last exchange was on 06/07/2019.    She denies any significant urinary symptoms today.  She is tolerating her stent well as usual.  No recent urinary tract infections.  She does have history of baseline CKD.   Since last year, she has had no significant changes in medical history.   PMH: Past Medical History:  Diagnosis Date  . Acute cystitis   . Anxiety   . Cancer ([place])    skin cancer  . Chronic UTI   . Glaucoma   . History of kidney stones   . Hydronephrosis    stage IV ckd  . Hypertension   . Incomplete bladder emptying   . Macular degeneration   . PONV (postoperative nausea and vomiting)    nausea only  . Renal cyst   . SUI (stress urinary incontinence, female)   . Unsteady gait   . UPJ obstruction, congenital   . Vertigo     Surgical History: Past Surgical History:  Procedure Laterality Date  . ABDOMINAL HYSTERECTOMY    . cysto stent exchange     once a year for last 6 years. metal stent  . CYSTOSCOPY W/ RETROGRADES Right 02/16/2018   Procedure: CYSTOSCOPY WITH RETROGRADE PYELOGRAM;  Surgeon: Hollice Espy, MD;  Location: [facility];  Service: Urology;  Laterality: Right;  . CYSTOSCOPY W/ URETERAL STENT PLACEMENT Right 01/01/2016   Procedure: CYSTOSCOPY WITH STENT REPLACEMENT;  Surgeon: Hollice Espy, MD;  Location: [facility];  Service: Urology;  Laterality: Right;  . CYSTOSCOPY W/ URETERAL STENT PLACEMENT Right 02/18/2017   Procedure: CYSTOSCOPY WITH STENT REPLACEMENT;  Surgeon: Hollice Espy, MD;  Location: [facility];  Service: Urology;   Laterality: Right;  . CYSTOSCOPY W/ URETERAL STENT PLACEMENT Right 02/16/2018   Procedure: CYSTOSCOPY WITH STENT REPLACEMENT;  Surgeon: Hollice Espy, MD;  Location: [facility];  Service: Urology;  Laterality: Right;  . CYSTOSCOPY W/ URETERAL STENT PLACEMENT Right 06/07/2019   Procedure: CYSTOSCOPY WITH STENT REPLACEMENT;  Surgeon: Hollice Espy, MD;  Location: [facility];  Service: Urology;  Laterality: Right;  . EYE SURGERY Bilateral    cataract extractions  . HERNIA REPAIR Left 05/2008   inguinal hernia    Home Medications:  Allergies as of 03/28/2020      Reactions   Sulfa Antibiotics Anaphylaxis   Amoxicillin-pot Clavulanate Diarrhea, Nausea Only   Stomach pains   Azithromycin Nausea Only, Other (See Comments)   Confusion also   Trazodone And Nefazodone Other (See Comments)   Disorientation and sleep deprivation   Mirtazapine    5-methoxypsoralen Other (See Comments)   Altered mental status Used to treat psoriasis and/or vitiligo   Aspirin Other (See Comments)   EARS START TO RING   Ceftriaxone Nausea Only   Diphenhydramine Other (See Comments)   Confusion   Flexeril [cyclobenzaprine] Nausea Only   Levofloxacin Other (See Comments)   Causes pain in calf of leg when walking   Oxycodone Other (See Comments)   Causes heavy drowsiness      Medication List       Accurate as of  Mar 28, 2020 11:59 PM. If you have any questions, ask your nurse or doctor.        STOP taking these medications   diazepam 2 MG tablet Commonly known as: VALIUM Stopped by: Hollice Espy, MD     TAKE these medications   acetaminophen 325 MG tablet Commonly known as: TYLENOL Take 325-650 mg by mouth every 6 (six) hours as needed for moderate pain.   amLODipine 10 MG tablet Commonly known as: NORVASC Take 10 mg by mouth at bedtime.   citalopram 10 MG tablet Commonly known as: CELEXA Take 5 mg by mouth daily.   estradiol 0.1 MG/GM vaginal cream Commonly known as: ESTRACE VAGINAL Apply  0.5mg  (pea-sized amount)  just inside the vaginal introitus with a finger-tip every night for two weeks and then Monday, Wednesday and Friday nights.   Fish Oil 1000 MG Caps Take 1,000 mg by mouth daily.   Garlic 5364 MG Caps Take 1,000 mg by mouth daily.   latanoprost 0.005 % ophthalmic solution Commonly known as: XALATAN Place 1 drop into both eyes at bedtime.   LORazepam 0.5 MG tablet Commonly known as: ATIVAN Take 0.5 mg by mouth at bedtime.   losartan 25 MG tablet Commonly known as: COZAAR Take 25 mg by mouth daily.   meclizine 25 MG tablet Commonly known as: ANTIVERT Take 25 mg by mouth 3 (three) times daily as needed for dizziness.   melatonin 3 MG Tabs tablet Take 3 mg by mouth at bedtime.   Melatonin 5 MG Caps Take 5 mg by mouth at bedtime.   metoprolol succinate 25 MG 24 hr tablet Commonly known as: TOPROL-XL Take by mouth.   multivitamin capsule Take 1 capsule by mouth daily.   PRESERVISION AREDS PO Take 1 tablet by mouth 2 (two) times daily. Areds 2   timolol 0.5 % ophthalmic solution Commonly known as: BETIMOL Place 1 drop into both eyes daily.   Timolol Maleate 0.5 % (DAILY) Soln Place 1 drop into both eyes daily.   timolol 0.5 % ophthalmic solution Commonly known as: TIMOPTIC 1 drop daily.   triamcinolone cream 0.1 % Commonly known as: KENALOG Apply 1 application topically 2 (two) times daily as needed (for psoriasis).       Allergies:  Allergies  Allergen Reactions  . Sulfa Antibiotics Anaphylaxis  . Amoxicillin-Pot Clavulanate Diarrhea and Nausea Only    Stomach pains  . Azithromycin Nausea Only and Other (See Comments)    Confusion also  . Trazodone And Nefazodone Other (See Comments)    Disorientation and sleep deprivation  . Mirtazapine Other (See Comments)    Dizzy  . 5-Methoxypsoralen Other (See Comments)    Altered mental status Used to treat psoriasis and/or vitiligo  . Aspirin Other (See Comments)    EARS START TO RING   . Ceftriaxone Nausea Only  . Diphenhydramine Other (See Comments)    Confusion  . Flexeril [Cyclobenzaprine] Nausea Only  . Levofloxacin Other (See Comments)    Causes pain in calf of leg when walking  Levaquin  . Oxycodone Other (See Comments)    Causes heavy drowsiness    Family History: Family History  Problem Relation Age of Onset  . Kidney disease Neg Hx   . Bladder Cancer Neg Hx   . Breast cancer Neg Hx     Social History:  reports that she has never smoked. She has never used smokeless tobacco. She reports that she does not drink alcohol or use drugs.   Physical  Exam: BP (!) 148/74   Pulse 67   Ht 5\' 1"  (1.549 m)   Wt 84 lb (38.1 kg)   BMI 15.87 kg/m   Constitutional:  Alert and oriented, No acute distress.  Appears younger than stated age.  Accompanied by care taker.   HEENT: [place] AT, moist mucus membranes.  Trachea midline, no masses. Cardiovascular: No clubbing, cyanosis, or edema. Respiratory: Normal respiratory effort, no increased work of breathing. Skin: No rashes, bruises or suspicious lesions. Neurologic: Grossly intact, no focal deficits, moving all 4 extremities. Psychiatric: Normal mood and affect.  Assessment & Plan:    1. Congenital right UPJ obstruction Managed with chronic indwelling right ureteral stent, metal exchanged annually. She is due for stent exchange. Preop urine culture today. She understands the risks of surgery including risk of infection, damage to surrounding structures, stent discomfort, sepsis, and anesthetic complications.    Return in 11 months for f/u   2. CKD, stage III Baseline   [facility] [address] [place], [place] [address] [phone]  I, Lucas Mallow, am acting as a scribe for Dr. Hollice Espy,  I have reviewed the above documentation for accuracy and completeness, and I agree with the above.   Hollice Espy, MD

## 2020-03-27 NOTE — Progress Notes (Signed)
03/28/20 11:07 AM   Bertram Millard Tobie Poet 04/05/1923 [id number]  Referring provider: [name], MD [address] [place],  [place] [address] Chief Complaint  Patient presents with  . discuss stent change    HPI: April Green is a 85 y.o. F with a congenital right UPJ obstruction managed with chronic indwelling ureteral stent (metallic) returns today for a 11 month f/u for stent exchange.   Her last exchange was on 06/07/2019.    She denies any significant urinary symptoms today.  She is tolerating her stent well as usual.  No recent urinary tract infections.  She does have history of baseline CKD.   Since last year, she has had no significant changes in medical history.   PMH: Past Medical History:  Diagnosis Date  . Acute cystitis   . Anxiety   . Cancer ([place])    skin cancer  . Chronic UTI   . Glaucoma   . History of kidney stones   . Hydronephrosis    stage IV ckd  . Hypertension   . Incomplete bladder emptying   . Macular degeneration   . PONV (postoperative nausea and vomiting)    nausea only  . Renal cyst   . SUI (stress urinary incontinence, female)   . Unsteady gait   . UPJ obstruction, congenital   . Vertigo     Surgical History: Past Surgical History:  Procedure Laterality Date  . ABDOMINAL HYSTERECTOMY    . cysto stent exchange     once a year for last 6 years. metal stent  . CYSTOSCOPY W/ RETROGRADES Right 02/16/2018   Procedure: CYSTOSCOPY WITH RETROGRADE PYELOGRAM;  Surgeon: Hollice Espy, MD;  Location: [facility];  Service: Urology;  Laterality: Right;  . CYSTOSCOPY W/ URETERAL STENT PLACEMENT Right 01/01/2016   Procedure: CYSTOSCOPY WITH STENT REPLACEMENT;  Surgeon: Hollice Espy, MD;  Location: [facility];  Service: Urology;  Laterality: Right;  . CYSTOSCOPY W/ URETERAL STENT PLACEMENT Right 02/18/2017   Procedure: CYSTOSCOPY WITH STENT REPLACEMENT;  Surgeon: Hollice Espy, MD;  Location: [facility];  Service: Urology;   Laterality: Right;  . CYSTOSCOPY W/ URETERAL STENT PLACEMENT Right 02/16/2018   Procedure: CYSTOSCOPY WITH STENT REPLACEMENT;  Surgeon: Hollice Espy, MD;  Location: [facility];  Service: Urology;  Laterality: Right;  . CYSTOSCOPY W/ URETERAL STENT PLACEMENT Right 06/07/2019   Procedure: CYSTOSCOPY WITH STENT REPLACEMENT;  Surgeon: Hollice Espy, MD;  Location: [facility];  Service: Urology;  Laterality: Right;  . EYE SURGERY Bilateral    cataract extractions  . HERNIA REPAIR Left 05/2008   inguinal hernia    Home Medications:  Allergies as of 03/28/2020      Reactions   Sulfa Antibiotics Anaphylaxis   Amoxicillin-pot Clavulanate Diarrhea, Nausea Only   Stomach pains   Azithromycin Nausea Only, Other (See Comments)   Confusion also   Trazodone And Nefazodone Other (See Comments)   Disorientation and sleep deprivation   Mirtazapine    5-methoxypsoralen Other (See Comments)   Altered mental status Used to treat psoriasis and/or vitiligo   Aspirin Other (See Comments)   EARS START TO RING   Ceftriaxone Nausea Only   Diphenhydramine Other (See Comments)   Confusion   Flexeril [cyclobenzaprine] Nausea Only   Levofloxacin Other (See Comments)   Causes pain in calf of leg when walking   Oxycodone Other (See Comments)   Causes heavy drowsiness      Medication List       Accurate as of  Mar 28, 2020 11:59 PM. If you have any questions, ask your nurse or doctor.        STOP taking these medications   diazepam 2 MG tablet Commonly known as: VALIUM Stopped by: Hollice Espy, MD     TAKE these medications   acetaminophen 325 MG tablet Commonly known as: TYLENOL Take 325-650 mg by mouth every 6 (six) hours as needed for moderate pain.   amLODipine 10 MG tablet Commonly known as: NORVASC Take 10 mg by mouth at bedtime.   citalopram 10 MG tablet Commonly known as: CELEXA Take 5 mg by mouth daily.   estradiol 0.1 MG/GM vaginal cream Commonly known as: ESTRACE VAGINAL Apply  0.5mg  (pea-sized amount)  just inside the vaginal introitus with a finger-tip every night for two weeks and then Monday, Wednesday and Friday nights.   Fish Oil 1000 MG Caps Take 1,000 mg by mouth daily.   Garlic 9485 MG Caps Take 1,000 mg by mouth daily.   latanoprost 0.005 % ophthalmic solution Commonly known as: XALATAN Place 1 drop into both eyes at bedtime.   LORazepam 0.5 MG tablet Commonly known as: ATIVAN Take 0.5 mg by mouth at bedtime.   losartan 25 MG tablet Commonly known as: COZAAR Take 25 mg by mouth daily.   meclizine 25 MG tablet Commonly known as: ANTIVERT Take 25 mg by mouth 3 (three) times daily as needed for dizziness.   melatonin 3 MG Tabs tablet Take 3 mg by mouth at bedtime.   Melatonin 5 MG Caps Take 5 mg by mouth at bedtime.   metoprolol succinate 25 MG 24 hr tablet Commonly known as: TOPROL-XL Take by mouth.   multivitamin capsule Take 1 capsule by mouth daily.   PRESERVISION AREDS PO Take 1 tablet by mouth 2 (two) times daily. Areds 2   timolol 0.5 % ophthalmic solution Commonly known as: BETIMOL Place 1 drop into both eyes daily.   Timolol Maleate 0.5 % (DAILY) Soln Place 1 drop into both eyes daily.   timolol 0.5 % ophthalmic solution Commonly known as: TIMOPTIC 1 drop daily.   triamcinolone cream 0.1 % Commonly known as: KENALOG Apply 1 application topically 2 (two) times daily as needed (for psoriasis).       Allergies:  Allergies  Allergen Reactions  . Sulfa Antibiotics Anaphylaxis  . Amoxicillin-Pot Clavulanate Diarrhea and Nausea Only    Stomach pains  . Azithromycin Nausea Only and Other (See Comments)    Confusion also  . Trazodone And Nefazodone Other (See Comments)    Disorientation and sleep deprivation  . Mirtazapine Other (See Comments)    Dizzy  . 5-Methoxypsoralen Other (See Comments)    Altered mental status Used to treat psoriasis and/or vitiligo  . Aspirin Other (See Comments)    EARS START TO RING   . Ceftriaxone Nausea Only  . Diphenhydramine Other (See Comments)    Confusion  . Flexeril [Cyclobenzaprine] Nausea Only  . Levofloxacin Other (See Comments)    Causes pain in calf of leg when walking  Levaquin  . Oxycodone Other (See Comments)    Causes heavy drowsiness    Family History: Family History  Problem Relation Age of Onset  . Kidney disease Neg Hx   . Bladder Cancer Neg Hx   . Breast cancer Neg Hx     Social History:  reports that she has never smoked. She has never used smokeless tobacco. She reports that she does not drink alcohol or use drugs.   Physical  Exam: BP (!) 148/74   Pulse 67   Ht 5\' 1"  (1.549 m)   Wt 84 lb (38.1 kg)   BMI 15.87 kg/m   Constitutional:  Alert and oriented, No acute distress.  Appears younger than stated age.  Accompanied by care taker.   HEENT: [place] AT, moist mucus membranes.  Trachea midline, no masses. Cardiovascular: No clubbing, cyanosis, or edema. Respiratory: Normal respiratory effort, no increased work of breathing. Skin: No rashes, bruises or suspicious lesions. Neurologic: Grossly intact, no focal deficits, moving all 4 extremities. Psychiatric: Normal mood and affect.  Assessment & Plan:    1. Congenital right UPJ obstruction Managed with chronic indwelling right ureteral stent, metal exchanged annually. She is due for stent exchange. Preop urine culture today. She understands the risks of surgery including risk of infection, damage to surrounding structures, stent discomfort, sepsis, and anesthetic complications.    Return in 11 months for f/u   2. CKD, stage III Baseline   [facility] [address] [place], [place] [address] [phone]  I, Lucas Mallow, am acting as a scribe for Dr. Hollice Espy,  I have reviewed the above documentation for accuracy and completeness, and I agree with the above.   Hollice Espy, MD

## 2020-03-28 ENCOUNTER — Ambulatory Visit (INDEPENDENT_AMBULATORY_CARE_PROVIDER_SITE_OTHER): Payer: Medicare Other | Admitting: Urology

## 2020-03-28 ENCOUNTER — Other Ambulatory Visit: Payer: Self-pay

## 2020-03-28 ENCOUNTER — Other Ambulatory Visit: Payer: Self-pay | Admitting: Radiology

## 2020-03-28 VITALS — BP 148/74 | HR 67 | Ht 61.0 in | Wt 84.0 lb

## 2020-03-28 DIAGNOSIS — N135 Crossing vessel and stricture of ureter without hydronephrosis: Secondary | ICD-10-CM

## 2020-03-28 DIAGNOSIS — N1831 Chronic kidney disease, stage 3a: Secondary | ICD-10-CM | POA: Diagnosis not present

## 2020-03-29 LAB — URINALYSIS, COMPLETE
Bilirubin, UA: NEGATIVE
Glucose, UA: NEGATIVE
Ketones, UA: NEGATIVE
Nitrite, UA: NEGATIVE
RBC, UA: NEGATIVE
Specific Gravity, UA: 1.015 (ref 1.005–1.030)
Urobilinogen, Ur: 0.2 mg/dL (ref 0.2–1.0)
pH, UA: 7 (ref 5.0–7.5)

## 2020-03-29 LAB — MICROSCOPIC EXAMINATION: WBC, UA: >30 /hpf — AB (ref 0–5)

## 2020-04-01 LAB — CULTURE, URINE COMPREHENSIVE

## 2020-04-12 ENCOUNTER — Encounter
Admission: RE | Admit: 2020-04-12 | Discharge: 2020-04-12 | Disposition: A | Discharge status: Home / Self Care | Payer: Medicare Other | Source: Ambulatory Visit | Attending: Urology | Admitting: Urology

## 2020-04-12 ENCOUNTER — Other Ambulatory Visit: Payer: Self-pay

## 2020-04-12 NOTE — Patient Instructions (Signed)
Your procedure is scheduled on: Monday 04/24/20.  Report to DAY SURGERY DEPARTMENT LOCATED ON 2ND FLOOR MEDICAL MALL ENTRANCE. To find out your arrival time please call [phone] between 1PM - 3PM on Friday 04/21/20.   Remember: Instructions that are not followed completely may result in serious medical risk, up to and including death, or upon the discretion of your surgeon and anesthesiologist your surgery may need to be rescheduled.      _X__ 1. Do not eat food after midnight the night before your procedure.                 No gum chewing or hard candies. You may drink clear liquids up to 2 hours                 before you are scheduled to arrive for your surgery- DO NOT drink clear                 liquids within 2 hours of the start of your surgery.                 Clear Liquids include:  water, apple juice without pulp, clear carbohydrate                 drink such as Clearfast or Gatorade, Black Coffee or Tea (Do not add                 anything to coffee or tea).   __X__2.  On the morning of surgery brush your teeth with toothpaste and water, you may rinse your mouth with mouthwash if you wish.  Do not swallow any toothpaste or mouthwash.     __X__3.  Notify your doctor if there is any change in your medical condition      (cold, fever, infections).       Do not wear jewelry, make-up, hairpins, clips or nail polish. Do not wear lotions, powders, or perfumes.  Do not shave 48 hours prior to surgery. Men may shave face and neck. Do not bring valuables to the hospital.      [facility] is not responsible for any belongings or valuables.   Contacts, dentures/partials or body piercings may not be worn into surgery. Bring a case for your contacts, glasses or hearing aids, a denture cup will be supplied.     Patients discharged the day of surgery will not be allowed to drive home.     __X__ Take these medicines the morning of surgery with A SIP OF WATER:     1.  citalopram (CELEXA)  2. Timolol Maleate 0.5 % (DAILY) SOLN  3. acetaminophen (TYLENOL) if needed       __X__ Stop Anti-inflammatories 7 days before surgery such as Advil, Ibuprofen, Motrin, BC or Goodies Powder, Naprosyn, Naproxen, Aleve, Aspirin, Meloxicam. May take Tylenol if needed for pain or discomfort.    __X__ Stop taking Omega-3 Fatty Acids (FISH OIL) on Monday 04/17/20. You may resume after surgery.   Don't start taking any new herbal supplements or vitamins prior to your procedure

## 2020-04-13 ENCOUNTER — Other Ambulatory Visit: Payer: Self-pay

## 2020-04-13 DIAGNOSIS — N135 Crossing vessel and stricture of ureter without hydronephrosis: Secondary | ICD-10-CM

## 2020-04-14 ENCOUNTER — Other Ambulatory Visit: Payer: Medicare Other

## 2020-04-14 ENCOUNTER — Encounter
Admission: RE | Admit: 2020-04-14 | Discharge: 2020-04-14 | Disposition: A | Discharge status: Home / Self Care | Payer: Medicare Other | Source: Ambulatory Visit | Attending: Urology | Admitting: Urology

## 2020-04-14 ENCOUNTER — Other Ambulatory Visit: Payer: Self-pay

## 2020-04-14 DIAGNOSIS — I10 Essential (primary) hypertension: Secondary | ICD-10-CM | POA: Insufficient documentation

## 2020-04-14 DIAGNOSIS — Z01818 Encounter for other preprocedural examination: Secondary | ICD-10-CM | POA: Diagnosis present

## 2020-04-14 DIAGNOSIS — N135 Crossing vessel and stricture of ureter without hydronephrosis: Secondary | ICD-10-CM

## 2020-04-14 LAB — URINALYSIS, COMPLETE
Bilirubin, UA: NEGATIVE
Glucose, UA: NEGATIVE
Ketones, UA: NEGATIVE
Nitrite, UA: NEGATIVE
RBC, UA: NEGATIVE
Specific Gravity, UA: 1.02 (ref 1.005–1.030)
Urobilinogen, Ur: 0.2 mg/dL (ref 0.2–1.0)
pH, UA: 7.5 (ref 5.0–7.5)

## 2020-04-14 LAB — MICROSCOPIC EXAMINATION

## 2020-04-14 LAB — BASIC METABOLIC PANEL
Anion gap: 8 (ref 5–15)
BUN: 50 mg/dL — ABNORMAL HIGH (ref 8–23)
CO2: 26 mmol/L (ref 22–32)
Calcium: 9.1 mg/dL (ref 8.9–10.3)
Chloride: 100 mmol/L (ref 98–111)
Creatinine, Ser: 1.67 mg/dL — ABNORMAL HIGH (ref 0.44–1.00)
GFR calc Af Amer: 30 mL/min — ABNORMAL LOW (ref >60)
GFR calc non Af Amer: 26 mL/min — ABNORMAL LOW (ref >60)
Glucose, Bld: 92 mg/dL (ref 70–99)
Potassium: 5.1 mmol/L (ref 3.5–5.1)
Sodium: 134 mmol/L — ABNORMAL LOW (ref 135–145)

## 2020-04-14 LAB — CBC
HCT: 35.5 % — ABNORMAL LOW (ref 36.0–46.0)
Hemoglobin: 11.6 g/dL — ABNORMAL LOW (ref 12.0–15.0)
MCH: 31.8 pg (ref 26.0–34.0)
MCHC: 32.7 g/dL (ref 30.0–36.0)
MCV: 97.3 fL (ref 80.0–100.0)
Platelets: 279 10*3/uL (ref 150–400)
RBC: 3.65 MIL/uL — ABNORMAL LOW (ref 3.87–5.11)
RDW: 13.1 % (ref 11.5–15.5)
WBC: 5.4 10*3/uL (ref 4.0–10.5)
nRBC: 0 % (ref 0.0–0.2)

## 2020-04-19 LAB — CULTURE, URINE COMPREHENSIVE

## 2020-04-20 ENCOUNTER — Other Ambulatory Visit: Payer: Self-pay

## 2020-04-20 ENCOUNTER — Telehealth: Payer: Self-pay | Admitting: *Deleted

## 2020-04-20 ENCOUNTER — Telehealth: Payer: Self-pay | Admitting: Radiology

## 2020-04-20 ENCOUNTER — Other Ambulatory Visit
Admission: RE | Admit: 2020-04-20 | Discharge: 2020-04-20 | Disposition: A | Discharge status: Home / Self Care | Payer: Medicare Other | Source: Ambulatory Visit | Attending: Urology | Admitting: Urology

## 2020-04-20 DIAGNOSIS — Z20822 Contact with and (suspected) exposure to covid-19: Secondary | ICD-10-CM | POA: Diagnosis not present

## 2020-04-20 DIAGNOSIS — N135 Crossing vessel and stricture of ureter without hydronephrosis: Secondary | ICD-10-CM

## 2020-04-20 DIAGNOSIS — Z01812 Encounter for preprocedural laboratory examination: Secondary | ICD-10-CM | POA: Insufficient documentation

## 2020-04-20 LAB — SARS CORONAVIRUS 2 (TAT 6-24 HRS): SARS Coronavirus 2: NEGATIVE

## 2020-04-20 MED ORDER — CIPROFLOXACIN HCL 250 MG PO TABS: 250.0000 mg | ORAL_TABLET | Freq: Two times a day (BID) | ORAL | 0 refills | Status: DC

## 2020-04-20 NOTE — Telephone Encounter (Signed)
Niece, Skeet Latch, notified of positive urine culture & script sent to pharmacy.

## 2020-04-20 NOTE — Telephone Encounter (Signed)
Sherri from [facility] notified. Voiced understanding.

## 2020-04-20 NOTE — Telephone Encounter (Signed)
Received a call from [facility] regarding Cipro, patient has a reaction to Levaquin. Ok to still fill?

## 2020-04-20 NOTE — Telephone Encounter (Signed)
-----   Message from Hollice Espy, MD sent at 04/19/2020  4:03 PM EDT ----- Please treat with cipro 250 bid x 5 days starting 3 days PRIOR to stent exchange  Hollice Espy, MD

## 2020-04-20 NOTE — Telephone Encounter (Signed)
Yes

## 2020-04-24 ENCOUNTER — Ambulatory Visit
Admission: RE | Admit: 2020-04-24 | Discharge: 2020-04-24 | Disposition: A | Discharge status: Home / Self Care | Payer: Medicare Other | Attending: Urology | Admitting: Urology

## 2020-04-24 ENCOUNTER — Ambulatory Visit: Payer: Medicare Other | Admitting: Certified Registered"

## 2020-04-24 ENCOUNTER — Other Ambulatory Visit: Payer: Self-pay

## 2020-04-24 ENCOUNTER — Encounter
Admission: RE | Disposition: A | Discharge status: Home / Self Care | Payer: Self-pay | Source: Home / Self Care | Attending: Urology

## 2020-04-24 ENCOUNTER — Ambulatory Visit: Payer: Medicare Other

## 2020-04-24 ENCOUNTER — Encounter: Payer: Self-pay | Admitting: Urology

## 2020-04-24 DIAGNOSIS — N135 Crossing vessel and stricture of ureter without hydronephrosis: Secondary | ICD-10-CM

## 2020-04-24 DIAGNOSIS — Z87440 Personal history of urinary (tract) infections: Secondary | ICD-10-CM | POA: Insufficient documentation

## 2020-04-24 DIAGNOSIS — I129 Hypertensive chronic kidney disease with stage 1 through stage 4 chronic kidney disease, or unspecified chronic kidney disease: Secondary | ICD-10-CM | POA: Insufficient documentation

## 2020-04-24 DIAGNOSIS — Z79899 Other long term (current) drug therapy: Secondary | ICD-10-CM | POA: Insufficient documentation

## 2020-04-24 DIAGNOSIS — F419 Anxiety disorder, unspecified: Secondary | ICD-10-CM | POA: Insufficient documentation

## 2020-04-24 DIAGNOSIS — N184 Chronic kidney disease, stage 4 (severe): Secondary | ICD-10-CM | POA: Insufficient documentation

## 2020-04-24 DIAGNOSIS — Z87442 Personal history of urinary calculi: Secondary | ICD-10-CM | POA: Insufficient documentation

## 2020-04-24 DIAGNOSIS — Q6239 Other obstructive defects of renal pelvis and ureter: Secondary | ICD-10-CM | POA: Diagnosis not present

## 2020-04-24 DIAGNOSIS — Z85828 Personal history of other malignant neoplasm of skin: Secondary | ICD-10-CM | POA: Insufficient documentation

## 2020-04-24 HISTORY — PX: CYSTOSCOPY W/ URETERAL STENT PLACEMENT: SHX1429

## 2020-04-24 HISTORY — PX: CYSTOSCOPY W/ RETROGRADES: SHX1426

## 2020-04-24 SURGERY — CYSTOSCOPY, FLEXIBLE, WITH STENT REPLACEMENT
Anesthesia: General | Laterality: Right

## 2020-04-24 MED ORDER — ONDANSETRON HCL 4 MG/2ML IJ SOLN
INTRAMUSCULAR | Status: AC
  Filled 2020-04-24: qty 2

## 2020-04-24 MED ORDER — DEXAMETHASONE SODIUM PHOSPHATE 10 MG/ML IJ SOLN
INTRAMUSCULAR | Status: DC | PRN
  Administered 2020-04-24: 4 mg via INTRAVENOUS

## 2020-04-24 MED ORDER — FAMOTIDINE 20 MG PO TABS: 20.0000 mg | ORAL_TABLET | Freq: Once | ORAL | Status: AC

## 2020-04-24 MED ORDER — DEXAMETHASONE SODIUM PHOSPHATE 10 MG/ML IJ SOLN
INTRAMUSCULAR | Status: AC
  Filled 2020-04-24: qty 1

## 2020-04-24 MED ORDER — FAMOTIDINE 20 MG PO TABS
ORAL_TABLET | ORAL | Status: AC
  Administered 2020-04-24: 20 mg via ORAL
  Filled 2020-04-24: qty 1

## 2020-04-24 MED ORDER — IOHEXOL 180 MG/ML  SOLN
INTRAMUSCULAR | Status: DC | PRN
  Administered 2020-04-24: 5 mL

## 2020-04-24 MED ORDER — LIDOCAINE HCL (PF) 2 % IJ SOLN
INTRAMUSCULAR | Status: AC
  Filled 2020-04-24: qty 5

## 2020-04-24 MED ORDER — LIDOCAINE HCL (CARDIAC) PF 100 MG/5ML IV SOSY
PREFILLED_SYRINGE | INTRAVENOUS | Status: DC | PRN
  Administered 2020-04-24: 20 mg via INTRAVENOUS

## 2020-04-24 MED ORDER — CHLORHEXIDINE GLUCONATE 0.12 % MT SOLN
OROMUCOSAL | Status: AC
  Administered 2020-04-24: 15 mL via OROMUCOSAL
  Filled 2020-04-24: qty 15

## 2020-04-24 MED ORDER — ORAL CARE MOUTH RINSE: 15.0000 mL | Freq: Once | OROMUCOSAL | Status: AC

## 2020-04-24 MED ORDER — CEFAZOLIN SODIUM-DEXTROSE 1-4 GM/50ML-% IV SOLN
INTRAVENOUS | Status: AC
  Filled 2020-04-24: qty 50

## 2020-04-24 MED ORDER — PROPOFOL 10 MG/ML IV BOLUS
INTRAVENOUS | Status: DC | PRN
  Administered 2020-04-24: 60 mg via INTRAVENOUS
  Administered 2020-04-24: 40 mg via INTRAVENOUS

## 2020-04-24 MED ORDER — ONDANSETRON HCL 4 MG/2ML IJ SOLN
INTRAMUSCULAR | Status: DC | PRN
  Administered 2020-04-24: 4 mg via INTRAVENOUS

## 2020-04-24 MED ORDER — CHLORHEXIDINE GLUCONATE 0.12 % MT SOLN: 15.0000 mL | Freq: Once | OROMUCOSAL | Status: AC

## 2020-04-24 MED ORDER — LACTATED RINGERS IV SOLN
INTRAVENOUS | Status: DC
  Administered 2020-04-24: 50 mL/h via INTRAVENOUS

## 2020-04-24 MED ORDER — PROPOFOL 10 MG/ML IV BOLUS
INTRAVENOUS | Status: AC
  Filled 2020-04-24: qty 20

## 2020-04-24 MED ORDER — CEFAZOLIN SODIUM-DEXTROSE 1-4 GM/50ML-% IV SOLN
1.0000 g | INTRAVENOUS | Status: AC
  Administered 2020-04-24: 1 g via INTRAVENOUS

## 2020-04-24 SURGICAL SUPPLY — 19 items
BAG DRAIN CYSTO-URO LG1000N (MISCELLANEOUS) ×3 IMPLANT
BRUSH SCRUB EZ 1% IODOPHOR (MISCELLANEOUS) ×3 IMPLANT
CATH URETL 5X70 OPEN END (CATHETERS) ×3 IMPLANT
GLOVE BIO SURGEON STRL SZ 6.5 (GLOVE) ×2 IMPLANT
GLOVE BIO SURGEONS STRL SZ 6.5 (GLOVE) ×1
GOWN STRL REUS W/ TWL LRG LVL3 (GOWN DISPOSABLE) ×2 IMPLANT
GOWN STRL REUS W/TWL LRG LVL3 (GOWN DISPOSABLE) ×4
GUIDEWIRE STR DUAL SENSOR (WIRE) ×3 IMPLANT
KIT TURNOVER CYSTO (KITS) ×3 IMPLANT
PACK CYSTO AR (MISCELLANEOUS) ×3 IMPLANT
SET CYSTO W/LG BORE CLAMP LF (SET/KITS/TRAYS/PACK) ×3 IMPLANT
SOL .9 NS 3000ML IRR  AL (IV SOLUTION) ×2
SOL .9 NS 3000ML IRR UROMATIC (IV SOLUTION) ×1 IMPLANT
STENT URET 6FRX24 CONTOUR (STENTS) IMPLANT
STENT URET 6FRX26 CONTOUR (STENTS) IMPLANT
STENT URETERAL METAL 6X22 (STENTS) ×3 IMPLANT
SURGILUBE 2OZ TUBE FLIPTOP (MISCELLANEOUS) ×3 IMPLANT
SYRINGE IRR TOOMEY STRL 70CC (SYRINGE) ×3 IMPLANT
WATER STERILE IRR 1000ML POUR (IV SOLUTION) ×3 IMPLANT

## 2020-04-24 NOTE — Discharge Instructions (Addendum)
You have a ureteral stent in place.  This is a tube that extends from your kidney to your bladder.  This may cause urinary bleeding, burning with urination, and urinary frequency.  Please call our office or present to the ED if you develop fevers >101 or pain which is not able to be controlled with oral pain medications.  You may be given either Flomax and/ or ditropan to help with bladder spasms and stent pain in addition to pain medications.   ° ° [facility] °[address], [address] °, [place] [address] °[phone] ° ° ° °AMBULATORY SURGERY  °DISCHARGE INSTRUCTIONS ° ° °1) The drugs that you were given will stay in your system until tomorrow so for the next 24 hours you should not: ° °A) Drive an automobile °B) Make any legal decisions °C) Drink any alcoholic beverage ° ° °2) You may resume regular meals tomorrow.  Today it is better to start with liquids and gradually work up to solid foods. ° °You may eat anything you prefer, but it is better to start with liquids, then soup and crackers, and gradually work up to solid foods. ° ° °3) Please notify your doctor immediately if you have any unusual bleeding, trouble breathing, redness and pain at the surgery site, drainage, fever, or pain not relieved by medication. ° ° ° °4) Additional Instructions: ° ° ° ° ° ° ° °Please contact your physician with any problems or Same Day Surgery at [phone], Monday through Friday 6 am to 4 pm, or [place] at [place] Main number at [phone]. °

## 2020-04-24 NOTE — Anesthesia Procedure Notes (Signed)
Procedure Name: LMA Insertion Date/Time: 04/24/2020 11:55 AM Performed by: Lerry Liner, CRNA Pre-anesthesia Checklist: Patient identified, Emergency Drugs available, Suction available, Patient being monitored and Timeout performed Patient Re-evaluated:Patient Re-evaluated prior to induction Oxygen Delivery Method: Circle system utilized Preoxygenation: Pre-oxygenation with 100% oxygen Induction Type: IV induction LMA: LMA inserted LMA Size: 3.0 Number of attempts: 1 Placement Confirmation: ETT inserted through vocal cords under direct vision Tube secured with: Tape Dental Injury: Teeth and Oropharynx as per pre-operative assessment

## 2020-04-24 NOTE — Anesthesia Preprocedure Evaluation (Signed)
Anesthesia Evaluation  Patient identified by MRN, date of birth, ID band Patient awake    Reviewed: Allergy & Precautions, H&P , NPO status , Patient's Chart, lab work & pertinent test results  History of Anesthesia Complications (+) PONV and history of anesthetic complications  Airway Mallampati: III   Neck ROM: limited    Dental  (+) Chipped, Poor Dentition   Pulmonary neg pulmonary ROS, neg shortness of breath, neg COPD,           Cardiovascular Exercise Tolerance: Good hypertension, Pt. on medications (-) angina(-) Past MI and (-) DOE      Neuro/Psych PSYCHIATRIC DISORDERS Anxiety negative neurological ROS     GI/Hepatic negative GI ROS, Neg liver ROS,   Endo/Other  negative endocrine ROS  Renal/GU CRFRenal disease     Musculoskeletal   Abdominal   Peds  Hematology negative hematology ROS (+)   Anesthesia Other Findings    Reproductive/Obstetrics negative OB ROS                            Anesthesia Physical  Anesthesia Plan  ASA: III  Anesthesia Plan: General   Post-op Pain Management:    Induction: Intravenous  PONV Risk Score and Plan: Dexamethasone, Ondansetron and Treatment may vary due to age or medical condition  Airway Management Planned: LMA  Additional Equipment:   Intra-op Plan:   Post-operative Plan: Extubation in OR  Informed Consent: I have reviewed the patients History and Physical, chart, labs and discussed the procedure including the risks, benefits and alternatives for the proposed anesthesia with the patient or authorized representative who has indicated his/her understanding and acceptance.       Plan Discussed with:   Anesthesia Plan Comments: (Patient consented for risks of anesthesia including but not limited to:  - adverse reactions to medications - damage to teeth, lips or other oral mucosa - sore throat or hoarseness - Damage to  heart, brain, lungs or loss of life  Patient voiced understanding.)        Anesthesia Quick Evaluation

## 2020-04-24 NOTE — Transfer of Care (Deleted)
Immediate Anesthesia Transfer of Care Note  Patient: April Green  Procedure(s) Performed: CYSTOSCOPY WITH STENT REPLACEMENT (Right )  Patient Location: PACU  Anesthesia Type:General  Level of Consciousness: drowsy  Airway & Oxygen Therapy: Patient Spontanous Breathing and Patient connected to face mask oxygen  Post-op Assessment: Report given to RN  Post vital signs: stable  Last Vitals:  Vitals Value Taken Time  BP    Temp    Pulse    Resp    SpO2      Last Pain:  Vitals:   04/24/20 0909  TempSrc: Tympanic  PainSc: 0-No pain         Complications: No complications documented.

## 2020-04-24 NOTE — Interval H&P Note (Signed)
History and Physical Interval Note:  04/24/2020 11:27 AM  April Green  has presented today for surgery, with the diagnosis of right upj obstruction .  The various methods of treatment have been discussed with the patient and family. After consideration of risks, benefits and other options for treatment, the patient has consented to  Procedure(s) with comments: CYSTOSCOPY WITH STENT REPLACEMENT (Right) - metal stent as a surgical intervention.  The patient's history has been reviewed, patient examined, no change in status, stable for surgery.  I have reviewed the patient's chart and labs.  Questions were answered to the patient's satisfaction.    [facility]  Hollice Espy

## 2020-04-24 NOTE — Op Note (Signed)
Date of procedure: 04/24/20   Preoperative diagnosis:  1. Right UPJ obstruction 2. Chronic right indwelling ureteral stent  Postoperative diagnosis:  1. Same as above  Procedure: 1. Cystoscopy 2. Right retrograde pyelogram 3. Right ureteral stent exchange with metallic stent  Surgeon: Hollice Espy, MD  Anesthesia: General  Complications: None  Intraoperative findings: Right ureteral stent exchange without difficulty today.  EBL: Normal  Specimens: None  Drains: 6 x 82NOIBBC double-J metallic right ureteral stent  Indication: April T Simmonsis a 86y.o.patient with chronic right UPJ obstruction managed with annual metal stent exchanges.Her stent was last changed approximately one year ago.After reviewing the management options for treatment, she elected to proceed with the above surgical procedure(s). We have discussed the potential benefits and risks of the procedure, side effects of the proposed treatment, the likelihood of the patient achieving the goals of the procedure, and any potential problems that might occur during the procedure or recuperation. Informed consent has been obtained.  Description of procedure:  The patient was taken to the operating room and general anesthesia was induced. The patient was placed in the dorsal lithotomy position, prepped and draped in the usual sterile fashion, and preoperative antibiotics were administered. A preoperative time-out was performed.   A 21 French rigid cystoscopy was advanced per urethra into the bladder. Attention was turned to the right ureteral orifice from which a indwelling metal ureteral stent was identified. There is no encrustation on the stent. The remainder of the bladder appeared relatively normal. The distal coil of the stent was grasped using stent graspers and it easily removed under fluoroscopic guidance.The open-ended ureteral catheter was placed just within the UO and a gentle  retrograde pyelogram was performed which showed a decompressed ureter and upper tract collecting system. There is no extravasation noted. A sensor wire was then placed up to level of the kidney without difficulty. The sheath with introducer from the metal stent was advanced up to level of the renal pelvis using the distal marker to assess its position. The inner lumen was then removed. The metal stent was then advanced through the introducer sheath up to level of the renal pelvis. A pressure was then used to create a coil within the renal pelvis and a Seldinger technique was then used to remove the sheath leaving the stent in place. A full coil was noted within the bladder once the introducer sheath was completely removed. The scope was reintroduced into the bladder to ensure adequate position of the stent and distal coil which appeared to be a full coil in satisfactory position. The bladder was then drained and the scope was removed. She was repositioned the supine position, reversed from anesthesia, and taken to the PACU in stable condition.  Plan: Patient will follow up in 11 months to arrange for her next stent exchange in one year.

## 2020-04-24 NOTE — Anesthesia Postprocedure Evaluation (Signed)
Anesthesia Post Note  Patient: April Green  Procedure(s) Performed: CYSTOSCOPY WITH STENT REPLACEMENT (Right ) CYSTOSCOPY WITH RETROGRADE PYELOGRAM (Right )  Patient location during evaluation: PACU Anesthesia Type: General Level of consciousness: awake and alert Pain management: pain level controlled Vital Signs Assessment: post-procedure vital signs reviewed and stable Respiratory status: spontaneous breathing, nonlabored ventilation, respiratory function stable and patient connected to nasal cannula oxygen Cardiovascular status: blood pressure returned to baseline and stable Postop Assessment: no apparent nausea or vomiting Anesthetic complications: no   No complications documented.   Last Vitals:  Vitals:   04/24/20 1329 04/24/20 1346  BP: (!) 151/76 (!) 159/55  Pulse: 62 60  Resp: 16 16  Temp: (!) 36.3 C   SpO2: 96% 99%    Last Pain:  Vitals:   04/24/20 1329  TempSrc: Temporal  PainSc:                  Martha Clan

## 2020-04-24 NOTE — Transfer of Care (Signed)
Immediate Anesthesia Transfer of Care Note  Patient: April Green  Procedure(s) Performed: CYSTOSCOPY WITH STENT REPLACEMENT (Right ) CYSTOSCOPY WITH RETROGRADE PYELOGRAM (Right )  Patient Location: PACU  Anesthesia Type:General  Level of Consciousness: awake and sedated  Airway & Oxygen Therapy: Patient Spontanous Breathing and Patient connected to face mask oxygen  Post-op Assessment: Report given to RN and Post -op Vital signs reviewed and stable  Post vital signs: Reviewed and stable  Last Vitals:  Vitals Value Taken Time  BP    Temp    Pulse 74 04/24/20 1230  Resp 13 04/24/20 1230  SpO2 100 % 04/24/20 1230  Vitals shown include unvalidated device data.  Last Pain:  Vitals:   04/24/20 0909  TempSrc: Tympanic  PainSc: 0-No pain         Complications: No complications documented.

## 2020-04-25 ENCOUNTER — Encounter: Payer: Self-pay | Admitting: Urology

## 2020-05-19 ENCOUNTER — Ambulatory Visit (INDEPENDENT_AMBULATORY_CARE_PROVIDER_SITE_OTHER): Payer: Medicare Other | Admitting: Physician Assistant

## 2020-05-19 ENCOUNTER — Other Ambulatory Visit: Payer: Self-pay

## 2020-05-19 ENCOUNTER — Encounter: Payer: Self-pay | Admitting: Physician Assistant

## 2020-05-19 VITALS — BP 143/65 | HR 59 | Ht 61.0 in | Wt 80.0 lb

## 2020-05-19 DIAGNOSIS — R300 Dysuria: Secondary | ICD-10-CM | POA: Diagnosis not present

## 2020-05-19 MED ORDER — CIPROFLOXACIN HCL 250 MG PO TABS: 250.0000 mg | ORAL_TABLET | Freq: Two times a day (BID) | ORAL | 0 refills | Status: DC

## 2020-05-19 NOTE — Progress Notes (Signed)
05/19/2020 11:14 AM   Bertram Millard Tobie Poet 04/04/1923 [id number]  CC: Chief Complaint  Patient presents with  . Dysuria   HPI: April Green is a 86 y.o. female with PMH congenital right UPJ obstruction managed with a chronic indwelling metallic ureteral stent, last stent exchange with Dr. [name] on 04/24/2020, who presents today for evaluation of possible UTI.   Today, she reports a 1-week history of dysuria with twinges of pain in her left flank with urination. She has been pushing fluids and has noticed increased frequency as a result of this. She denies urgency, fever, and chills. She has had one episode of nausea without emesis.  In-office UA today positive for trace protein and 2+ leukocyte esterase; urine microscopy with >30 WBCs/HPF.  PMH: Past Medical History:  Diagnosis Date  . Acute cystitis   . Anxiety   . Cancer ([place])    skin cancer  . Chronic UTI   . Glaucoma   . History of kidney stones   . Hydronephrosis    stage IV ckd  . Hypertension   . Incomplete bladder emptying   . Macular degeneration   . PONV (postoperative nausea and vomiting)    nausea only  . Renal cyst   . SUI (stress urinary incontinence, female)   . Unsteady gait   . UPJ obstruction, congenital   . Vertigo    Surgical History: Past Surgical History:  Procedure Laterality Date  . ABDOMINAL HYSTERECTOMY    . cysto stent exchange     once a year for last 6 years. metal stent  . CYSTOSCOPY W/ RETROGRADES Right 02/16/2018   Procedure: CYSTOSCOPY WITH RETROGRADE PYELOGRAM;  Surgeon: Hollice Espy, MD;  Location: [facility] ORS;  Service: Urology;  Laterality: Right;  . CYSTOSCOPY W/ RETROGRADES Right 04/24/2020   Procedure: CYSTOSCOPY WITH RETROGRADE PYELOGRAM;  Surgeon: Hollice Espy, MD;  Location: [facility];  Service: Urology;  Laterality: Right;  . CYSTOSCOPY W/ URETERAL STENT PLACEMENT Right 01/01/2016   Procedure: CYSTOSCOPY WITH STENT REPLACEMENT;  Surgeon: Hollice Espy, MD;  Location: [facility]  ORS;  Service: Urology;  Laterality: Right;  . CYSTOSCOPY W/ URETERAL STENT PLACEMENT Right 02/18/2017   Procedure: CYSTOSCOPY WITH STENT REPLACEMENT;  Surgeon: Hollice Espy, MD;  Location: [facility];  Service: Urology;  Laterality: Right;  . CYSTOSCOPY W/ URETERAL STENT PLACEMENT Right 02/16/2018   Procedure: CYSTOSCOPY WITH STENT REPLACEMENT;  Surgeon: Hollice Espy, MD;  Location: [facility];  Service: Urology;  Laterality: Right;  . CYSTOSCOPY W/ URETERAL STENT PLACEMENT Right 06/07/2019   Procedure: CYSTOSCOPY WITH STENT REPLACEMENT;  Surgeon: Hollice Espy, MD;  Location: [facility];  Service: Urology;  Laterality: Right;  . CYSTOSCOPY W/ URETERAL STENT PLACEMENT Right 04/24/2020   Procedure: CYSTOSCOPY WITH STENT REPLACEMENT;  Surgeon: Hollice Espy, MD;  Location: [facility];  Service: Urology;  Laterality: Right;  metal stent  . EYE SURGERY Bilateral    cataract extractions  . HERNIA REPAIR Left 05/2008   inguinal hernia    Home Medications:  Allergies as of 05/19/2020      Reactions   Sulfa Antibiotics Anaphylaxis   Amoxicillin-pot Clavulanate Diarrhea, Nausea Only   Stomach pains   Azithromycin Nausea Only, Other (See Comments)   Confusion also   Trazodone And Nefazodone Other (See Comments)   Disorientation and sleep deprivation   Mirtazapine Other (See Comments)   Dizzy   5-methoxypsoralen Other (See Comments)   Altered mental status Used to treat psoriasis and/or vitiligo   Aspirin Other (See Comments)  EARS START TO RING   Ceftriaxone Nausea Only   Diphenhydramine Other (See Comments)   Confusion   Flexeril [cyclobenzaprine] Nausea Only   Levofloxacin Other (See Comments)   Causes pain in calf of leg when walking Levaquin   Oxycodone Other (See Comments)   Causes heavy drowsiness      Medication List       Accurate as of May 19, 2020 11:14 AM. If you have any questions, ask your nurse or doctor.        STOP taking these medications   Garlic 4696 MG  Caps Stopped by: Debroah Loop, PA-C     TAKE these medications   acetaminophen 325 MG tablet Commonly known as: TYLENOL Take 325-650 mg by mouth every 6 (six) hours as needed for moderate pain.   amLODipine 10 MG tablet Commonly known as: NORVASC Take 10 mg by mouth at bedtime.   ciprofloxacin 250 MG tablet Commonly known as: CIPRO Take 1 tablet (250 mg total) by mouth 2 (two) times daily for 5 days. What changed: when to take this Changed by: Debroah Loop, PA-C   citalopram 10 MG tablet Commonly known as: CELEXA Take 5 mg by mouth daily.   estradiol 0.1 MG/GM vaginal cream Commonly known as: ESTRACE VAGINAL Apply 0.5mg  (pea-sized amount)  just inside the vaginal introitus with a finger-tip every night for two weeks and then Monday, Wednesday and Friday nights. What changed:   how much to take  how to take this  when to take this  additional instructions   Fish Oil 1000 MG Caps Take 1,000 mg by mouth daily.   latanoprost 0.005 % ophthalmic solution Commonly known as: XALATAN Place 1 drop into both eyes at bedtime.   LORazepam 0.5 MG tablet Commonly known as: ATIVAN Take 0.5 mg by mouth at bedtime.   losartan 25 MG tablet Commonly known as: COZAAR Take 25 mg by mouth daily.   meclizine 25 MG tablet Commonly known as: ANTIVERT Take 25 mg by mouth 3 (three) times daily as needed for dizziness.   Melatonin 5 MG Caps Take 5 mg by mouth at bedtime.   multivitamin capsule Take 1 capsule by mouth daily.   PRESERVISION AREDS PO Take 1 tablet by mouth 2 (two) times daily. Areds 2   timolol 0.5 % ophthalmic solution Commonly known as: TIMOPTIC 1 drop daily.   triamcinolone cream 0.1 % Commonly known as: KENALOG Apply 1 application topically 2 (two) times daily as needed (for psoriasis).       Allergies:  Allergies  Allergen Reactions  . Sulfa Antibiotics Anaphylaxis  . Amoxicillin-Pot Clavulanate Diarrhea and Nausea Only     Stomach pains  . Azithromycin Nausea Only and Other (See Comments)    Confusion also  . Trazodone And Nefazodone Other (See Comments)    Disorientation and sleep deprivation  . Mirtazapine Other (See Comments)    Dizzy  . 5-Methoxypsoralen Other (See Comments)    Altered mental status Used to treat psoriasis and/or vitiligo  . Aspirin Other (See Comments)    EARS START TO RING  . Ceftriaxone Nausea Only  . Diphenhydramine Other (See Comments)    Confusion  . Flexeril [Cyclobenzaprine] Nausea Only  . Levofloxacin Other (See Comments)    Causes pain in calf of leg when walking  Levaquin  . Oxycodone Other (See Comments)    Causes heavy drowsiness    Family History: Family History  Problem Relation Age of Onset  . Kidney disease Neg Hx   .  Bladder Cancer Neg Hx   . Breast cancer Neg Hx     Social History:   reports that she has never smoked. She has never used smokeless tobacco. She reports that she does not drink alcohol and does not use drugs.  Physical Exam: BP (!) 143/65 (BP Location: Left Arm, Patient Position: Sitting, Cuff Size: Normal)   Pulse (!) 59   Ht 5\' 1"  (1.549 m)   Wt 80 lb (36.3 kg)   BMI 15.12 kg/m   Constitutional:  Alert and oriented, no acute distress, nontoxic appearing HEENT: [place], AT Cardiovascular: No clubbing, cyanosis, or edema Respiratory: Normal respiratory effort, no increased work of breathing Skin: No rashes, bruises or suspicious lesions Neurologic: Grossly intact, no focal deficits, moving all 4 extremities Psychiatric: Normal mood and affect  Laboratory Data: Results for orders placed or performed in visit on 05/19/20  CULTURE, URINE COMPREHENSIVE   Specimen: Urine   UR  Result Value Ref Range   Urine Culture, Comprehensive Preliminary report (A)    Organism ID, Bacteria Enterococcus faecalis (A)   Microscopic Examination   Urine  Result Value Ref Range   WBC, UA >30 (A) 0 - 5 /hpf   RBC 0-2 0 - 2 /hpf   Epithelial Cells  (non renal) 0-10 0 - 10 /hpf   Bacteria, UA Few None seen/Few  Urinalysis, Complete  Result Value Ref Range   Specific Gravity, UA 1.010 1.005 - 1.030   pH, UA 7.0 5.0 - 7.5   Color, UA Yellow Yellow   Appearance Ur Cloudy (A) Clear   Leukocytes,UA 2+ (A) Negative   Protein,UA Trace (A) Negative/Trace   Glucose, UA Negative Negative   Ketones, UA Negative Negative   RBC, UA Negative Negative   Bilirubin, UA Negative Negative   Urobilinogen, Ur 0.2 0.2 - 1.0 mg/dL   Nitrite, UA Negative Negative   Microscopic Examination See below:    Assessment & Plan:   1. Dysuria UA today with pyuria consistent with acute cystitis versus stent. Will start empiric Cipro and send for culture. If culture is negative, recommend trospium for management of possible stent discomfort. - Urinalysis, Complete - CULTURE, URINE COMPREHENSIVE - ciprofloxacin (CIPRO) 250 MG tablet; Take 1 tablet (250 mg total) by mouth 2 (two) times daily for 5 days.  Dispense: 10 tablet; Refill: 0   Return if symptoms worsen or fail to improve.  Debroah Loop, PA-C  [facility] [address] [place], [place] [address] [phone]

## 2020-05-22 LAB — MICROSCOPIC EXAMINATION: WBC, UA: >30 /hpf — AB (ref 0–5)

## 2020-05-22 LAB — URINALYSIS, COMPLETE
Bilirubin, UA: NEGATIVE
Glucose, UA: NEGATIVE
Ketones, UA: NEGATIVE
Nitrite, UA: NEGATIVE
RBC, UA: NEGATIVE
Specific Gravity, UA: 1.01 (ref 1.005–1.030)
Urobilinogen, Ur: 0.2 mg/dL (ref 0.2–1.0)
pH, UA: 7 (ref 5.0–7.5)

## 2020-05-23 ENCOUNTER — Telehealth: Payer: Self-pay | Admitting: Physician Assistant

## 2020-05-23 DIAGNOSIS — R300 Dysuria: Secondary | ICD-10-CM

## 2020-05-23 LAB — CULTURE, URINE COMPREHENSIVE

## 2020-05-23 MED ORDER — CIPROFLOXACIN HCL 250 MG PO TABS: 250.0000 mg | ORAL_TABLET | Freq: Two times a day (BID) | ORAL | 0 refills | Status: AC

## 2020-05-23 NOTE — Telephone Encounter (Signed)
Notified patient as advised. Patient verbalized understanding.  

## 2020-05-23 NOTE — Telephone Encounter (Signed)
Please contact the patient and inform her that her urine culture came back positive for a UTI. She is on the right antibiotics, but I would like to extend her course from 5 to 7 days. I have sent an additional 2 days of Cipro to [place]; please counsel her to pick these up ASAP and take them continuously for a total of 7 days of antibiotics.

## 2021-03-06 ENCOUNTER — Ambulatory Visit (INDEPENDENT_AMBULATORY_CARE_PROVIDER_SITE_OTHER): Payer: Medicare Other | Admitting: Urology

## 2021-03-06 ENCOUNTER — Other Ambulatory Visit: Payer: Self-pay

## 2021-03-06 ENCOUNTER — Encounter: Payer: Self-pay | Admitting: Urology

## 2021-03-06 VITALS — BP 120/64 | HR 76 | Ht 61.0 in | Wt 80.0 lb

## 2021-03-06 DIAGNOSIS — N135 Crossing vessel and stricture of ureter without hydronephrosis: Secondary | ICD-10-CM

## 2021-03-06 LAB — URINALYSIS, COMPLETE
Bilirubin, UA: NEGATIVE
Glucose, UA: NEGATIVE
Ketones, UA: NEGATIVE
Nitrite, UA: POSITIVE — AB
Specific Gravity, UA: 1.01 (ref 1.005–1.030)
Urobilinogen, Ur: 0.2 mg/dL (ref 0.2–1.0)
pH, UA: 5.5 (ref 5.0–7.5)

## 2021-03-06 LAB — MICROSCOPIC EXAMINATION: WBC, UA: >30 /hpf — AB (ref 0–5)

## 2021-03-06 NOTE — Progress Notes (Signed)
03/06/2021 11:03 AM   Bertram Millard Tobie Poet 11-Feb-1923 [id number]  Referring provider: [name], MD [address] [facility] [place],   [id number]  Chief Complaint  Patient presents with  . Follow-up    HPI: 87 year old female with a personal history of congenital right UPJ obstruction and a chronic indwelling metallic stent who returns today to discuss her next exchange.  Her last exchange was 04/24/2020.  She did have a postprocedural urinary tract infection after her last stent exchange.  Has not had any other infections since then.  No significant urinary symptoms.  She reports that she is doing well.  She has slightly decreased energy but otherwise no changes in her medical history.  She is having a hard time keeping her weight up.  She has a good appetite.   PMH: Past Medical History:  Diagnosis Date  . Acute cystitis   . Anxiety   . Cancer ([place])    skin cancer  . Chronic UTI   . Glaucoma   . History of kidney stones   . Hydronephrosis    stage IV ckd  . Hypertension   . Incomplete bladder emptying   . Macular degeneration   . PONV (postoperative nausea and vomiting)    nausea only  . Renal cyst   . SUI (stress urinary incontinence, female)   . Unsteady gait   . UPJ obstruction, congenital   . Vertigo     Surgical History: Past Surgical History:  Procedure Laterality Date  . ABDOMINAL HYSTERECTOMY    . cysto stent exchange     once a year for last 6 years. metal stent  . CYSTOSCOPY W/ RETROGRADES Right 02/16/2018   Procedure: CYSTOSCOPY WITH RETROGRADE PYELOGRAM;  Surgeon: Hollice Espy, MD;  Location: [facility];  Service: Urology;  Laterality: Right;  . CYSTOSCOPY W/ RETROGRADES Right 04/24/2020   Procedure: CYSTOSCOPY WITH RETROGRADE PYELOGRAM;  Surgeon: Hollice Espy, MD;  Location: [facility];  Service: Urology;  Laterality: Right;  . CYSTOSCOPY W/ URETERAL STENT PLACEMENT Right 01/01/2016   Procedure: CYSTOSCOPY WITH STENT  REPLACEMENT;  Surgeon: Hollice Espy, MD;  Location: [facility];  Service: Urology;  Laterality: Right;  . CYSTOSCOPY W/ URETERAL STENT PLACEMENT Right 02/18/2017   Procedure: CYSTOSCOPY WITH STENT REPLACEMENT;  Surgeon: Hollice Espy, MD;  Location: [facility];  Service: Urology;  Laterality: Right;  . CYSTOSCOPY W/ URETERAL STENT PLACEMENT Right 02/16/2018   Procedure: CYSTOSCOPY WITH STENT REPLACEMENT;  Surgeon: Hollice Espy, MD;  Location: [facility];  Service: Urology;  Laterality: Right;  . CYSTOSCOPY W/ URETERAL STENT PLACEMENT Right 06/07/2019   Procedure: CYSTOSCOPY WITH STENT REPLACEMENT;  Surgeon: Hollice Espy, MD;  Location: [facility];  Service: Urology;  Laterality: Right;  . CYSTOSCOPY W/ URETERAL STENT PLACEMENT Right 04/24/2020   Procedure: CYSTOSCOPY WITH STENT REPLACEMENT;  Surgeon: Hollice Espy, MD;  Location: [facility];  Service: Urology;  Laterality: Right;  metal stent  . EYE SURGERY Bilateral    cataract extractions  . HERNIA REPAIR Left 05/2008   inguinal hernia    Home Medications:  Allergies as of 03/06/2021      Reactions   Sulfa Antibiotics Anaphylaxis   Amoxicillin-pot Clavulanate Diarrhea, Nausea Only   Stomach pains   Azithromycin Nausea Only, Other (See Comments)   Confusion also   Trazodone And Nefazodone Other (See Comments)   Disorientation and sleep deprivation   Mirtazapine Other (See Comments)   Dizzy   5-methoxypsoralen Other (See Comments)   Altered mental status Used to  treat psoriasis and/or vitiligo   Aspirin Other (See Comments)   EARS START TO RING   Ceftriaxone Nausea Only   Diphenhydramine Other (See Comments)   Confusion   Flexeril [cyclobenzaprine] Nausea Only   Levofloxacin Other (See Comments)   Causes pain in calf of leg when walking Levaquin   Oxycodone Other (See Comments)   Causes heavy drowsiness      Medication List       Accurate as of Mar 06, 2021 11:03 AM. If you have any questions, ask your nurse or doctor.         acetaminophen 325 MG tablet Commonly known as: TYLENOL Take 325-650 mg by mouth every 6 (six) hours as needed for moderate pain.   amLODipine 10 MG tablet Commonly known as: NORVASC Take 10 mg by mouth at bedtime.   citalopram 10 MG tablet Commonly known as: CELEXA Take 5 mg by mouth daily.   estradiol 0.1 MG/GM vaginal cream Commonly known as: ESTRACE VAGINAL Apply 0.5mg  (pea-sized amount)  just inside the vaginal introitus with a finger-tip every night for two weeks and then Monday, Wednesday and Friday nights. What changed:   how much to take  how to take this  when to take this  additional instructions   Fish Oil 1000 MG Caps Take 1,000 mg by mouth daily.   latanoprost 0.005 % ophthalmic solution Commonly known as: XALATAN Place 1 drop into both eyes at bedtime.   LORazepam 0.5 MG tablet Commonly known as: ATIVAN Take 0.5 mg by mouth at bedtime.   losartan 25 MG tablet Commonly known as: COZAAR Take 25 mg by mouth daily.   meclizine 25 MG tablet Commonly known as: ANTIVERT Take 25 mg by mouth 3 (three) times daily as needed for dizziness.   Melatonin 5 MG Caps Take 5 mg by mouth at bedtime.   multivitamin capsule Take 1 capsule by mouth daily.   PRESERVISION AREDS PO Take 1 tablet by mouth 2 (two) times daily. Areds 2   timolol 0.5 % ophthalmic solution Commonly known as: TIMOPTIC 1 drop daily.   triamcinolone cream 0.1 % Commonly known as: KENALOG Apply 1 application topically 2 (two) times daily as needed (for psoriasis).       Allergies:  Allergies  Allergen Reactions  . Sulfa Antibiotics Anaphylaxis  . Amoxicillin-Pot Clavulanate Diarrhea and Nausea Only    Stomach pains  . Azithromycin Nausea Only and Other (See Comments)    Confusion also  . Trazodone And Nefazodone Other (See Comments)    Disorientation and sleep deprivation  . Mirtazapine Other (See Comments)    Dizzy  . 5-Methoxypsoralen Other (See Comments)    Altered mental  status Used to treat psoriasis and/or vitiligo  . Aspirin Other (See Comments)    EARS START TO RING  . Ceftriaxone Nausea Only  . Diphenhydramine Other (See Comments)    Confusion  . Flexeril [Cyclobenzaprine] Nausea Only  . Levofloxacin Other (See Comments)    Causes pain in calf of leg when walking  Levaquin  . Oxycodone Other (See Comments)    Causes heavy drowsiness    Family History: Family History  Problem Relation Age of Onset  . Kidney disease Neg Hx   . Bladder Cancer Neg Hx   . Breast cancer Neg Hx     Social History:  reports that she has never smoked. She has never used smokeless tobacco. She reports that she does not drink alcohol and does not use drugs.   Physical  Exam: BP 120/64   Pulse 76   Ht 5\' 1"  (1.549 m)   Wt 80 lb (36.3 kg)   BMI 15.12 kg/m   Constitutional:  Alert and oriented, No acute distress.  Accompanied by friend. HEENT: [place] AT, moist mucus membranes.  Trachea midline, no masses. Cardiovascular: No clubbing, cyanosis, or edema. Respiratory: Normal respiratory effort, no increased work of breathing. Skin: No rashes, bruises or suspicious lesions. Neurologic: Grossly intact, no focal deficits, moving all 4 extremities. Psychiatric: Normal mood and affect.  Laboratory Data: Lab Results  Component Value Date   WBC 5.4 04/14/2020   HGB 11.6 (L) 04/14/2020   HCT 35.5 (L) 04/14/2020   MCV 97.3 04/14/2020   PLT 279 04/14/2020    Lab Results  Component Value Date   CREATININE 1.67 (H) 04/14/2020   UA pending- preop  Assessment & Plan:    1. Obstruction of right ureteropelvic junction (UPJ) Chronic metallic stent change annually  Due for next exchange  Plan for preop UA/urine culture, typically chronically colonized and in the past, we have treated with Cipro 250 mg for history of Enterococcus for total of 5 days during 3 days prior to the procedure, will likely do the same pending her preoperative UA today  Discussed risk and  benefits including risk of bleeding, infection, damaging structures amongst others.  She is willing to proceed as planned. - Urinalysis, Complete - CULTURE, URINE COMPREHENSIVE  Hollice Espy, MD  [facility] [address] [place], [place] [address] [phone]

## 2021-03-06 NOTE — Patient Instructions (Signed)
Will call to setup

## 2021-03-09 ENCOUNTER — Other Ambulatory Visit: Payer: Self-pay | Admitting: Urology

## 2021-03-09 DIAGNOSIS — N135 Crossing vessel and stricture of ureter without hydronephrosis: Secondary | ICD-10-CM

## 2021-03-10 LAB — CULTURE, URINE COMPREHENSIVE

## 2021-03-12 ENCOUNTER — Telehealth: Payer: Self-pay | Admitting: *Deleted

## 2021-03-12 MED ORDER — CIPROFLOXACIN HCL 250 MG PO TABS: 250.0000 mg | ORAL_TABLET | Freq: Two times a day (BID) | ORAL | 0 refills | Status: DC

## 2021-03-12 NOTE — Telephone Encounter (Addendum)
Patient guardian informed-sent to [facility]-verbalized understanding of instructions.   ----- Message from Hollice Espy, MD sent at 03/12/2021  1:07 PM EDT ----- As done previously, lets have her start Cipro to 250 mg twice daily starting 3 days before her procedure for a total of 5 days.  Hollice Espy, MD

## 2021-03-13 ENCOUNTER — Telehealth: Payer: Self-pay

## 2021-03-13 NOTE — Telephone Encounter (Signed)
She is not really allergic, please have them fill the script  Hollice Espy, MD

## 2021-03-13 NOTE — Telephone Encounter (Addendum)
Spoke to [place] at [facility] understanding

## 2021-03-13 NOTE — Telephone Encounter (Signed)
Incoming call from [place] @ [facility] in regards to patients RX of Cipro. RX is not due to be filled till June 6 however pharmacist noticed patient has a Levaquin allergy and would like to know an alternative. Please advise.

## 2021-04-04 DIAGNOSIS — R634 Abnormal weight loss: Secondary | ICD-10-CM

## 2021-04-04 HISTORY — DX: Abnormal weight loss: R63.4

## 2021-04-10 ENCOUNTER — Encounter
Admission: RE | Admit: 2021-04-10 | Discharge: 2021-04-10 | Disposition: A | Discharge status: Home / Self Care | Payer: Medicare Other | Source: Ambulatory Visit | Attending: Urology | Admitting: Urology

## 2021-04-10 ENCOUNTER — Other Ambulatory Visit: Payer: Self-pay | Admitting: *Deleted

## 2021-04-10 ENCOUNTER — Other Ambulatory Visit: Payer: Self-pay

## 2021-04-10 ENCOUNTER — Other Ambulatory Visit
Admission: RE | Admit: 2021-04-10 | Discharge: 2021-04-10 | Disposition: A | Discharge status: Home / Self Care | Payer: Medicare Other | Source: Ambulatory Visit | Attending: Urology | Admitting: Urology

## 2021-04-10 ENCOUNTER — Other Ambulatory Visit: Payer: Medicare Other

## 2021-04-10 DIAGNOSIS — N135 Crossing vessel and stricture of ureter without hydronephrosis: Secondary | ICD-10-CM

## 2021-04-10 DIAGNOSIS — Z01818 Encounter for other preprocedural examination: Secondary | ICD-10-CM | POA: Insufficient documentation

## 2021-04-10 DIAGNOSIS — I10 Essential (primary) hypertension: Secondary | ICD-10-CM | POA: Diagnosis not present

## 2021-04-10 DIAGNOSIS — R300 Dysuria: Secondary | ICD-10-CM

## 2021-04-10 HISTORY — DX: Dyspnea, unspecified: R06.00

## 2021-04-10 HISTORY — DX: Depression, unspecified: F32.A

## 2021-04-10 HISTORY — DX: Cardiac arrhythmia, unspecified: I49.9

## 2021-04-10 LAB — MICROSCOPIC EXAMINATION: WBC, UA: >30 /hpf — ABNORMAL HIGH (ref 0–5)

## 2021-04-10 LAB — BASIC METABOLIC PANEL
Anion gap: 6 (ref 5–15)
BUN: 55 mg/dL — ABNORMAL HIGH (ref 8–23)
CO2: 23 mmol/L (ref 22–32)
Calcium: 9.1 mg/dL (ref 8.9–10.3)
Chloride: 108 mmol/L (ref 98–111)
Creatinine, Ser: 1.99 mg/dL — ABNORMAL HIGH (ref 0.44–1.00)
GFR, Estimated: 23 mL/min — ABNORMAL LOW (ref >60)
Glucose, Bld: 101 mg/dL — ABNORMAL HIGH (ref 70–99)
Potassium: 4.5 mmol/L (ref 3.5–5.1)
Sodium: 137 mmol/L (ref 135–145)

## 2021-04-10 LAB — URINALYSIS, COMPLETE
Bilirubin, UA: NEGATIVE
Glucose, UA: NEGATIVE
Ketones, UA: NEGATIVE
Nitrite, UA: POSITIVE — AB
Specific Gravity, UA: 1.01 (ref 1.005–1.030)
Urobilinogen, Ur: 0.2 mg/dL (ref 0.2–1.0)
pH, UA: 5.5 (ref 5.0–7.5)

## 2021-04-10 LAB — CBC
HCT: 29.5 % — ABNORMAL LOW (ref 36.0–46.0)
Hemoglobin: 9.7 g/dL — ABNORMAL LOW (ref 12.0–15.0)
MCH: 32.3 pg (ref 26.0–34.0)
MCHC: 32.9 g/dL (ref 30.0–36.0)
MCV: 98.3 fL (ref 80.0–100.0)
Platelets: 268 10*3/uL (ref 150–400)
RBC: 3 MIL/uL — ABNORMAL LOW (ref 3.87–5.11)
RDW: 13.6 % (ref 11.5–15.5)
WBC: 5.4 10*3/uL (ref 4.0–10.5)
nRBC: 0 % (ref 0.0–0.2)

## 2021-04-10 NOTE — Patient Instructions (Addendum)
INSTRUCTIONS FOR SURGERY     Your surgery is scheduled for:   Monday, June 13TH     To find out your arrival time for the day of surgery,          please call [phone] between 1 pm and 3 pm on :  Friday, June 10TH     When you arrive for surgery, report to the [facility]. ONCE THEY HAVE COMPLETED THEIR PROCESS, PLEASE PROCEED TO THE  SECOND FLOOR AND SIGN IN AT THE SURGERY DESK.    REMEMBER: Instructions that are not followed completely may result in serious medical risk,  up to and including death, or upon the discretion of your surgeon and anesthesiologist,            your surgery may need to be rescheduled.  __X__ 1. Do not eat food after midnight the night before your procedure.                    No gum, candy, lozenger, tic tacs, tums or hard candies.                  ABSOLUTELY NOTHING SOLID IN YOUR MOUTH AFTER MIDNIGHT                   You may drink unlimited clear liquids up to 2 hours before you are scheduled to arrive for surgery.                   Do not drink anything within those 2 hours unless you need to take medicine, then take the                   smallest amount you need.  Clear liquids include:  water, apple juice without pulp,                   any flavor Gatorade, Black coffee, black tea.  Sugar may be added but no dairy/ honey /lemon.                        Broth and jello is not considered a clear liquid.  __x__  2. On the morning of surgery, please brush your teeth with toothpaste and water. You may rinse with                  mouthwash if you wish but DO NOT SWALLOW TOOTHPASTE OR MOUTHWASH  __X___3. NO alcohol for 24 hours before or after surgery.  __x___ 4.  Do NOT smoke or use e-cigarettes for 24 HOURS PRIOR TO SURGERY.                      DO NOT Use any chewable tobacco products for at least 6 hours prior to surgery.  __x___ 5. If you start any new  medication after this appointment and prior to surgery, please                   Bring it with you on the day of surgery.  ___x__ 6. Notify  your doctor if there is any change in your medical condition, such as fever,                  infection, vomitting, diarrhea or any open sores.  __x___ 7.  USE ANTIBIOTIC SOAP as instructed, the night before surgery and the day of surgery.                   Once you have washed with this soap, do NOT use any of the following: Powders, perfumes                    or lotions. Please do not wear make up, hairpins, clips or nail polish. You may wear deodorant.                                                    Women need to shave 48 hours prior to surgery.                   DO NOT wear ANY jewelry on the day of surgery. If there are rings that are too tight to                    remove easily, please address this prior to the surgery day. Piercings need to be removed.                                                                     NO METAL ON YOUR BODY.                    Do NOT bring any valuables.  If you came to Pre-Admit testing then you will not need license,                     insurance card or credit card.  If you will be staying overnight, please either leave your things in                     the car or have your family be responsible for these items.                     [facility] IS NOT RESPONSIBLE FOR BELONGINGS OR VALUABLES.  ___X__ 8. DO NOT wear contact lenses on surgery day.  You may not have dentures,                     Hearing aides, contacts or glasses in the operating room. These items can be                    Placed in the Recovery Room to receive immediately after surgery.  __x___ 9. IF YOU ARE SCHEDULED TO GO HOME ON THE SAME DAY, YOU MUST                   Have someone to drive you home and to stay with you  for the first 24 hours.  Have an arrangement prior to arriving on surgery day.  ___x__ 10. Take the  following medications on the morning of surgery with a sip of water:                              1. CELEXA                     2. BUSPAR                     3. CIPRO                     4. TIMOPTIC EYE DROPS                     5.                   __X__  12. STOP ALL ASPIRIN PRODUCTS ONE WEEK PRIOR TO SURGERY.                       THIS INCLUDES BC POWDERS / GOODIES POWDER  __x___ 13. STOP Anti-inflammatories as of ONE WEEK PRIOR TO SURGERY.                      This includes IBUPROFEN / MOTRIN / ADVIL / ALEVE/ NAPROXYN                    YOU MAY TAKE TYLENOL ANY TIME PRIOR TO SURGERY.  __X___ 27.  Stop supplements until after surgery.                     This includes: MULTIVITAMINS // PRESERVISION AREDS  ___x___17.  Continue to take the following medications but do not take on the morning of surgery:                           LOSARTAN //KENALOG CREAM // ESTRADIOL CREAM  __X____18.  Wear clean and comfortable clothing to the hospital.  PLEASE BRING PHONE NUMBERS OF [facility].  [place].  WE WILL SAFELY SECURE THEM WHEN YOU    ARE IN THE OPERATING ROOM.

## 2021-04-13 ENCOUNTER — Telehealth: Payer: Self-pay | Admitting: *Deleted

## 2021-04-13 LAB — CULTURE, URINE COMPREHENSIVE

## 2021-04-13 NOTE — Telephone Encounter (Signed)
-----   Message from Hollice Espy, MD sent at 04/13/2021 12:33 PM EDT ----- Please treat with cipro 250 mg bid x 7 days starting 5 days before surgery.  Aware and ok with levaquin allergy.  Hollice Espy, MD

## 2021-04-13 NOTE — Telephone Encounter (Addendum)
Patient aware  ----- Message from Hollice Espy, MD sent at 04/13/2021 12:33 PM EDT ----- Please treat with cipro 250 mg bid x 7 days starting 5 days before surgery.  Aware and ok with levaquin allergy.  Hollice Espy, MD

## 2021-04-15 MED ORDER — ORAL CARE MOUTH RINSE: 15.0000 mL | Freq: Once | OROMUCOSAL | Status: AC

## 2021-04-15 MED ORDER — CEFAZOLIN SODIUM-DEXTROSE 2-4 GM/100ML-% IV SOLN
2.0000 g | INTRAVENOUS | Status: AC
Start: 2021-04-15 — End: 2021-04-16
  Administered 2021-04-16: 2 g via INTRAVENOUS

## 2021-04-15 MED ORDER — FAMOTIDINE 20 MG PO TABS
20.0000 mg | ORAL_TABLET | Freq: Once | ORAL | Status: AC
  Administered 2021-04-16: 20 mg via ORAL

## 2021-04-15 MED ORDER — CHLORHEXIDINE GLUCONATE 0.12 % MT SOLN
15.0000 mL | Freq: Once | OROMUCOSAL | Status: AC
Start: 2021-04-15 — End: 2021-04-16
  Administered 2021-04-16: 15 mL via OROMUCOSAL

## 2021-04-15 MED ORDER — LACTATED RINGERS IV SOLN: INTRAVENOUS | Status: DC

## 2021-04-16 ENCOUNTER — Encounter
Admission: RE | Disposition: A | Discharge status: Home / Self Care | Payer: Self-pay | Source: Home / Self Care | Attending: Urology

## 2021-04-16 ENCOUNTER — Ambulatory Visit
Admission: RE | Admit: 2021-04-16 | Discharge: 2021-04-16 | Disposition: A | Discharge status: Home / Self Care | Payer: Medicare Other | Attending: Urology | Admitting: Urology

## 2021-04-16 ENCOUNTER — Encounter: Payer: Self-pay | Admitting: Urology

## 2021-04-16 ENCOUNTER — Other Ambulatory Visit: Payer: Self-pay

## 2021-04-16 ENCOUNTER — Ambulatory Visit: Payer: Medicare Other | Admitting: Certified Registered Nurse Anesthetist

## 2021-04-16 ENCOUNTER — Ambulatory Visit: Payer: Medicare Other | Admitting: Urgent Care

## 2021-04-16 ENCOUNTER — Ambulatory Visit: Payer: Medicare Other

## 2021-04-16 DIAGNOSIS — Z79899 Other long term (current) drug therapy: Secondary | ICD-10-CM | POA: Diagnosis not present

## 2021-04-16 DIAGNOSIS — Z881 Allergy status to other antibiotic agents status: Secondary | ICD-10-CM | POA: Diagnosis not present

## 2021-04-16 DIAGNOSIS — Z885 Allergy status to narcotic agent status: Secondary | ICD-10-CM | POA: Insufficient documentation

## 2021-04-16 DIAGNOSIS — Z87442 Personal history of urinary calculi: Secondary | ICD-10-CM | POA: Diagnosis not present

## 2021-04-16 DIAGNOSIS — I129 Hypertensive chronic kidney disease with stage 1 through stage 4 chronic kidney disease, or unspecified chronic kidney disease: Secondary | ICD-10-CM | POA: Insufficient documentation

## 2021-04-16 DIAGNOSIS — Z882 Allergy status to sulfonamides status: Secondary | ICD-10-CM | POA: Diagnosis not present

## 2021-04-16 DIAGNOSIS — N131 Hydronephrosis with ureteral stricture, not elsewhere classified: Secondary | ICD-10-CM | POA: Diagnosis not present

## 2021-04-16 DIAGNOSIS — Z880 Allergy status to penicillin: Secondary | ICD-10-CM | POA: Diagnosis not present

## 2021-04-16 DIAGNOSIS — Z886 Allergy status to analgesic agent status: Secondary | ICD-10-CM | POA: Diagnosis not present

## 2021-04-16 DIAGNOSIS — N135 Crossing vessel and stricture of ureter without hydronephrosis: Secondary | ICD-10-CM | POA: Diagnosis not present

## 2021-04-16 DIAGNOSIS — N184 Chronic kidney disease, stage 4 (severe): Secondary | ICD-10-CM | POA: Diagnosis not present

## 2021-04-16 DIAGNOSIS — Z960 Presence of urogenital implants: Secondary | ICD-10-CM | POA: Diagnosis not present

## 2021-04-16 HISTORY — PX: CYSTOSCOPY W/ RETROGRADES: SHX1426

## 2021-04-16 HISTORY — PX: CYSTOSCOPY W/ URETERAL STENT PLACEMENT: SHX1429

## 2021-04-16 SURGERY — CYSTOSCOPY, FLEXIBLE, WITH STENT REPLACEMENT
Anesthesia: General | Laterality: Right

## 2021-04-16 MED ORDER — CHLORHEXIDINE GLUCONATE 0.12 % MT SOLN
OROMUCOSAL | Status: AC
  Filled 2021-04-16: qty 15

## 2021-04-16 MED ORDER — CEFAZOLIN SODIUM-DEXTROSE 2-4 GM/100ML-% IV SOLN
INTRAVENOUS | Status: AC
  Filled 2021-04-16: qty 100

## 2021-04-16 MED ORDER — ACETAMINOPHEN 10 MG/ML IV SOLN
INTRAVENOUS | Status: AC
  Filled 2021-04-16: qty 100

## 2021-04-16 MED ORDER — IOHEXOL 180 MG/ML  SOLN
INTRAMUSCULAR | Status: DC | PRN
  Administered 2021-04-16: 5 mL

## 2021-04-16 MED ORDER — PROPOFOL 10 MG/ML IV BOLUS
INTRAVENOUS | Status: DC | PRN
  Administered 2021-04-16: 20 mg via INTRAVENOUS

## 2021-04-16 MED ORDER — FAMOTIDINE 20 MG PO TABS
ORAL_TABLET | ORAL | Status: AC
  Filled 2021-04-16: qty 1

## 2021-04-16 MED ORDER — PROPOFOL 500 MG/50ML IV EMUL
INTRAVENOUS | Status: DC | PRN
  Administered 2021-04-16: 50 ug/kg/min via INTRAVENOUS

## 2021-04-16 MED ORDER — PROPOFOL 10 MG/ML IV BOLUS
INTRAVENOUS | Status: AC
  Filled 2021-04-16: qty 80

## 2021-04-16 MED ORDER — FENTANYL CITRATE (PF) 100 MCG/2ML IJ SOLN
INTRAMUSCULAR | Status: AC
  Filled 2021-04-16: qty 2

## 2021-04-16 MED ORDER — LIDOCAINE HCL (CARDIAC) PF 100 MG/5ML IV SOSY
PREFILLED_SYRINGE | INTRAVENOUS | Status: DC | PRN
  Administered 2021-04-16: 50 mg via INTRAVENOUS

## 2021-04-16 MED ORDER — FENTANYL CITRATE (PF) 100 MCG/2ML IJ SOLN: 25.0000 ug | INTRAMUSCULAR | Status: DC | PRN

## 2021-04-16 SURGICAL SUPPLY — 15 items
BAG DRAIN CYSTO-URO LG1000N (MISCELLANEOUS) ×3 IMPLANT
BRUSH SCRUB EZ 1% IODOPHOR (MISCELLANEOUS) ×3 IMPLANT
CATH URETL 5X70 OPEN END (CATHETERS) ×3 IMPLANT
DRAPE UTILITY 15X26 TOWEL STRL (DRAPES) ×3 IMPLANT
GLOVE SURG ENC MOIS LTX SZ6.5 (GLOVE) ×3 IMPLANT
GOWN STRL REUS W/ TWL LRG LVL3 (GOWN DISPOSABLE) ×2 IMPLANT
GOWN STRL REUS W/TWL LRG LVL3 (GOWN DISPOSABLE) ×6
GUIDEWIRE STR DUAL SENSOR (WIRE) ×3 IMPLANT
IV NS IRRIG 3000ML ARTHROMATIC (IV SOLUTION) ×3 IMPLANT
KIT TURNOVER CYSTO (KITS) ×3 IMPLANT
PACK CYSTO AR (MISCELLANEOUS) ×3 IMPLANT
Resonance Metallic Ureteral Stent and Introducer 6 ×3 IMPLANT
SET CYSTO W/LG BORE CLAMP LF (SET/KITS/TRAYS/PACK) ×3 IMPLANT
SURGILUBE 2OZ TUBE FLIPTOP (MISCELLANEOUS) ×3 IMPLANT
WATER STERILE IRR 1000ML POUR (IV SOLUTION) ×3 IMPLANT

## 2021-04-16 NOTE — Discharge Instructions (Addendum)
You have a ureteral stent in place.  This is a tube that extends from your kidney to your bladder.  This may cause urinary bleeding, burning with urination, and urinary frequency.  Please call our office or present to the ED if you develop fevers >101 or pain which is not able to be controlled with oral pain medications.  You may be given either Flomax and/ or ditropan to help with bladder spasms and stent pain in addition to pain medications.    [facility] [address], [address] [place], [place] [address] [phone]  AMBULATORY SURGERY  DISCHARGE INSTRUCTIONS   The drugs that you were given will stay in your system until tomorrow so for the next 24 hours you should not:  Drive an automobile Make any legal decisions Drink any alcoholic beverage   You may resume regular meals tomorrow.  Today it is better to start with liquids and gradually work up to solid foods.  You may eat anything you prefer, but it is better to start with liquids, then soup and crackers, and gradually work up to solid foods.   Please notify your doctor immediately if you have any unusual bleeding, trouble breathing, redness and pain at the surgery site, drainage, fever, or pain not relieved by medication.    Additional Instructions:   Please contact your physician with any problems or Same Day Surgery at [phone], Monday through Friday 6 am to 4 pm, or [facility] Main number at [phone].  

## 2021-04-16 NOTE — H&P (Signed)
Collapse All   04/16/21 H&P updated without changes RRR CTAB  + preop urine culture, chronically colonized Treated periprocedurally as documented   CHOLE DRIVER [birth year] [id number]   Referring provider: [name], MD [address] [facility] [place],  [place] [address]      Chief Complaint  Patient presents with   Follow-up      HPI: 87 year old female with a personal history of congenital right UPJ obstruction and a chronic indwelling metallic stent who returns today to discuss her next exchange.   Her last exchange was 04/24/2020.   She did have a postprocedural urinary tract infection after her last stent exchange.  Has not had any other infections since then.   No significant urinary symptoms.   She reports that she is doing well.  She has slightly decreased energy but otherwise no changes in her medical history.  She is having a hard time keeping her weight up.  She has a good appetite.     PMH:     Past Medical History:  Diagnosis Date   Acute cystitis     Anxiety     Cancer ([place])      skin cancer   Chronic UTI     Glaucoma     History of kidney stones     Hydronephrosis      stage IV ckd   Hypertension     Incomplete bladder emptying     Macular degeneration     PONV (postoperative nausea and vomiting)      nausea only   Renal cyst     SUI (stress urinary incontinence, female)     Unsteady gait     UPJ obstruction, congenital     Vertigo        Surgical History:      Past Surgical History:  Procedure Laterality Date   ABDOMINAL HYSTERECTOMY       cysto stent exchange        once a year for last 6 years. metal stent   CYSTOSCOPY W/ RETROGRADES Right 02/16/2018    Procedure: CYSTOSCOPY WITH RETROGRADE PYELOGRAM;  Surgeon: Hollice Espy, MD;  Location: [facility];  Service: Urology;  Laterality: Right;   CYSTOSCOPY W/ RETROGRADES Right 04/24/2020    Procedure: CYSTOSCOPY WITH RETROGRADE PYELOGRAM;  Surgeon: Hollice Espy, MD;   Location: [facility];  Service: Urology;  Laterality: Right;   CYSTOSCOPY W/ URETERAL STENT PLACEMENT Right 01/01/2016    Procedure: CYSTOSCOPY WITH STENT REPLACEMENT;  Surgeon: Hollice Espy, MD;  Location: [facility];  Service: Urology;  Laterality: Right;   CYSTOSCOPY W/ URETERAL STENT PLACEMENT Right 02/18/2017    Procedure: CYSTOSCOPY WITH STENT REPLACEMENT;  Surgeon: Hollice Espy, MD;  Location: [facility];  Service: Urology;  Laterality: Right;   CYSTOSCOPY W/ URETERAL STENT PLACEMENT Right 02/16/2018    Procedure: CYSTOSCOPY WITH STENT REPLACEMENT;  Surgeon: Hollice Espy, MD;  Location: [facility];  Service: Urology;  Laterality: Right;   CYSTOSCOPY W/ URETERAL STENT PLACEMENT Right 06/07/2019    Procedure: CYSTOSCOPY WITH STENT REPLACEMENT;  Surgeon: Hollice Espy, MD;  Location: [facility];  Service: Urology;  Laterality: Right;   CYSTOSCOPY W/ URETERAL STENT PLACEMENT Right 04/24/2020    Procedure: CYSTOSCOPY WITH STENT REPLACEMENT;  Surgeon: Hollice Espy, MD;  Location: [facility];  Service: Urology;  Laterality: Right;  metal stent   EYE SURGERY Bilateral      cataract extractions   HERNIA REPAIR Left 05/2008    inguinal hernia  Home Medications:       Allergies as of 03/06/2021       Reactions    Sulfa Antibiotics Anaphylaxis    Amoxicillin-pot Clavulanate Diarrhea, Nausea Only    Stomach pains    Azithromycin Nausea Only, Other (See Comments)    Confusion also    Trazodone And Nefazodone Other (See Comments)    Disorientation and sleep deprivation    Mirtazapine Other (See Comments)    Dizzy    5-methoxypsoralen Other (See Comments)    Altered mental status Used to treat psoriasis and/or vitiligo    Aspirin Other (See Comments)    EARS START TO RING    Ceftriaxone Nausea Only    Diphenhydramine Other (See Comments)    Confusion    Flexeril [cyclobenzaprine] Nausea Only    Levofloxacin Other (See Comments)    Causes pain in calf of leg when walking Levaquin     Oxycodone Other (See Comments)    Causes heavy drowsiness           Medication List         Accurate as of Mar 06, 2021 11:03 AM. If you have any questions, ask your nurse or doctor.          acetaminophen 325 MG tablet Commonly known as: TYLENOL Take 325-650 mg by mouth every 6 (six) hours as needed for moderate pain.    amLODipine 10 MG tablet Commonly known as: NORVASC Take 10 mg by mouth at bedtime.    citalopram 10 MG tablet Commonly known as: CELEXA Take 5 mg by mouth daily.    estradiol 0.1 MG/GM vaginal cream Commonly known as: ESTRACE VAGINAL Apply 0.5mg  (pea-sized amount)  just inside the vaginal introitus with a finger-tip every night for two weeks and then Monday, Wednesday and Friday nights. What changed: how much to take how to take this when to take this additional instructions    Fish Oil 1000 MG Caps Take 1,000 mg by mouth daily.    latanoprost 0.005 % ophthalmic solution Commonly known as: XALATAN Place 1 drop into both eyes at bedtime.    LORazepam 0.5 MG tablet Commonly known as: ATIVAN Take 0.5 mg by mouth at bedtime.    losartan 25 MG tablet Commonly known as: COZAAR Take 25 mg by mouth daily.    meclizine 25 MG tablet Commonly known as: ANTIVERT Take 25 mg by mouth 3 (three) times daily as needed for dizziness.    Melatonin 5 MG Caps Take 5 mg by mouth at bedtime.    multivitamin capsule Take 1 capsule by mouth daily.    PRESERVISION AREDS PO Take 1 tablet by mouth 2 (two) times daily. Areds 2    timolol 0.5 % ophthalmic solution Commonly known as: TIMOPTIC 1 drop daily.    triamcinolone cream 0.1 % Commonly known as: KENALOG Apply 1 application topically 2 (two) times daily as needed (for psoriasis).           Allergies:       Allergies  Allergen Reactions   Sulfa Antibiotics Anaphylaxis   Amoxicillin-Pot Clavulanate Diarrhea and Nausea Only      Stomach pains   Azithromycin Nausea Only and Other (See Comments)       Confusion also   Trazodone And Nefazodone Other (See Comments)      Disorientation and sleep deprivation   Mirtazapine Other (See Comments)      Dizzy   5-Methoxypsoralen Other (See Comments)      Altered mental  status Used to treat psoriasis and/or vitiligo   Aspirin Other (See Comments)      EARS START TO RING   Ceftriaxone Nausea Only   Diphenhydramine Other (See Comments)      Confusion   Flexeril [Cyclobenzaprine] Nausea Only   Levofloxacin Other (See Comments)      Causes pain in calf of leg when walking   Levaquin   Oxycodone Other (See Comments)      Causes heavy drowsiness      Family History:      Family History  Problem Relation Age of Onset   Kidney disease Neg Hx     Bladder Cancer Neg Hx     Breast cancer Neg Hx        Social History:  reports that she has never smoked. She has never used smokeless tobacco. She reports that she does not drink alcohol and does not use drugs.     Physical Exam: BP 120/64   Pulse 76   Ht 5\' 1"  (1.549 m)   Wt 80 lb (36.3 kg)   BMI 15.12 kg/m   Constitutional:  Alert and oriented, No acute distress.  Accompanied by friend. HEENT: [place] AT, moist mucus membranes.  Trachea midline, no masses. Cardiovascular: No clubbing, cyanosis, or edema. Respiratory: Normal respiratory effort, no increased work of breathing. Skin: No rashes, bruises or suspicious lesions. Neurologic: Grossly intact, no focal deficits, moving all 4 extremities. Psychiatric: Normal mood and affect.   Laboratory Data: Recent Labs       Lab Results  Component Value Date    WBC 5.4 04/14/2020    HGB 11.6 (L) 04/14/2020    HCT 35.5 (L) 04/14/2020    MCV 97.3 04/14/2020    PLT 279 04/14/2020        Recent Labs       Lab Results  Component Value Date    CREATININE 1.67 (H) 04/14/2020      UA pending- preop   Assessment & Plan:     1. Obstruction of right ureteropelvic junction (UPJ) Chronic metallic stent change annually   Due for next  exchange   Plan for preop UA/urine culture, typically chronically colonized and in the past, we have treated with Cipro 250 mg for history of Enterococcus for total of 5 days during 3 days prior to the procedure, will likely do the same pending her preoperative UA today   Discussed risk and benefits including risk of bleeding, infection, damaging structures amongst others.  She is willing to proceed as planned. - Urinalysis, Complete - CULTURE, URINE COMPREHENSIVE   Hollice Espy, MD   [facility] [address] [place], [place] [address] [phone]

## 2021-04-16 NOTE — Anesthesia Preprocedure Evaluation (Signed)
Anesthesia Evaluation  Patient identified by MRN, date of birth, ID band Patient awake    Reviewed: Allergy & Precautions, H&P , NPO status , Patient's Chart, lab work & pertinent test results  History of Anesthesia Complications (+) PONV and history of anesthetic complications  Airway Mallampati: III   Neck ROM: limited    Dental  (+) Chipped, Poor Dentition   Pulmonary neg pulmonary ROS, neg shortness of breath, neg COPD,           Cardiovascular Exercise Tolerance: Good hypertension, Pt. on medications (-) angina(-) Past MI, (-) Cardiac Stents and (-) DOE + dysrhythmias (-) Valvular Problems/Murmurs     Neuro/Psych PSYCHIATRIC DISORDERS Anxiety Depression negative neurological ROS     GI/Hepatic negative GI ROS, Neg liver ROS,   Endo/Other  negative endocrine ROS  Renal/GU CRFRenal disease     Musculoskeletal   Abdominal   Peds  Hematology negative hematology ROS (+)   Anesthesia Other Findings  Past Medical History: No date: Acute cystitis No date: Anxiety No date: Cancer ([place])     Comment:  skin cancer No date: Chronic UTI No date: Depression No date: Dyspnea No date: Dysrhythmia     Comment:  tachy arrythmia No date: Glaucoma No date: History of kidney stones No date: Hydronephrosis     Comment:  stage IV ckd No date: Hypertension No date: Incomplete bladder emptying No date: Macular degeneration No date: PONV (postoperative nausea and vomiting)     Comment:  nausea only No date: Renal cyst No date: SUI (stress urinary incontinence, female) No date: Unsteady gait No date: UPJ obstruction, congenital No date: Vertigo 04/2021: Weight loss, unintentional     Comment:  having difficulty keeping weight on   Reproductive/Obstetrics negative OB ROS                             Anesthesia Physical  Anesthesia Plan  ASA: 3  Anesthesia Plan: General   Post-op Pain  Management:    Induction: Intravenous  PONV Risk Score and Plan: Treatment may vary due to age or medical condition, TIVA and Propofol infusion  Airway Management Planned: Natural Airway and Simple Face Mask  Additional Equipment:   Intra-op Plan:   Post-operative Plan:   Informed Consent: I have reviewed the patients History and Physical, chart, labs and discussed the procedure including the risks, benefits and alternatives for the proposed anesthesia with the patient or authorized representative who has indicated his/her understanding and acceptance.       Plan Discussed with:   Anesthesia Plan Comments: (Patient consented for risks of anesthesia including but not limited to:  - adverse reactions to medications - damage to teeth, lips or other oral mucosa - sore throat or hoarseness - Damage to heart, brain, lungs or loss of life  Patient voiced understanding.)        Anesthesia Quick Evaluation

## 2021-04-16 NOTE — Op Note (Signed)
04/16/21   Preoperative diagnosis: Right UPJ obstruction Chronic right indwelling ureteral stent    Postoperative diagnosis: Same as above    Procedure: Cystoscopy Right retrograde pyelogram Right ureteral stent exchange with metallic stent   Surgeon: Hollice Espy, MD   Anesthesia: General   Complications: None   Intraoperative findings: Right ureteral stent exchange without difficulty today.   EBL: Normal   Specimens: None   Drains: 6 x 22 French double-J metallic right ureteral stent   Indication: April Green is a 87 y.o. 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  Her stent was last changed approximately one year ago.  After reviewing the management options for treatment, she elected to proceed with the above surgical procedure(s). We have discussed the potential benefits and risks of the procedure, side effects of the proposed treatment, the likelihood of the patient achieving the goals of the procedure, and any potential problems that might occur during the procedure or recuperation. Informed consent has been obtained.   Description of procedure:   The patient was taken to the operating room and general anesthesia was induced.  The patient was placed in the dorsal lithotomy position, prepped and draped in the usual sterile fashion, and preoperative antibiotics were administered. A preoperative time-out was performed.   A 21 French rigid cystoscopy was advanced per urethra into the bladder. Attention was turned to the right ureteral orifice from which a indwelling metal ureteral stent was identified. There is no encrustation on the stent. The remainder of the bladder appeared relatively normal. The distal coil of the stent was grasped using stent graspers and it easily removed under fluoroscopic guidance.  The open-ended ureteral catheter was placed just within the UO and a gentle retrograde pyelogram was performed which showed a  decompressed ureter and upper tract collecting system.  There is no extravasation noted.  A sensor wire was then placed up to level of the kidney without difficulty. The sheath with introducer from the metal stent was advanced up to level of the renal pelvis using the distal marker to assess its position. The inner lumen was then removed. The metal stent was then advanced through the introducer sheath up to level of the renal pelvis. A pressure was then used to create a coil within the renal pelvis and a Seldinger technique was then used to remove the sheath leaving the stent in place. A full coil was noted within the bladder once the introducer sheath was completely removed. The scope was reintroduced into the bladder to ensure adequate position of the stent and distal coil which appeared to be a full coil in satisfactory position. The bladder was then drained and the scope was removed. She was repositioned the supine position, reversed from anesthesia, and taken to the PACU in stable condition.   Plan: Patient will follow up in 11 months to arrange for her next stent exchange in one year.

## 2021-04-16 NOTE — Transfer of Care (Signed)
Immediate Anesthesia Transfer of Care Note  Patient: April Green  Procedure(s) Performed: CYSTOSCOPY WITH STENT REPLACEMENT (Right) CYSTOSCOPY WITH RETROGRADE PYELOGRAM (Right)  Patient Location: PACU  Anesthesia Type:General  Level of Consciousness: awake, drowsy and patient cooperative  Airway & Oxygen Therapy: Patient Spontanous Breathing  Post-op Assessment: Report given to RN and Post -op Vital signs reviewed and stable  Post vital signs: Reviewed and stable  Last Vitals:  Vitals Value Taken Time  BP 132/58 04/16/21 1145  Temp    Pulse 58 04/16/21 1148  Resp 30 04/16/21 1148  SpO2 100 % 04/16/21 1148  Vitals shown include unvalidated device data.  Last Pain:  Vitals:   04/16/21 0944  TempSrc: Oral  PainSc: 0-No pain     Temp 94.4    Complications: No notable events documented.

## 2021-04-18 NOTE — Anesthesia Postprocedure Evaluation (Signed)
Anesthesia Post Note  Patient: KIYANNA BIEGLER  Procedure(s) Performed: CYSTOSCOPY WITH STENT REPLACEMENT (Right) CYSTOSCOPY WITH RETROGRADE PYELOGRAM (Right)  Patient location during evaluation: PACU Anesthesia Type: General Level of consciousness: awake and alert Pain management: pain level controlled Vital Signs Assessment: post-procedure vital signs reviewed and stable Respiratory status: spontaneous breathing, nonlabored ventilation, respiratory function stable and patient connected to nasal cannula oxygen Cardiovascular status: blood pressure returned to baseline and stable Postop Assessment: no apparent nausea or vomiting Anesthetic complications: no   No notable events documented.   Last Vitals:  Vitals:   04/16/21 1208 04/16/21 1226  BP: 126/61 (!) 92/41  Pulse: 61 65  Resp: 13 14  Temp: 36.6 C 36.4 C  SpO2: 99% 100%    Last Pain:  Vitals:   04/17/21 1024  TempSrc:   PainSc: 0-No pain                 Martha Clan

## 2021-10-03 ENCOUNTER — Other Ambulatory Visit: Payer: Self-pay

## 2021-10-03 ENCOUNTER — Emergency Department: Payer: Medicare Other

## 2021-10-03 ENCOUNTER — Inpatient Hospital Stay
Admission: EM | Admit: 2021-10-03 | Discharge: 2021-10-06 | DRG: 871 | Disposition: A | Discharge status: Home Health | Payer: Medicare Other | Attending: Student | Admitting: Student

## 2021-10-03 DIAGNOSIS — E43 Unspecified severe protein-calorie malnutrition: Secondary | ICD-10-CM | POA: Insufficient documentation

## 2021-10-03 DIAGNOSIS — R6520 Severe sepsis without septic shock: Secondary | ICD-10-CM

## 2021-10-03 DIAGNOSIS — Z87891 Personal history of nicotine dependence: Secondary | ICD-10-CM

## 2021-10-03 DIAGNOSIS — Z79899 Other long term (current) drug therapy: Secondary | ICD-10-CM

## 2021-10-03 DIAGNOSIS — N179 Acute kidney failure, unspecified: Principal | ICD-10-CM | POA: Diagnosis present

## 2021-10-03 DIAGNOSIS — R64 Cachexia: Secondary | ICD-10-CM | POA: Diagnosis present

## 2021-10-03 DIAGNOSIS — N390 Urinary tract infection, site not specified: Secondary | ICD-10-CM | POA: Diagnosis not present

## 2021-10-03 DIAGNOSIS — D72829 Elevated white blood cell count, unspecified: Secondary | ICD-10-CM | POA: Diagnosis not present

## 2021-10-03 DIAGNOSIS — D631 Anemia in chronic kidney disease: Secondary | ICD-10-CM | POA: Diagnosis present

## 2021-10-03 DIAGNOSIS — I10 Essential (primary) hypertension: Secondary | ICD-10-CM

## 2021-10-03 DIAGNOSIS — N393 Stress incontinence (female) (male): Secondary | ICD-10-CM | POA: Diagnosis present

## 2021-10-03 DIAGNOSIS — I959 Hypotension, unspecified: Secondary | ICD-10-CM | POA: Diagnosis present

## 2021-10-03 DIAGNOSIS — Q6239 Other obstructive defects of renal pelvis and ureter: Secondary | ICD-10-CM

## 2021-10-03 DIAGNOSIS — Z885 Allergy status to narcotic agent status: Secondary | ICD-10-CM

## 2021-10-03 DIAGNOSIS — Z90710 Acquired absence of both cervix and uterus: Secondary | ICD-10-CM

## 2021-10-03 DIAGNOSIS — Z85828 Personal history of other malignant neoplasm of skin: Secondary | ICD-10-CM

## 2021-10-03 DIAGNOSIS — Z960 Presence of urogenital implants: Secondary | ICD-10-CM | POA: Diagnosis present

## 2021-10-03 DIAGNOSIS — Z20822 Contact with and (suspected) exposure to covid-19: Secondary | ICD-10-CM | POA: Diagnosis present

## 2021-10-03 DIAGNOSIS — Z681 Body mass index (BMI) 19 or less, adult: Secondary | ICD-10-CM

## 2021-10-03 DIAGNOSIS — F419 Anxiety disorder, unspecified: Secondary | ICD-10-CM | POA: Diagnosis present

## 2021-10-03 DIAGNOSIS — A4151 Sepsis due to Escherichia coli [E. coli]: Principal | ICD-10-CM | POA: Diagnosis present

## 2021-10-03 DIAGNOSIS — Z888 Allergy status to other drugs, medicaments and biological substances status: Secondary | ICD-10-CM

## 2021-10-03 DIAGNOSIS — Z881 Allergy status to other antibiotic agents status: Secondary | ICD-10-CM

## 2021-10-03 DIAGNOSIS — H409 Unspecified glaucoma: Secondary | ICD-10-CM | POA: Diagnosis present

## 2021-10-03 DIAGNOSIS — N184 Chronic kidney disease, stage 4 (severe): Secondary | ICD-10-CM | POA: Diagnosis present

## 2021-10-03 DIAGNOSIS — F32A Depression, unspecified: Secondary | ICD-10-CM | POA: Diagnosis present

## 2021-10-03 DIAGNOSIS — A419 Sepsis, unspecified organism: Secondary | ICD-10-CM | POA: Diagnosis not present

## 2021-10-03 DIAGNOSIS — Z886 Allergy status to analgesic agent status: Secondary | ICD-10-CM

## 2021-10-03 DIAGNOSIS — W19XXXA Unspecified fall, initial encounter: Secondary | ICD-10-CM

## 2021-10-03 DIAGNOSIS — W1830XA Fall on same level, unspecified, initial encounter: Secondary | ICD-10-CM | POA: Diagnosis present

## 2021-10-03 DIAGNOSIS — Z882 Allergy status to sulfonamides status: Secondary | ICD-10-CM

## 2021-10-03 DIAGNOSIS — H9193 Unspecified hearing loss, bilateral: Secondary | ICD-10-CM | POA: Diagnosis present

## 2021-10-03 DIAGNOSIS — T796XXA Traumatic ischemia of muscle, initial encounter: Secondary | ICD-10-CM | POA: Diagnosis present

## 2021-10-03 DIAGNOSIS — Z87440 Personal history of urinary (tract) infections: Secondary | ICD-10-CM

## 2021-10-03 DIAGNOSIS — I700 Atherosclerosis of aorta: Secondary | ICD-10-CM | POA: Diagnosis present

## 2021-10-03 DIAGNOSIS — G4700 Insomnia, unspecified: Secondary | ICD-10-CM | POA: Diagnosis present

## 2021-10-03 DIAGNOSIS — Z66 Do not resuscitate: Secondary | ICD-10-CM | POA: Diagnosis present

## 2021-10-03 DIAGNOSIS — I129 Hypertensive chronic kidney disease with stage 1 through stage 4 chronic kidney disease, or unspecified chronic kidney disease: Secondary | ICD-10-CM | POA: Diagnosis present

## 2021-10-03 DIAGNOSIS — Z793 Long term (current) use of hormonal contraceptives: Secondary | ICD-10-CM

## 2021-10-03 LAB — URINALYSIS, MICROSCOPIC (REFLEX)
Bacteria, UA: NONE SEEN
WBC, UA: >50 WBC/hpf (ref 0–5)

## 2021-10-03 LAB — URINALYSIS, ROUTINE W REFLEX MICROSCOPIC
Bilirubin Urine: NEGATIVE
Glucose, UA: NEGATIVE mg/dL
Ketones, ur: NEGATIVE mg/dL
Nitrite: NEGATIVE
Protein, ur: 100 mg/dL — AB
Specific Gravity, Urine: 1.02 (ref 1.005–1.030)
pH: 5.5 (ref 5.0–8.0)

## 2021-10-03 LAB — BASIC METABOLIC PANEL
Anion gap: 12 (ref 5–15)
BUN: 100 mg/dL — ABNORMAL HIGH (ref 8–23)
CO2: 21 mmol/L — ABNORMAL LOW (ref 22–32)
Calcium: 8.6 mg/dL — ABNORMAL LOW (ref 8.9–10.3)
Chloride: 105 mmol/L (ref 98–111)
Creatinine, Ser: 4.18 mg/dL — ABNORMAL HIGH (ref 0.44–1.00)
GFR, Estimated: 9 mL/min — ABNORMAL LOW (ref >60)
Glucose, Bld: 129 mg/dL — ABNORMAL HIGH (ref 70–99)
Potassium: 4.3 mmol/L (ref 3.5–5.1)
Sodium: 138 mmol/L (ref 135–145)

## 2021-10-03 LAB — CBC
HCT: 29.1 % — ABNORMAL LOW (ref 36.0–46.0)
Hemoglobin: 9.7 g/dL — ABNORMAL LOW (ref 12.0–15.0)
MCH: 32.9 pg (ref 26.0–34.0)
MCHC: 33.3 g/dL (ref 30.0–36.0)
MCV: 98.6 fL (ref 80.0–100.0)
Platelets: 211 10*3/uL (ref 150–400)
RBC: 2.95 MIL/uL — ABNORMAL LOW (ref 3.87–5.11)
RDW: 13.3 % (ref 11.5–15.5)
WBC: 13.1 10*3/uL — ABNORMAL HIGH (ref 4.0–10.5)
nRBC: 0 % (ref 0.0–0.2)

## 2021-10-03 LAB — RESP PANEL BY RT-PCR (FLU A&B, COVID) ARPGX2
Influenza A by PCR: NEGATIVE
Influenza B by PCR: NEGATIVE
SARS Coronavirus 2 by RT PCR: NEGATIVE

## 2021-10-03 LAB — PHOSPHORUS: Phosphorus: 5.2 mg/dL — ABNORMAL HIGH (ref 2.5–4.6)

## 2021-10-03 LAB — LACTIC ACID, PLASMA: Lactic Acid, Venous: 1.4 mmol/L (ref 0.5–1.9)

## 2021-10-03 LAB — MAGNESIUM: Magnesium: 2.3 mg/dL (ref 1.7–2.4)

## 2021-10-03 LAB — PROCALCITONIN: Procalcitonin: 18.64 ng/mL

## 2021-10-03 MED ORDER — ACETAMINOPHEN 325 MG PO TABS
650.0000 mg | ORAL_TABLET | Freq: Four times a day (QID) | ORAL | Status: DC | PRN
  Administered 2021-10-03: 650 mg via ORAL
  Filled 2021-10-03: qty 2

## 2021-10-03 MED ORDER — ACETAMINOPHEN 325 MG PO TABS: 650.0000 mg | ORAL_TABLET | Freq: Once | ORAL | Status: DC | PRN

## 2021-10-03 MED ORDER — BUSPIRONE HCL 5 MG PO TABS: 7.5000 mg | ORAL_TABLET | ORAL | Status: DC

## 2021-10-03 MED ORDER — ACETAMINOPHEN 650 MG RE SUPP
650.0000 mg | Freq: Four times a day (QID) | RECTAL | Status: DC | PRN
  Filled 2021-10-03: qty 1

## 2021-10-03 MED ORDER — SODIUM CHLORIDE 0.9 % IV SOLN: 2.0000 g | INTRAVENOUS | Status: DC

## 2021-10-03 MED ORDER — SODIUM CHLORIDE 0.9 % IV SOLN
1.0000 g | Freq: Once | INTRAVENOUS | Status: AC
  Administered 2021-10-03: 1 g via INTRAVENOUS
  Filled 2021-10-03: qty 10

## 2021-10-03 MED ORDER — LACTATED RINGERS IV BOLUS
1000.0000 mL | Freq: Once | INTRAVENOUS | Status: AC
  Administered 2021-10-03: 1000 mL via INTRAVENOUS

## 2021-10-03 MED ORDER — ONDANSETRON HCL 4 MG PO TABS: 4.0000 mg | ORAL_TABLET | Freq: Four times a day (QID) | ORAL | Status: DC | PRN

## 2021-10-03 MED ORDER — HEPARIN SODIUM (PORCINE) 5000 UNIT/ML IJ SOLN
5000.0000 [IU] | Freq: Three times a day (TID) | INTRAMUSCULAR | Status: DC
  Administered 2021-10-04 – 2021-10-06 (×6): 5000 [IU] via SUBCUTANEOUS
  Filled 2021-10-03 (×6): qty 1

## 2021-10-03 MED ORDER — ONDANSETRON HCL 4 MG/2ML IJ SOLN: 4.0000 mg | Freq: Four times a day (QID) | INTRAMUSCULAR | Status: DC | PRN

## 2021-10-03 MED ORDER — MELATONIN 5 MG PO TABS
5.0000 mg | ORAL_TABLET | Freq: Every day | ORAL | Status: DC
  Administered 2021-10-03 – 2021-10-05 (×3): 5 mg via ORAL
  Filled 2021-10-03 (×3): qty 1

## 2021-10-03 MED ORDER — SODIUM CHLORIDE 0.9 % IV SOLN: INTRAVENOUS | Status: AC

## 2021-10-03 MED ORDER — LORAZEPAM 0.5 MG PO TABS
0.5000 mg | ORAL_TABLET | Freq: Every day | ORAL | Status: DC
  Administered 2021-10-03 – 2021-10-05 (×3): 0.5 mg via ORAL
  Filled 2021-10-03 (×3): qty 1

## 2021-10-03 MED ORDER — AMLODIPINE BESYLATE 5 MG PO TABS
10.0000 mg | ORAL_TABLET | Freq: Every day | ORAL | Status: DC
  Administered 2021-10-03: 10 mg via ORAL
  Filled 2021-10-03: qty 2

## 2021-10-03 MED ORDER — BUSPIRONE HCL 15 MG PO TABS
7.5000 mg | ORAL_TABLET | Freq: Two times a day (BID) | ORAL | Status: DC
  Administered 2021-10-03 – 2021-10-06 (×6): 7.5 mg via ORAL
  Filled 2021-10-03 (×3): qty 1
  Filled 2021-10-03: qty 2
  Filled 2021-10-03: qty 1
  Filled 2021-10-03 (×2): qty 2
  Filled 2021-10-03: qty 1

## 2021-10-03 NOTE — ED Provider Notes (Signed)
[facility]  ____________________________________________   Event Date/Time   First MD Initiated Contact with Patient 10/03/21 1558     (approximate)  I have reviewed the triage vital signs and the nursing notes.   HISTORY  Chief Complaint Fall and Weakness    HPI April Green is a 87 y.o. 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  Patient notes that on Monday approximately 3 days ago she was in the kitchen getting something out of a cabinet when she felt lightheaded and fell backwards hitting her head.  She did not lose consciousness.  She was able to get up on her own and has had some lower back discomfort but not significant.  However since that time she has felt generally weak and has had recurrent lightheadedness.  She has had decreased p.o. intake and had a fever to 103 last night.  She received Tylenol prior to coming to the ED today.  She denies chest pain cough shortness of breath.  Denies abdominal pain nausea vomiting or dysuria.  Patient has a history of UTI but says that she does not generally know when she has urinary tract infection.  No sick contacts.  Patient's niece notes that she has been seeing nephrology for CKD and hyperkalemia and was started on torsemide several weeks ago.         Past Medical History:  Diagnosis Date   Acute cystitis    Anxiety    Cancer ([place])    skin cancer   Chronic UTI    Depression    Dyspnea    Dysrhythmia    tachy arrythmia   Glaucoma    History of kidney stones    Hydronephrosis    stage IV ckd   Hypertension    Incomplete bladder emptying    Macular degeneration    PONV (postoperative nausea and vomiting)    nausea only   Renal cyst    SUI (stress urinary incontinence, female)    Unsteady gait    UPJ obstruction, congenital    Vertigo    Weight loss,  unintentional 04/2021   having difficulty keeping weight on    Patient Active Problem List   Diagnosis Date Noted   Recurrent UTI 11/07/2015   Atrophic vaginitis 11/07/2015   Urinary tract infectious disease 10/19/2015   UPJ obstruction, congenital 10/19/2015    Past Surgical History:  Procedure Laterality Date   ABDOMINAL HYSTERECTOMY     cysto stent exchange     once a year for last 6 years. metal stent   CYSTOSCOPY W/ RETROGRADES Right 02/16/2018   Procedure: CYSTOSCOPY WITH RETROGRADE PYELOGRAM;  Surgeon: Hollice Espy, MD;  Location: [facility];  Service: Urology;  Laterality: Right;   CYSTOSCOPY W/ RETROGRADES Right 04/24/2020   Procedure: CYSTOSCOPY WITH RETROGRADE PYELOGRAM;  Surgeon: Hollice Espy, MD;  Location: [facility];  Service: Urology;  Laterality: Right;   CYSTOSCOPY W/ RETROGRADES Right 04/16/2021   Procedure: CYSTOSCOPY WITH RETROGRADE PYELOGRAM;  Surgeon: Hollice Espy, MD;  Location: [facility];  Service: Urology;  Laterality: Right;   CYSTOSCOPY W/ URETERAL STENT PLACEMENT Right 01/01/2016   Procedure: CYSTOSCOPY WITH STENT REPLACEMENT;  Surgeon: Hollice Espy, MD;  Location: [facility];  Service: Urology;  Laterality: Right;   CYSTOSCOPY W/ URETERAL STENT PLACEMENT Right 02/18/2017   Procedure: CYSTOSCOPY WITH STENT REPLACEMENT;  Surgeon: Hollice Espy, MD;  Location: [facility];  Service: Urology;  Laterality: Right;   CYSTOSCOPY W/ URETERAL STENT PLACEMENT Right 02/16/2018   Procedure: CYSTOSCOPY WITH STENT REPLACEMENT;  Surgeon: Hollice Espy, MD;  Location: [facility];  Service: Urology;  Laterality: Right;   CYSTOSCOPY W/ URETERAL STENT PLACEMENT Right 06/07/2019   Procedure: CYSTOSCOPY WITH STENT REPLACEMENT;  Surgeon: Hollice Espy, MD;  Location: [facility];  Service: Urology;  Laterality: Right;   CYSTOSCOPY W/ URETERAL STENT PLACEMENT Right 04/24/2020   Procedure: CYSTOSCOPY WITH STENT REPLACEMENT;  Surgeon: Hollice Espy, MD;  Location: [facility];  Service:  Urology;  Laterality: Right;  metal stent   CYSTOSCOPY W/ URETERAL STENT PLACEMENT Right 04/16/2021   Procedure: CYSTOSCOPY WITH STENT REPLACEMENT;  Surgeon: Hollice Espy, MD;  Location: [facility];  Service: Urology;  Laterality: Right;   EYE SURGERY Bilateral    cataract extractions   HERNIA REPAIR Left 05/2008   inguinal hernia    Prior to Admission medications   Medication Sig Start Date End Date Taking? Authorizing Provider  acetaminophen (TYLENOL) 325 MG tablet Take 325-650 mg by mouth every 6 (six) hours as needed for moderate pain.    [provider]  amLODipine (NORVASC) 10 MG tablet Take 10 mg by mouth at bedtime.  03/06/15   [provider]  busPIRone (BUSPAR) 7.5 MG tablet Take 7.5 mg by mouth every morning. 03/24/21   [provider]  ciprofloxacin (CIPRO) 250 MG tablet Take 1 tablet (250 mg total) by mouth 2 (two) times daily. Start taking 3 days prior to procedure 04/13/21   Hollice Espy, MD  citalopram (CELEXA) 10 MG tablet Take 5 mg by mouth daily.  01/12/18   [provider]  estradiol (ESTRACE VAGINAL) 0.1 MG/GM vaginal cream Apply 0.5mg  (pea-sized amount)  just inside the vaginal introitus with a finger-tip every night for two weeks and then Monday, Wednesday and Friday nights. 02/24/20   Hollice Espy, MD  latanoprost (XALATAN) 0.005 % ophthalmic solution Place 1 drop into both eyes at bedtime.     [provider]  LORazepam (ATIVAN) 0.5 MG tablet Take 0.5 mg by mouth at bedtime.    [provider]  losartan (COZAAR) 25 MG tablet Take 25 mg by mouth daily.  09/11/15   [provider]  meclizine (ANTIVERT) 25 MG tablet Take 25 mg by mouth 3 (three) times daily as needed for dizziness.    [provider]  Melatonin 5 MG CAPS Take 5 mg by mouth at bedtime.  12/03/19   [provider]  Multiple Vitamin (MULTIVITAMIN) capsule Take 1 capsule by mouth daily.     [provider]  Multiple  Vitamins-Minerals (PRESERVISION AREDS PO) Take 1 tablet by mouth 2 (two) times daily. Areds 2    [provider]  timolol (TIMOPTIC) 0.5 % ophthalmic solution Place 1 drop into both eyes daily. 04/07/20   [provider]    Allergies Sulfa antibiotics, Amoxicillin-pot clavulanate, Azithromycin, Diphenhydramine, Trazodone and nefazodone, Lokelma [sodium zirconium cyclosilicate], 5-methoxypsoralen, Aspirin, Ceftriaxone, Flexeril [cyclobenzaprine], Levofloxacin, Mirtazapine, and Oxycodone  Family History  Problem Relation Age of Onset   Kidney disease Neg Hx    Bladder Cancer Neg Hx    Breast cancer Neg Hx     Social History Social History   Tobacco Use   Smoking status: Never   Smokeless tobacco: Never  Substance Use Topics   Alcohol use: No    Alcohol/week: 0.0 standard drinks   Drug use: No    Review of Systems   Review of Systems  Constitutional:  Positive for appetite change, chills and fever.  Respiratory:  Negative for cough and shortness of breath.   Cardiovascular:  Negative for chest pain.  Gastrointestinal:  Negative for abdominal pain, diarrhea, nausea and vomiting.  Genitourinary:  Negative for difficulty urinating and dysuria.  Neurological:  Positive for syncope and light-headedness. Negative for headaches.  All other systems reviewed and are negative.  Physical Exam Updated Vital Signs BP (!) 106/48 (BP Location: Left Arm)   Pulse 78   Temp 97.7 F (36.5 C) (Oral)   Resp 16   Ht 5\' 1"  (1.549 m)   Wt 36.3 kg   SpO2 97%   BMI 15.12 kg/m   Physical Exam Vitals and nursing note reviewed.  Constitutional:      General: She is not in acute distress.    Appearance: Normal appearance.     Comments: Patient is emaciated  HENT:     Head: Normocephalic and atraumatic.     Mouth/Throat:     Mouth: Mucous membranes are dry.  Eyes:     General: No scleral icterus.    Conjunctiva/sclera: Conjunctivae normal.  Cardiovascular:     Rate and  Rhythm: Normal rate and regular rhythm.  Pulmonary:     Effort: Pulmonary effort is normal. No respiratory distress.     Breath sounds: No stridor.  Abdominal:     General: Abdomen is flat. There is no distension.     Palpations: Abdomen is soft.     Tenderness: There is no abdominal tenderness. There is no guarding.  Musculoskeletal:        General: No deformity or signs of injury.     Cervical back: Normal range of motion.     Comments: No C, T or L-spine tenderness, no chest wall or flank tenderness, pelvis is stable nontender able to range the hips without issue no focal bony tenderness or deformity of the bilateral upper and lower extremities  Skin:    General: Skin is dry.     Coloration: Skin is not jaundiced or pale.  Neurological:     General: No focal deficit present.     Mental Status: She is alert and oriented to person, place, and time. Mental status is at baseline.     Comments: Patient is hard of hearing  Psychiatric:        Mood and Affect: Mood normal.        Behavior: Behavior normal.     LABS (all labs ordered are listed, but only abnormal results are displayed)  Labs Reviewed  BASIC METABOLIC PANEL - Abnormal; Notable for the following components:      Result Value   CO2 21 (*)    Glucose, Bld 129 (*)    BUN 100 (*)    Creatinine, Ser 4.18 (*)    Calcium 8.6 (*)    GFR, Estimated 9 (*)    All other components within normal limits  CBC - Abnormal; Notable for the following components:   WBC 13.1 (*)    RBC 2.95 (*)    Hemoglobin 9.7 (*)    HCT 29.1 (*)    All other components within normal limits  URINALYSIS, ROUTINE W REFLEX MICROSCOPIC - Abnormal; Notable for the following components:   Hgb urine dipstick SMALL (*)    Protein, ur 100 (*)    Leukocytes,Ua LARGE (*)    All other components within normal limits  URINE CULTURE  RESP PANEL BY RT-PCR (FLU A&B, COVID) ARPGX2  URINALYSIS,  MICROSCOPIC (REFLEX)  SODIUM, URINE, RANDOM  OSMOLALITY, URINE    ____________________________________________  EKG  Normal sinus rhythm, left axis deviation, no acute ischemic changes ____________________________________________  RADIOLOGY I, Madelin Headings, personally viewed and evaluated these images (plain radiographs) as part of my medical decision making, as well as reviewing the written report by the radiologist.  ED MD interpretation:    I reviewed the CT scan of the brain which does not show any acute intracranial process   I reviewed the CT of the cervical spine which does not show any acute fracture or misalignment   I reviewed the CXR which does not show any acute cardiopulmonary process       ____________________________________________   PROCEDURES  Procedure(s) performed (including Critical Care):  Procedures   ____________________________________________   INITIAL IMPRESSION / ASSESSMENT AND PLAN / ED COURSE     The patient is a 87 year old female with history of CKD who presents with fall, fever lightheadedness at home.  Blood pressure is borderline, pressure 106/48.  Patient is very thin but overall appears well.  She has no signs of trauma no focal midline tenderness of her back extremities or pelvis.  Apparently she had a fever to 103 at home but is afebrile here but did get Tylenol prior to arrival.  Her UA is consistent with infection, she does have a leukocytosis in the setting of her fever and frequent UTIs we will treat as UTI with Rocephin.  We will also send a urine culture.  Patient's creatinine notably is 4 from a baseline of around 2 2 weeks ago her BUN is nearly 100.  She looks dry and with her having been started on torsemide for the hypokalemia I suspect that she is dehydrated.  She does not have any signs of obstruction she still able to urinate.  Her potassium is normal today.  We will give her a liter of fluids and send urine studies.  She will require admission for AKI and UTI.       ____________________________________________   FINAL CLINICAL IMPRESSION(S) / ED DIAGNOSES  Final diagnoses:  AKI (acute kidney injury) ([place])  Lower urinary tract infectious disease     ED Discharge Orders     None        Note:  This document was prepared using Dragon voice recognition software and may include unintentional dictation errors.    Rada Hay, MD 10/03/21 1911

## 2021-10-03 NOTE — ED Triage Notes (Addendum)
Pt to ED [facility] from home for fall Saturday. C/o back pain. Denies LOC, hitting head or blood thinners.  Afebrile on arrival.  Answering all orientation questions appropriately. NAD noted Cannot say what caused fall

## 2021-10-03 NOTE — ED Notes (Signed)
Pt back from CT, unable to urinate at this time

## 2021-10-03 NOTE — ED Notes (Signed)
Report received from Maggie, RN

## 2021-10-03 NOTE — H&P (Signed)
History and Physical   ANITA LAGUNA WGY:[id number] DOB: May 01, 1923 DOA: 10/03/2021  PCP: [name], MD  Outpatient Specialists: Dr. Mackey Birchwood, [facility] nephrology Patient coming from: Home via EMS  I have personally briefly reviewed patient's old medical records in [place].  Chief Concern: [place] on Saturday  HPI: April Green is a 87 y.o. female with medical history significant for hypertension, who presents emergency department from home for chief concerns of falling on Saturday and had a fever on 10/02/2021.  Per family member, patient's fever T-max was 103 at home on 10/02/2021.  Family also endorsed a fever prior to ED presentation.  Per Bethena Roys, niece, states patient developed increased shakiness and trembling on 10/02/21 and   Patient endorsed that she has had decreased urination in the last two days. She denies dysuria and hematuria. She state she has been urinating regularly.   Social history: She lives at home by herself and has family members who care for her 24 hrs per day. She formerly worked in a [employer] and [employer]. She is a former tobacco user quitting at least 30 years ago. She denies etoh and recreational drug use.   Vaccination history: She has had all her covid shots.   ROS: Constitutional: no weight change, no fever ENT/Mouth: no sore throat, no rhinorrhea Eyes: no eye pain, no vision changes Cardiovascular: no chest pain, no dyspnea,  no edema, no palpitations Respiratory: no cough, no sputum, no wheezing Gastrointestinal: no nausea, no vomiting, no diarrhea, no constipation Genitourinary: no urinary incontinence, no dysuria, no hematuria Musculoskeletal: no arthralgias, no myalgias Skin: no skin lesions, no pruritus, Neuro: + weakness, no loss of consciousness, no syncope Psych: no anxiety, no depression, + decrease appetite Heme/Lymph: no bruising, no bleeding  ED Course: Discussed with ED provider, patient requiring hospitalization for chief  concerns of possible sepsis.  Labs in the emergency department was remarkable for temperature of 97.7, respiration rate of 16, heart rate of 78, increased to 82, initial blood pressure was 106/48, and improved to 112/57, SPO2 of 97% on room air.  Labs in the emergency department was remarkable for serum sodium 138, potassium 4.3, chloride 105, bicarb of 21, BUN of 100, serum creatinine of 4.18, nonfasting blood glucose 129, WBC 13.1, hemoglobin 9.7, platelets 211.  COVID/influenza A/influenza B PCR were negative.  Assessment/Plan  Principal Problem:   Sepsis ([place]) Active Problems:   Essential hypertension   AKI (acute kidney injury) ([place])   Leukocytosis   # Patient met sepsis criteria-etiology work-up in progress - Fever, leukocytosis, source is present - I presume this is secondary to UTI in origin given that large leukocytes showed on UA - Urine culture pending - Given that lactic acid was not elevated, I have low clinical suspicion for bacteremia at this time  # Hypertension-resumed home amlodipine 10 mg nightly  # Acute kidney injury-presumed secondary to UTI - Status post lactated ringer 1 L bolus, will give sodium chloride 100 mL/h for 10 hours - Encourage p.o. intake  # Anxiety/depression-resumed home buspirone 7.5 mg p.o. twice daily, lorazepam 0.5 mg nightly  # insomnia-resumed home melatonin 5 mg nightly  Chart reviewed.   DVT prophylaxis: Heparin 5000 units subcutaneous every 8 hours Code Status: DNR Diet: Heart healthy Family Communication: Updated Bethena Roys, niece at bedside.  Disposition Plan: Pending clinical course, anticipate less than 2 midnight stay Consults called: None at this time Admission status: MedSurg, observation  Past Medical History:  Diagnosis Date   Acute cystitis  Anxiety    Cancer (HCC)    skin cancer   Chronic UTI    Depression    Dyspnea    Dysrhythmia    tachy arrythmia   Glaucoma    History of kidney stones    Hydronephrosis     stage IV ckd   Hypertension    Incomplete bladder emptying    Macular degeneration    PONV (postoperative nausea and vomiting)    nausea only   Renal cyst    SUI (stress urinary incontinence, female)    Unsteady gait    UPJ obstruction, congenital    Vertigo    Weight loss, unintentional 04/2021   having difficulty keeping weight on   Past Surgical History:  Procedure Laterality Date   ABDOMINAL HYSTERECTOMY     cysto stent exchange     once a year for last 6 years. metal stent   CYSTOSCOPY W/ RETROGRADES Right 02/16/2018   Procedure: CYSTOSCOPY WITH RETROGRADE PYELOGRAM;  Surgeon: Vanna Scotland, MD;  Location: [facility];  Service: Urology;  Laterality: Right;   CYSTOSCOPY W/ RETROGRADES Right 04/24/2020   Procedure: CYSTOSCOPY WITH RETROGRADE PYELOGRAM;  Surgeon: Vanna Scotland, MD;  Location: [facility];  Service: Urology;  Laterality: Right;   CYSTOSCOPY W/ RETROGRADES Right 04/16/2021   Procedure: CYSTOSCOPY WITH RETROGRADE PYELOGRAM;  Surgeon: Vanna Scotland, MD;  Location: [facility];  Service: Urology;  Laterality: Right;   CYSTOSCOPY W/ URETERAL STENT PLACEMENT Right 01/01/2016   Procedure: CYSTOSCOPY WITH STENT REPLACEMENT;  Surgeon: Vanna Scotland, MD;  Location: [facility];  Service: Urology;  Laterality: Right;   CYSTOSCOPY W/ URETERAL STENT PLACEMENT Right 02/18/2017   Procedure: CYSTOSCOPY WITH STENT REPLACEMENT;  Surgeon: Vanna Scotland, MD;  Location: [facility];  Service: Urology;  Laterality: Right;   CYSTOSCOPY W/ URETERAL STENT PLACEMENT Right 02/16/2018   Procedure: CYSTOSCOPY WITH STENT REPLACEMENT;  Surgeon: Vanna Scotland, MD;  Location: [facility];  Service: Urology;  Laterality: Right;   CYSTOSCOPY W/ URETERAL STENT PLACEMENT Right 06/07/2019   Procedure: CYSTOSCOPY WITH STENT REPLACEMENT;  Surgeon: Vanna Scotland, MD;  Location: [facility];  Service: Urology;  Laterality: Right;   CYSTOSCOPY W/ URETERAL STENT PLACEMENT Right 04/24/2020   Procedure: CYSTOSCOPY WITH  STENT REPLACEMENT;  Surgeon: Vanna Scotland, MD;  Location: [facility];  Service: Urology;  Laterality: Right;  metal stent   CYSTOSCOPY W/ URETERAL STENT PLACEMENT Right 04/16/2021   Procedure: CYSTOSCOPY WITH STENT REPLACEMENT;  Surgeon: Vanna Scotland, MD;  Location: [facility];  Service: Urology;  Laterality: Right;   EYE SURGERY Bilateral    cataract extractions   HERNIA REPAIR Left 05/2008   inguinal hernia   Social History:  reports that she has never smoked. She has never used smokeless tobacco. She reports that she does not drink alcohol and does not use drugs.  Allergies  Allergen Reactions   Sulfa Antibiotics Anaphylaxis   Amoxicillin-Pot Clavulanate Diarrhea and Nausea Only    Stomach pains   Azithromycin Nausea Only and Other (See Comments)    Confusion also   Diphenhydramine Other (See Comments)    Confusion   Trazodone And Nefazodone Other (See Comments)    Disorientation and sleep deprivation   Lokelma [Sodium Zirconium Cyclosilicate] Other (See Comments)    edema   5-Methoxypsoralen Other (See Comments)    Altered mental status Used to treat psoriasis and/or vitiligo   Aspirin Other (See Comments)    EARS START TO RING   Ceftriaxone Nausea Only   Flexeril [Cyclobenzaprine] Nausea Only  Levofloxacin Other (See Comments)    Causes pain in calf of leg when walking  Levaquin   Mirtazapine Other (See Comments)    Dizzy   Oxycodone Other (See Comments)    Causes heavy drowsiness   Family History  Problem Relation Age of Onset   Kidney disease Neg Hx    Bladder Cancer Neg Hx    Breast cancer Neg Hx    Family history: Family history reviewed and not pertinent  Prior to Admission medications   Medication Sig Start Date End Date Taking? Authorizing Provider  acetaminophen (TYLENOL) 325 MG tablet Take 325-650 mg by mouth every 6 (six) hours as needed for moderate pain.    [provider]  amLODipine (NORVASC) 10 MG tablet Take 10 mg by mouth at bedtime.   03/06/15   [provider]  busPIRone (BUSPAR) 7.5 MG tablet Take 7.5 mg by mouth every morning. 03/24/21   [provider]  ciprofloxacin (CIPRO) 250 MG tablet Take 1 tablet (250 mg total) by mouth 2 (two) times daily. Start taking 3 days prior to procedure 04/13/21   Hollice Espy, MD  citalopram (CELEXA) 10 MG tablet Take 5 mg by mouth daily.  01/12/18   [provider]  estradiol (ESTRACE VAGINAL) 0.1 MG/GM vaginal cream Apply 0.$RemoveBefor'5mg'bZpqSIWDjnBr$  (pea-sized amount)  just inside the vaginal introitus with a finger-tip every night for two weeks and then Monday, Wednesday and Friday nights. 02/24/20   Hollice Espy, MD  latanoprost (XALATAN) 0.005 % ophthalmic solution Place 1 drop into both eyes at bedtime.     [provider]  LORazepam (ATIVAN) 0.5 MG tablet Take 0.5 mg by mouth at bedtime.    [provider]  losartan (COZAAR) 25 MG tablet Take 25 mg by mouth daily.  09/11/15   [provider]  meclizine (ANTIVERT) 25 MG tablet Take 25 mg by mouth 3 (three) times daily as needed for dizziness.    [provider]  Melatonin 5 MG CAPS Take 5 mg by mouth at bedtime.  12/03/19   [provider]  Multiple Vitamin (MULTIVITAMIN) capsule Take 1 capsule by mouth daily.     [provider]  Multiple Vitamins-Minerals (PRESERVISION AREDS PO) Take 1 tablet by mouth 2 (two) times daily. Areds 2    [provider]  timolol (TIMOPTIC) 0.5 % ophthalmic solution Place 1 drop into both eyes daily. 04/07/20   [provider]   Physical Exam: Vitals:   10/03/21 1738 10/03/21 1800 10/03/21 1830 10/03/21 1900  BP: (!) 134/54 (!) 112/57 (!) 118/59 131/66  Pulse: 81 79 82 93  Resp: (!) 22 (!) $Remo'22 17 19  'bonjW$ Temp:      TempSrc:      SpO2: 98% 97% 95% 98%  Weight:      Height:       Constitutional: appears younger than chronological age, frail, cachectic, NAD, calm, comfortable Eyes: PERRL, lids and conjunctivae normal ENMT: Mucous  membranes are moist. Posterior pharynx clear of any exudate or lesions. Age-appropriate dentition.  Mild to moderate hearing loss Neck: normal, supple, no masses, no thyromegaly Respiratory: clear to auscultation bilaterally, no wheezing, no crackles. Normal respiratory effort. No accessory muscle use.  Cardiovascular: Regular rate and rhythm, no murmurs / rubs / gallops. No extremity edema. 2+ pedal pulses. No carotid bruits.  Abdomen: no tenderness, no masses palpated, no hepatosplenomegaly. Bowel sounds positive.  Musculoskeletal: no clubbing / cyanosis. No joint deformity upper and lower extremities. Good ROM, no contractures, no atrophy.  Normal muscle tone.  Skin: no rashes, lesions, ulcers. No induration Neurologic: Sensation intact. Strength 5/5 in all 4.  Psychiatric: Normal judgment and insight. Alert and oriented x 3. Normal mood.   EKG: independently reviewed, showing's sinus rhythm with rate of 86, QTc 445  Chest x-ray on Admission: I personally reviewed and I agree with radiologist reading as below.  CT HEAD WO CONTRAST (5MM)  Result Date: 10/03/2021 CLINICAL DATA:  87 year old female with history of head and neck trauma after a fall on Saturday. EXAM: CT HEAD WITHOUT CONTRAST CT CERVICAL SPINE WITHOUT CONTRAST TECHNIQUE: Multidetector CT imaging of the head and cervical spine was performed following the standard protocol without intravenous contrast. Multiplanar CT image reconstructions of the cervical spine were also generated. COMPARISON:  CT the head and cervical spine 09/08/2018. FINDINGS: CT HEAD FINDINGS Brain: Moderate cerebral and cerebellar atrophy. Patchy and confluent areas of decreased attenuation are noted throughout the deep and periventricular white matter of the cerebral hemispheres bilaterally, compatible with chronic microvascular ischemic disease. Faint physiologic calcifications are noted in the basal ganglia bilaterally (left greater than right). No evidence of  acute infarction, hemorrhage, hydrocephalus, extra-axial collection or mass lesion/mass effect. Vascular: No hyperdense vessel or unexpected calcification. Skull: Normal. Negative for fracture or focal lesion. Sinuses/Orbits: No acute finding. Other: None. CT CERVICAL SPINE FINDINGS Alignment: Normal. Skull base and vertebrae: No acute fracture. No primary bone lesion or focal pathologic process. Soft tissues and spinal canal: No prevertebral fluid or swelling. No visible canal hematoma. Disc levels: Multilevel degenerative disc disease, most pronounced at C5-C6. Mild multilevel facet arthropathy. Upper chest: Negative. Other: None. IMPRESSION: 1. No evidence of significant acute traumatic injury to the skull, brain or cervical spine. 2. Moderate cerebral and cerebellar atrophy with extensive chronic microvascular ischemic changes in the cerebral white matter, as above. 3. Multilevel degenerative disc disease and cervical spondylosis, as above. Electronically Signed   By: Vinnie Langton M.D.   On: 10/03/2021 17:57   CT Cervical Spine Wo Contrast  Result Date: 10/03/2021 CLINICAL DATA:  87 year old female with history of head and neck trauma after a fall on Saturday. EXAM: CT HEAD WITHOUT CONTRAST CT CERVICAL SPINE WITHOUT CONTRAST TECHNIQUE: Multidetector CT imaging of the head and cervical spine was performed following the standard protocol without intravenous contrast. Multiplanar CT image reconstructions of the cervical spine were also generated. COMPARISON:  CT the head and cervical spine 09/08/2018. FINDINGS: CT HEAD FINDINGS Brain: Moderate cerebral and cerebellar atrophy. Patchy and confluent areas of decreased attenuation are noted throughout the deep and periventricular white matter of the cerebral hemispheres bilaterally, compatible with chronic microvascular ischemic disease. Faint physiologic calcifications are noted in the basal ganglia bilaterally (left greater than right). No evidence of acute  infarction, hemorrhage, hydrocephalus, extra-axial collection or mass lesion/mass effect. Vascular: No hyperdense vessel or unexpected calcification. Skull: Normal. Negative for fracture or focal lesion. Sinuses/Orbits: No acute finding. Other: None. CT CERVICAL SPINE FINDINGS Alignment: Normal. Skull base and vertebrae: No acute fracture. No primary bone lesion or focal pathologic process. Soft tissues and spinal canal: No prevertebral fluid or swelling. No visible canal hematoma. Disc levels: Multilevel degenerative disc disease, most pronounced at C5-C6. Mild multilevel facet arthropathy. Upper chest: Negative. Other: None. IMPRESSION: 1. No evidence of significant acute traumatic injury to the skull, brain or cervical spine. 2. Moderate cerebral and cerebellar atrophy with extensive chronic microvascular ischemic changes in the cerebral white matter, as above. 3. Multilevel degenerative disc disease and cervical spondylosis, as above. Electronically  Signed   By: Vinnie Langton M.D.   On: 10/03/2021 17:57   DG Chest Port 1 View  Result Date: 10/03/2021 CLINICAL DATA:  [place] on Saturday EXAM: PORTABLE CHEST 1 VIEW COMPARISON:  09/26/2005 FINDINGS: No focal opacity, pleural effusion, or pneumothorax. Borderline to mild cardiomegaly. Aortic atherosclerosis. IMPRESSION: No active disease. Electronically Signed   By: Donavan Foil M.D.   On: 10/03/2021 17:23    Labs on Admission: I have personally reviewed following labs  CBC: Recent Labs  Lab 10/03/21 1142  WBC 13.1*  HGB 9.7*  HCT 29.1*  MCV 98.6  PLT 854   Basic Metabolic Panel: Recent Labs  Lab 10/03/21 1142 10/03/21 1840  NA 138  --   K 4.3  --   CL 105  --   CO2 21*  --   GLUCOSE 129*  --   BUN 100*  --   CREATININE 4.18*  --   CALCIUM 8.6*  --   MG  --  2.3  PHOS  --  5.2*   GFR: Estimated Creatinine Clearance: 4.5 mL/min (A) (by C-G formula based on SCr of 4.18 mg/dL (H)).  Urine analysis:    Component Value Date/Time    COLORURINE YELLOW 10/03/2021 1142   APPEARANCEUR CLEAR 10/03/2021 1142   APPEARANCEUR Cloudy (A) 04/10/2021 1005   LABSPEC 1.020 10/03/2021 1142   LABSPEC 1.004 04/13/2012 1932   PHURINE 5.5 10/03/2021 1142   GLUCOSEU NEGATIVE 10/03/2021 1142   GLUCOSEU Negative 04/13/2012 1932   HGBUR SMALL (A) 10/03/2021 1142   BILIRUBINUR NEGATIVE 10/03/2021 1142   BILIRUBINUR Negative 04/10/2021 1005   BILIRUBINUR Negative 04/13/2012 1932   KETONESUR NEGATIVE 10/03/2021 1142   PROTEINUR 100 (A) 10/03/2021 1142   NITRITE NEGATIVE 10/03/2021 1142   LEUKOCYTESUR LARGE (A) 10/03/2021 1142   LEUKOCYTESUR 3+ 04/13/2012 1932   Dr. Tobie Poet [facility]  If 7PM-7AM, please contact overnight-coverage provider If 7AM-7PM, please contact day coverage provider [email or web address]  10/03/2021, 7:43 PM

## 2021-10-03 NOTE — ED Notes (Signed)
Patient's niece states she prefers patient to stay in the room she is in as she just settled down and will be more confused and will stir up if moved to another room. Notified charge Theatre stage manager in C-POD.

## 2021-10-03 NOTE — ED Notes (Signed)
Report called to [facility], RN in C-POD. Patient to be moved to room 31.

## 2021-10-04 DIAGNOSIS — N390 Urinary tract infection, site not specified: Secondary | ICD-10-CM | POA: Diagnosis present

## 2021-10-04 DIAGNOSIS — A419 Sepsis, unspecified organism: Secondary | ICD-10-CM | POA: Diagnosis not present

## 2021-10-04 DIAGNOSIS — N184 Chronic kidney disease, stage 4 (severe): Secondary | ICD-10-CM | POA: Diagnosis present

## 2021-10-04 DIAGNOSIS — F32A Depression, unspecified: Secondary | ICD-10-CM | POA: Diagnosis present

## 2021-10-04 DIAGNOSIS — G4700 Insomnia, unspecified: Secondary | ICD-10-CM | POA: Diagnosis present

## 2021-10-04 DIAGNOSIS — R6510 Systemic inflammatory response syndrome (SIRS) of non-infectious origin without acute organ dysfunction: Secondary | ICD-10-CM

## 2021-10-04 DIAGNOSIS — R8281 Pyuria: Secondary | ICD-10-CM

## 2021-10-04 DIAGNOSIS — Z681 Body mass index (BMI) 19 or less, adult: Secondary | ICD-10-CM | POA: Diagnosis not present

## 2021-10-04 DIAGNOSIS — H9193 Unspecified hearing loss, bilateral: Secondary | ICD-10-CM | POA: Diagnosis present

## 2021-10-04 DIAGNOSIS — A4150 Gram-negative sepsis, unspecified: Secondary | ICD-10-CM | POA: Diagnosis not present

## 2021-10-04 DIAGNOSIS — Y92009 Unspecified place in unspecified non-institutional (private) residence as the place of occurrence of the external cause: Secondary | ICD-10-CM

## 2021-10-04 DIAGNOSIS — N179 Acute kidney failure, unspecified: Secondary | ICD-10-CM | POA: Diagnosis present

## 2021-10-04 DIAGNOSIS — D649 Anemia, unspecified: Secondary | ICD-10-CM

## 2021-10-04 DIAGNOSIS — R64 Cachexia: Secondary | ICD-10-CM | POA: Diagnosis present

## 2021-10-04 DIAGNOSIS — D631 Anemia in chronic kidney disease: Secondary | ICD-10-CM | POA: Diagnosis present

## 2021-10-04 DIAGNOSIS — N393 Stress incontinence (female) (male): Secondary | ICD-10-CM | POA: Diagnosis present

## 2021-10-04 DIAGNOSIS — E43 Unspecified severe protein-calorie malnutrition: Secondary | ICD-10-CM | POA: Diagnosis present

## 2021-10-04 DIAGNOSIS — Z79899 Other long term (current) drug therapy: Secondary | ICD-10-CM | POA: Diagnosis not present

## 2021-10-04 DIAGNOSIS — W19XXXA Unspecified fall, initial encounter: Secondary | ICD-10-CM

## 2021-10-04 DIAGNOSIS — I700 Atherosclerosis of aorta: Secondary | ICD-10-CM | POA: Diagnosis present

## 2021-10-04 DIAGNOSIS — F419 Anxiety disorder, unspecified: Secondary | ICD-10-CM

## 2021-10-04 DIAGNOSIS — Z87891 Personal history of nicotine dependence: Secondary | ICD-10-CM | POA: Diagnosis not present

## 2021-10-04 DIAGNOSIS — Z90710 Acquired absence of both cervix and uterus: Secondary | ICD-10-CM | POA: Diagnosis not present

## 2021-10-04 DIAGNOSIS — Z20822 Contact with and (suspected) exposure to covid-19: Secondary | ICD-10-CM | POA: Diagnosis present

## 2021-10-04 DIAGNOSIS — Z85828 Personal history of other malignant neoplasm of skin: Secondary | ICD-10-CM | POA: Diagnosis not present

## 2021-10-04 DIAGNOSIS — Q6239 Other obstructive defects of renal pelvis and ureter: Secondary | ICD-10-CM | POA: Diagnosis not present

## 2021-10-04 DIAGNOSIS — W1830XA Fall on same level, unspecified, initial encounter: Secondary | ICD-10-CM | POA: Diagnosis present

## 2021-10-04 DIAGNOSIS — H409 Unspecified glaucoma: Secondary | ICD-10-CM | POA: Diagnosis present

## 2021-10-04 DIAGNOSIS — I959 Hypotension, unspecified: Secondary | ICD-10-CM

## 2021-10-04 DIAGNOSIS — D72829 Elevated white blood cell count, unspecified: Secondary | ICD-10-CM | POA: Diagnosis not present

## 2021-10-04 DIAGNOSIS — Z66 Do not resuscitate: Secondary | ICD-10-CM | POA: Diagnosis present

## 2021-10-04 DIAGNOSIS — A4151 Sepsis due to Escherichia coli [E. coli]: Secondary | ICD-10-CM | POA: Diagnosis present

## 2021-10-04 DIAGNOSIS — I129 Hypertensive chronic kidney disease with stage 1 through stage 4 chronic kidney disease, or unspecified chronic kidney disease: Secondary | ICD-10-CM | POA: Diagnosis present

## 2021-10-04 DIAGNOSIS — I10 Essential (primary) hypertension: Secondary | ICD-10-CM | POA: Diagnosis not present

## 2021-10-04 LAB — CBC WITH DIFFERENTIAL/PLATELET
Abs Immature Granulocytes: 0.04 10*3/uL (ref 0.00–0.07)
Basophils Absolute: 0 10*3/uL (ref 0.0–0.1)
Basophils Relative: 0 %
Eosinophils Absolute: 0 10*3/uL (ref 0.0–0.5)
Eosinophils Relative: 1 %
HCT: 29.2 % — ABNORMAL LOW (ref 36.0–46.0)
Hemoglobin: 9.5 g/dL — ABNORMAL LOW (ref 12.0–15.0)
Immature Granulocytes: 1 %
Lymphocytes Relative: 8 %
Lymphs Abs: 0.7 10*3/uL (ref 0.7–4.0)
MCH: 31.6 pg (ref 26.0–34.0)
MCHC: 32.5 g/dL (ref 30.0–36.0)
MCV: 97 fL (ref 80.0–100.0)
Monocytes Absolute: 0.8 10*3/uL (ref 0.1–1.0)
Monocytes Relative: 9 %
Neutro Abs: 7.1 10*3/uL (ref 1.7–7.7)
Neutrophils Relative %: 81 %
Platelets: 201 10*3/uL (ref 150–400)
RBC: 3.01 MIL/uL — ABNORMAL LOW (ref 3.87–5.11)
RDW: 13.2 % (ref 11.5–15.5)
WBC: 8.7 10*3/uL (ref 4.0–10.5)
nRBC: 0 % (ref 0.0–0.2)

## 2021-10-04 LAB — BASIC METABOLIC PANEL
Anion gap: 11 (ref 5–15)
BUN: 92 mg/dL — ABNORMAL HIGH (ref 8–23)
CO2: 21 mmol/L — ABNORMAL LOW (ref 22–32)
Calcium: 8.2 mg/dL — ABNORMAL LOW (ref 8.9–10.3)
Chloride: 107 mmol/L (ref 98–111)
Creatinine, Ser: 3.7 mg/dL — ABNORMAL HIGH (ref 0.44–1.00)
GFR, Estimated: 11 mL/min — ABNORMAL LOW (ref >60)
Glucose, Bld: 103 mg/dL — ABNORMAL HIGH (ref 70–99)
Potassium: 4.3 mmol/L (ref 3.5–5.1)
Sodium: 139 mmol/L (ref 135–145)

## 2021-10-04 LAB — PROTIME-INR
INR: 1.1 (ref 0.8–1.2)
Prothrombin Time: 14 seconds (ref 11.4–15.2)

## 2021-10-04 LAB — CORTISOL-AM, BLOOD: Cortisol - AM: 19.2 ug/dL (ref 6.7–22.6)

## 2021-10-04 LAB — LACTIC ACID, PLASMA: Lactic Acid, Venous: 0.9 mmol/L (ref 0.5–1.9)

## 2021-10-04 LAB — CK: Total CK: 372 U/L — ABNORMAL HIGH (ref 38–234)

## 2021-10-04 MED ORDER — LACTATED RINGERS IV SOLN: INTRAVENOUS | Status: DC

## 2021-10-04 MED ORDER — SODIUM CHLORIDE 0.9 % IV SOLN
1.0000 g | INTRAVENOUS | Status: DC
  Administered 2021-10-04 – 2021-10-06 (×3): 1 g via INTRAVENOUS
  Filled 2021-10-04 (×3): qty 10

## 2021-10-04 NOTE — ED Notes (Signed)
Patient AOX4. Resp even, unlabored on RA. NSR on monitor at 80's. Denies pain or needs. LR infusing at 75 ml/hr with no s/s of infiltration. Patient's family member at bedside. No distress noted at this time.

## 2021-10-04 NOTE — ED Notes (Signed)
This RN assisted pt to bedside toilet to have a BM. Pt noted to small BM.  Pt one assist to bedside toilet.

## 2021-10-04 NOTE — Progress Notes (Signed)
PROGRESS NOTE  April Green:[id number] DOB: 25-Dec-1922   PCP: [name], MD  Patient is from: Home.  Patient lives alone.  Family members close by.  Uses walker at baseline.  DOA: 10/03/2021 LOS: 0  Chief complaints:  Chief Complaint  Patient presents with   Fall   Weakness     Brief Narrative / Interim history: 87 year old F with PMH of HTN, anxiety, depression, insomnia and CKD came to ED with fever, shaking and trembling, and admitted for AKI and possible urinary tract infection.  Reportedly febrile to 103 the day prior to presentation.  She also had ground-level fall when she hit her head on carpeted floor 5 days prior.  Denies LOC, vision change or local neurodeficit.  In ED, afebrile.  Slightly hypotensive on arrival.  97% on RA.  Became tachycardic to 110s with tachypnea to upper 20s at some point.  Mild leukocytosis.  UA with pyuria.  Urine culture obtained.  Started on IV fluid and IV ceftriaxone.   Subjective: Seen and examined earlier this morning.  Hypotensive to 88/40 about midnight last night.  This was about 2 hours after she received amlodipine.  BP improved.  Currently no complaints.  She denies headache, vision change, chest pain, dyspnea, GI or UTI symptoms.  Patient's niece at bedside.  Objective: Vitals:   10/04/21 0500 10/04/21 0600 10/04/21 0700 10/04/21 1016  BP: (!) 102/51 (!) 94/43 (!) 113/51 (!) 110/48  Pulse: 72 71 80 78  Resp: 17 16 18 18   Temp:    97.8 F (36.6 C)  TempSrc:    Oral  SpO2: 92% 93% 95% 97%  Weight:      Height:        Intake/Output Summary (Last 24 hours) at 10/04/2021 1242 Last data filed at 10/04/2021 0651 Gross per 24 hour  Intake 955 ml  Output --  Net 955 ml   Filed Weights   10/03/21 1145  Weight: 36.3 kg    Examination:  GENERAL: No apparent distress.  Nontoxic. HEENT: MMM.  Vision and hearing grossly intact.  NECK: Supple.  No apparent JVD.  RESP: 97% on RA.  No IWOB.  Fair aeration  bilaterally. CVS:  RRR. Heart sounds normal.  ABD/GI/GU: BS+. Abd soft, NTND.  MSK/EXT:  Moves extremities. No apparent deformity. No edema.  SKIN: no apparent skin lesion or wound NEURO: Awake, alert and oriented appropriately.  No apparent focal neuro deficit. PSYCH: Calm. Normal affect.   Procedures:  None  Microbiology summarized: [id number] and influenza PCR nonreactive. Urine culture pending. Blood culture pending.  Assessment & Plan: SIRS-no clear source of infection yet.  Reportedly febrile to 103 two days prior to presentation.  Patient denies UTI symptoms.  However, she has pyuria, leukocytosis and markedly elevated procalcitonin. -Check blood culture -Follow urine culture -IV ceftriaxone pending cultures.  Given improvement in AKI, BP and leukocytosis, won't escalate antibiotics.   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 intake.  She was also on low-dose torsemide which can contribute.  She denies NSAID use.  CK slightly elevated to 372 likely traumatic from recent fall. Recent Labs    04/10/21 1524 10/03/21 1142 10/04/21 0655  BUN 55* 100* 92*  CREATININE 1.99* 4.18* 3.70*  -Continue gentle IV fluid. -Continue holding home diuretics. -Monitor urine output -Intermittent bladder scan -Consider renal ultrasound if no improvement  Accidental fall at home-CT head and cervical spine without acute finding.  No focal neurodeficit.  No injuries. Traumatic rhabdomyolysis: Mild CK elevation -IV  fluid as above -Fall precaution -PT/OT eval  Normocytic anemia: H&H stable. Recent Labs    04/10/21 1524 10/03/21 1142 10/04/21 0655  HGB 9.7* 9.7* 9.5*  -Continue monitoring    Hypotension/history of hypertension -Continue gentle IV fluid -Discontinue amlodipine.  Hold home torsemide. -Consider further work-up if no improvement.     Anxiety/depression/insomnia: Stable. -Continue home medications  Leukocytosis: Resolved.   Underweight Body mass  index is 15.12 kg/m. -Consult dietitian.         DVT prophylaxis:  heparin injection 5,000 Units Start: 10/04/21 0600 Place TED hose Start: 10/03/21 1829  Code Status: DNR/DNI Family Communication: Updated patient's niece at bedside. Level of care: Med-Surg Status is: Observation  The patient will require care spanning > 2 midnights and should be moved to inpatient because: Need for IV antibiotics for possible UTI pending cultures, need for IV fluids due to AKI, hypotension and rhabdomyolysis      Consultants:  None   Sch Meds:  Scheduled Meds:  busPIRone  7.5 mg Oral BID   heparin  5,000 Units Subcutaneous Q8H   LORazepam  0.5 mg Oral QHS   melatonin  5 mg Oral QHS   Continuous Infusions:  cefTRIAXone (ROCEPHIN)  IV 1 g (10/04/21 1158)   lactated ringers     PRN Meds:.acetaminophen **OR** acetaminophen, acetaminophen, ondansetron **OR** ondansetron (ZOFRAN) IV  Antimicrobials: Anti-infectives (From admission, onward)    Start     Dose/Rate Route Frequency Ordered Stop   10/04/21 1000  cefTRIAXone (ROCEPHIN) 2 g in sodium chloride 0.9 % 100 mL IVPB  Status:  Discontinued        2 g 200 mL/hr over 30 Minutes Intravenous Every 24 hours 10/03/21 1830 10/04/21 0744   10/04/21 1000  cefTRIAXone (ROCEPHIN) 1 g in sodium chloride 0.9 % 100 mL IVPB        1 g 200 mL/hr over 30 Minutes Intravenous Every 24 hours 10/04/21 0748 10/08/21 0959   10/03/21 1630  cefTRIAXone (ROCEPHIN) 1 g in sodium chloride 0.9 % 100 mL IVPB        1 g 200 mL/hr over 30 Minutes Intravenous  Once 10/03/21 1619 10/03/21 1712        I have personally reviewed the following labs and images: CBC: Recent Labs  Lab 10/03/21 1142 10/04/21 0655  WBC 13.1* 8.7  NEUTROABS  --  7.1  HGB 9.7* 9.5*  HCT 29.1* 29.2*  MCV 98.6 97.0  PLT 211 201   BMP &GFR Recent Labs  Lab 10/03/21 1142 10/03/21 1840 10/04/21 0655  NA 138  --  139  K 4.3  --  4.3  CL 105  --  107  CO2 21*  --  21*   GLUCOSE 129*  --  103*  BUN 100*  --  92*  CREATININE 4.18*  --  3.70*  CALCIUM 8.6*  --  8.2*  MG  --  2.3  --   PHOS  --  5.2*  --    Estimated Creatinine Clearance: 5.1 mL/min (A) (by C-G formula based on SCr of 3.7 mg/dL (H)). Liver & Pancreas: No results for input(s): AST, ALT, ALKPHOS, BILITOT, PROT, ALBUMIN in the last 168 hours. No results for input(s): LIPASE, AMYLASE in the last 168 hours. No results for input(s): AMMONIA in the last 168 hours. Diabetic: No results for input(s): HGBA1C in the last 72 hours. No results for input(s): GLUCAP in the last 168 hours. Cardiac Enzymes: Recent Labs  Lab 10/04/21 0655  CKTOTAL 372*  No results for input(s): PROBNP in the last 8760 hours. Coagulation Profile: Recent Labs  Lab 10/04/21 0655  INR 1.1   Thyroid Function Tests: No results for input(s): TSH, T4TOTAL, FREET4, T3FREE, THYROIDAB in the last 72 hours. Lipid Profile: No results for input(s): CHOL, HDL, LDLCALC, TRIG, CHOLHDL, LDLDIRECT in the last 72 hours. Anemia Panel: No results for input(s): VITAMINB12, FOLATE, FERRITIN, TIBC, IRON, RETICCTPCT in the last 72 hours. Urine analysis:    Component Value Date/Time   COLORURINE YELLOW 10/03/2021 1142   APPEARANCEUR CLEAR 10/03/2021 1142   APPEARANCEUR Cloudy (A) 04/10/2021 1005   LABSPEC 1.020 10/03/2021 1142   LABSPEC 1.004 04/13/2012 1932   PHURINE 5.5 10/03/2021 1142   GLUCOSEU NEGATIVE 10/03/2021 1142   GLUCOSEU Negative 04/13/2012 1932   HGBUR SMALL (A) 10/03/2021 1142   BILIRUBINUR NEGATIVE 10/03/2021 1142   BILIRUBINUR Negative 04/10/2021 1005   BILIRUBINUR Negative 04/13/2012 1932   KETONESUR NEGATIVE 10/03/2021 1142   PROTEINUR 100 (A) 10/03/2021 1142   NITRITE NEGATIVE 10/03/2021 1142   LEUKOCYTESUR LARGE (A) 10/03/2021 1142   LEUKOCYTESUR 3+ 04/13/2012 1932   Sepsis Labs: Invalid input(s): PROCALCITONIN, [place]  Microbiology: Recent Results (from the past 240 hour(s))  Resp Panel by  RT-PCR (Flu A&B, Covid) Nasopharyngeal Swab     Status: None   Collection Time: 10/03/21  4:36 PM   Specimen: Nasopharyngeal Swab; Nasopharyngeal(NP) swabs in vial transport medium  Result Value Ref Range Status   SARS Coronavirus 2 by RT PCR NEGATIVE NEGATIVE Final    Comment: (NOTE) SARS-CoV-2 target nucleic acids are NOT DETECTED.  The SARS-CoV-2 RNA is generally detectable in upper respiratory specimens during the acute phase of infection. The lowest concentration of SARS-CoV-2 viral copies this assay can detect is 138 copies/mL. A negative result does not preclude SARS-Cov-2 infection and should not be used as the sole basis for treatment or other patient management decisions. A negative result may occur with  improper specimen collection/handling, submission of specimen other than nasopharyngeal swab, presence of viral mutation(s) within the areas targeted by this assay, and inadequate number of viral copies(<138 copies/mL). A negative result must be combined with clinical observations, patient history, and epidemiological information. The expected result is Negative.  Fact Sheet for Patients:  EntrepreneurPulse.com.au  Fact Sheet for Healthcare Providers:  IncredibleEmployment.be  This test is no t yet approved or cleared by the [place] FDA and  has been authorized for detection and/or diagnosis of SARS-CoV-2 by FDA under an Emergency Use Authorization (EUA). This EUA will remain  in effect (meaning this test can be used) for the duration of the COVID-19 declaration under Section 564(b)(1) of the Act, 21 U.S.C.section 360bbb-3(b)(1), unless the authorization is terminated  or revoked sooner.       Influenza A by PCR NEGATIVE NEGATIVE Final   Influenza B by PCR NEGATIVE NEGATIVE Final    Comment: (NOTE) The Xpert Xpress SARS-CoV-2/FLU/RSV plus assay is intended as an aid in the diagnosis of influenza from Nasopharyngeal swab  specimens and should not be used as a sole basis for treatment. Nasal washings and aspirates are unacceptable for Xpert Xpress SARS-CoV-2/FLU/RSV testing.  Fact Sheet for Patients: EntrepreneurPulse.com.au  Fact Sheet for Healthcare Providers: IncredibleEmployment.be  This test is not yet approved or cleared by the [place] FDA and has been authorized for detection and/or diagnosis of SARS-CoV-2 by FDA under an Emergency Use Authorization (EUA). This EUA will remain in effect (meaning this test can be used) for the duration of the COVID-19 declaration  under Section 564(b)(1) of the Act, 21 U.S.C. section 360bbb-3(b)(1), unless the authorization is terminated or revoked.  Performed at [facility], [address], [place], [place] [address]     Radiology Studies: CT HEAD WO CONTRAST (5MM)  Result Date: 10/03/2021 CLINICAL DATA:  87 year old female with history of head and neck trauma after a fall on Saturday. EXAM: CT HEAD WITHOUT CONTRAST CT CERVICAL SPINE WITHOUT CONTRAST TECHNIQUE: Multidetector CT imaging of the head and cervical spine was performed following the standard protocol without intravenous contrast. Multiplanar CT image reconstructions of the cervical spine were also generated. COMPARISON:  CT the head and cervical spine 09/08/2018. FINDINGS: CT HEAD FINDINGS Brain: Moderate cerebral and cerebellar atrophy. Patchy and confluent areas of decreased attenuation are noted throughout the deep and periventricular white matter of the cerebral hemispheres bilaterally, compatible with chronic microvascular ischemic disease. Faint physiologic calcifications are noted in the basal ganglia bilaterally (left greater than right). No evidence of acute infarction, hemorrhage, hydrocephalus, extra-axial collection or mass lesion/mass effect. Vascular: No hyperdense vessel or unexpected calcification. Skull: Normal. Negative for fracture or  focal lesion. Sinuses/Orbits: No acute finding. Other: None. CT CERVICAL SPINE FINDINGS Alignment: Normal. Skull base and vertebrae: No acute fracture. No primary bone lesion or focal pathologic process. Soft tissues and spinal canal: No prevertebral fluid or swelling. No visible canal hematoma. Disc levels: Multilevel degenerative disc disease, most pronounced at C5-C6. Mild multilevel facet arthropathy. Upper chest: Negative. Other: None. IMPRESSION: 1. No evidence of significant acute traumatic injury to the skull, brain or cervical spine. 2. Moderate cerebral and cerebellar atrophy with extensive chronic microvascular ischemic changes in the cerebral white matter, as above. 3. Multilevel degenerative disc disease and cervical spondylosis, as above. Electronically Signed   By: Vinnie Langton M.D.   On: 10/03/2021 17:57   CT Cervical Spine Wo Contrast  Result Date: 10/03/2021 CLINICAL DATA:  87 year old female with history of head and neck trauma after a fall on Saturday. EXAM: CT HEAD WITHOUT CONTRAST CT CERVICAL SPINE WITHOUT CONTRAST TECHNIQUE: Multidetector CT imaging of the head and cervical spine was performed following the standard protocol without intravenous contrast. Multiplanar CT image reconstructions of the cervical spine were also generated. COMPARISON:  CT the head and cervical spine 09/08/2018. FINDINGS: CT HEAD FINDINGS Brain: Moderate cerebral and cerebellar atrophy. Patchy and confluent areas of decreased attenuation are noted throughout the deep and periventricular white matter of the cerebral hemispheres bilaterally, compatible with chronic microvascular ischemic disease. Faint physiologic calcifications are noted in the basal ganglia bilaterally (left greater than right). No evidence of acute infarction, hemorrhage, hydrocephalus, extra-axial collection or mass lesion/mass effect. Vascular: No hyperdense vessel or unexpected calcification. Skull: Normal. Negative for fracture or focal  lesion. Sinuses/Orbits: No acute finding. Other: None. CT CERVICAL SPINE FINDINGS Alignment: Normal. Skull base and vertebrae: No acute fracture. No primary bone lesion or focal pathologic process. Soft tissues and spinal canal: No prevertebral fluid or swelling. No visible canal hematoma. Disc levels: Multilevel degenerative disc disease, most pronounced at C5-C6. Mild multilevel facet arthropathy. Upper chest: Negative. Other: None. IMPRESSION: 1. No evidence of significant acute traumatic injury to the skull, brain or cervical spine. 2. Moderate cerebral and cerebellar atrophy with extensive chronic microvascular ischemic changes in the cerebral white matter, as above. 3. Multilevel degenerative disc disease and cervical spondylosis, as above. Electronically Signed   By: Vinnie Langton M.D.   On: 10/03/2021 17:57   DG Chest Port 1 View  Result Date: 10/03/2021 CLINICAL DATA:  Fell on Saturday EXAM: PORTABLE CHEST 1 VIEW COMPARISON:  09/26/2005 FINDINGS: No focal opacity, pleural effusion, or pneumothorax. Borderline to mild cardiomegaly. Aortic atherosclerosis. IMPRESSION: No active disease. Electronically Signed   By: Donavan Foil M.D.   On: 10/03/2021 17:23      Eustacio Ellen T. [place]  If 7PM-7AM, please contact night-coverage [email or web address] 10/04/2021, 12:42 PM

## 2021-10-04 NOTE — ED Notes (Signed)
RN rounded on pt. Pt is resting. Equal chest rise and fall.

## 2021-10-04 NOTE — ED Notes (Signed)
Informed RN bed assigned 

## 2021-10-05 DIAGNOSIS — D72829 Elevated white blood cell count, unspecified: Secondary | ICD-10-CM | POA: Diagnosis not present

## 2021-10-05 DIAGNOSIS — Z66 Do not resuscitate: Secondary | ICD-10-CM

## 2021-10-05 DIAGNOSIS — I10 Essential (primary) hypertension: Secondary | ICD-10-CM | POA: Diagnosis not present

## 2021-10-05 DIAGNOSIS — A4150 Gram-negative sepsis, unspecified: Secondary | ICD-10-CM

## 2021-10-05 DIAGNOSIS — N390 Urinary tract infection, site not specified: Secondary | ICD-10-CM

## 2021-10-05 DIAGNOSIS — Z7189 Other specified counseling: Secondary | ICD-10-CM

## 2021-10-05 DIAGNOSIS — I959 Hypotension, unspecified: Secondary | ICD-10-CM | POA: Diagnosis not present

## 2021-10-05 DIAGNOSIS — N179 Acute kidney failure, unspecified: Secondary | ICD-10-CM | POA: Diagnosis not present

## 2021-10-05 DIAGNOSIS — E43 Unspecified severe protein-calorie malnutrition: Secondary | ICD-10-CM | POA: Insufficient documentation

## 2021-10-05 DIAGNOSIS — B9620 Unspecified Escherichia coli [E. coli] as the cause of diseases classified elsewhere: Secondary | ICD-10-CM

## 2021-10-05 LAB — RENAL FUNCTION PANEL
Albumin: 2.8 g/dL — ABNORMAL LOW (ref 3.5–5.0)
Anion gap: 9 (ref 5–15)
BUN: 79 mg/dL — ABNORMAL HIGH (ref 8–23)
CO2: 20 mmol/L — ABNORMAL LOW (ref 22–32)
Calcium: 8.1 mg/dL — ABNORMAL LOW (ref 8.9–10.3)
Chloride: 110 mmol/L (ref 98–111)
Creatinine, Ser: 3.1 mg/dL — ABNORMAL HIGH (ref 0.44–1.00)
GFR, Estimated: 13 mL/min — ABNORMAL LOW (ref >60)
Glucose, Bld: 97 mg/dL (ref 70–99)
Phosphorus: 3.5 mg/dL (ref 2.5–4.6)
Potassium: 4.1 mmol/L (ref 3.5–5.1)
Sodium: 139 mmol/L (ref 135–145)

## 2021-10-05 LAB — URINE CULTURE: Culture: >=100000 — AB

## 2021-10-05 LAB — CBC
HCT: 26.9 % — ABNORMAL LOW (ref 36.0–46.0)
Hemoglobin: 8.8 g/dL — ABNORMAL LOW (ref 12.0–15.0)
MCH: 31.5 pg (ref 26.0–34.0)
MCHC: 32.7 g/dL (ref 30.0–36.0)
MCV: 96.4 fL (ref 80.0–100.0)
Platelets: 208 10*3/uL (ref 150–400)
RBC: 2.79 MIL/uL — ABNORMAL LOW (ref 3.87–5.11)
RDW: 13.2 % (ref 11.5–15.5)
WBC: 7.6 10*3/uL (ref 4.0–10.5)
nRBC: 0 % (ref 0.0–0.2)

## 2021-10-05 LAB — MAGNESIUM: Magnesium: 2 mg/dL (ref 1.7–2.4)

## 2021-10-05 MED ORDER — LATANOPROST 0.005 % OP SOLN
1.0000 [drp] | Freq: Every day | OPHTHALMIC | Status: DC
  Administered 2021-10-05: 1 [drp] via OPHTHALMIC
  Filled 2021-10-05: qty 2.5

## 2021-10-05 MED ORDER — ADULT MULTIVITAMIN W/MINERALS CH
1.0000 | ORAL_TABLET | Freq: Every day | ORAL | Status: DC
  Administered 2021-10-05 – 2021-10-06 (×2): 1 via ORAL
  Filled 2021-10-05 (×2): qty 1

## 2021-10-05 MED ORDER — POLYVINYL ALCOHOL 1.4 % OP SOLN
1.0000 [drp] | OPHTHALMIC | Status: DC | PRN
  Filled 2021-10-05: qty 15

## 2021-10-05 MED ORDER — TIMOLOL MALEATE 0.5 % OP SOLN
1.0000 [drp] | Freq: Every day | OPHTHALMIC | Status: DC
  Administered 2021-10-05 – 2021-10-06 (×2): 1 [drp] via OPHTHALMIC
  Filled 2021-10-05: qty 5

## 2021-10-05 MED ORDER — NEPRO/CARBSTEADY PO LIQD
237.0000 mL | Freq: Two times a day (BID) | ORAL | Status: DC
  Administered 2021-10-05 – 2021-10-06 (×2): 237 mL via ORAL

## 2021-10-05 NOTE — Evaluation (Signed)
Physical Therapy Evaluation Patient Details Name: April Green MRN: [id number] DOB: January 05, 1923 Today's Date: 10/05/2021  History of Present Illness  Pt is a 87 y/o F with PMH:  HTN, anxiety, depression, insomnia and CKD came to ED s/p fall with fever, shaking and trembling, and admitted for AKI and possible urinary tract infection. Being tx for SIRS.  Clinical Impression  Patient alert, sitting in recliner with OT at bedside. Per OT and pt/family report, she lives alone and is independent/modI with ADLs, ambulates with rollator. Family assists with IADLs as needed and checks in on patient daily.   The patient demonstrated transfers and ambulation with supervision with RW. She did exhibit decreased safety awareness (unable to navigate RW around obstacles, but RW was pulling to the L; minA for assist but pt able to perform without). She did leave her RW to the side when returning to the room and to the chair, no LOB noted. Pt exhibits some decreased safety awareness as well as balance deficits, would benefit from further physical therapy to maximize safety and mobility. HHPT recommended at discharge. BERG to be performed next visit.        Recommendations for follow up therapy are one component of a multi-disciplinary discharge planning process, led by the attending physician.  Recommendations may be updated based on patient status, additional functional criteria and insurance authorization.  Follow Up Recommendations Home health PT    Assistance Recommended at Discharge Intermittent Supervision/Assistance  Functional Status Assessment Patient has had a recent decline in their functional status and demonstrates the ability to make significant improvements in function in a reasonable and predictable amount of time.  Equipment Recommendations  None recommended by PT    Recommendations for Other Services       Precautions / Restrictions Precautions Precautions: Fall Restrictions Weight  Bearing Restrictions: No      Mobility  Bed Mobility Overal bed mobility: Modified Independent             General bed mobility comments: pt up in chair at start of session    Transfers Overall transfer level: Needs assistance Equipment used: Rolling walker (2 wheels) Transfers: Sit to/from Stand Sit to Stand: Supervision           General transfer comment: does have decreased safety awareness    Ambulation/Gait Ambulation/Gait assistance: Supervision;Min assist Gait Distance (Feet): 120 Feet Assistive device: Rolling walker (2 wheels)         General Gait Details: minA just to assist with RW assist, pt had difficulty due to L pull of RW; did hit obstacles in hallway, and left RW off to the side when returning to the chair. no LOB noted  Stairs            Wheelchair Mobility    Modified Rankin (Stroke Patients Only)       Balance Overall balance assessment: Needs assistance Sitting-balance support: Feet supported Sitting balance-Leahy Scale: Normal       Standing balance-Leahy Scale: Good                               Pertinent Vitals/Pain Pain Assessment: No/denies pain    Home Living Family/patient expects to be discharged to:: Private residence Living Arrangements: Alone Available Help at Discharge: Family Type of Home: House Home Access: Stairs to enter Entrance Stairs-Rails: None Entrance Stairs-Number of Steps: 1   Home Layout: One level Home Equipment: Rollator (4 wheels)  Prior Function Prior Level of Function : Independent/Modified Independent;Needs assist       Physical Assist : ADLs (physical)   ADLs (physical): IADLs   ADLs Comments: family drives and runs errands, she has a PCA that helps with cleaning on Mondays     Hand Dominance        Extremity/Trunk Assessment   Upper Extremity Assessment Upper Extremity Assessment: Defer to OT evaluation    Lower Extremity Assessment Lower  Extremity Assessment: Overall WFL for tasks assessed    Cervical / Trunk Assessment Cervical / Trunk Assessment: Kyphotic  Communication   Communication: No difficulties  Cognition Arousal/Alertness: Awake/alert Behavior During Therapy: WFL for tasks assessed/performed Overall Cognitive Status: Within Functional Limits for tasks assessed                                          General Comments      Exercises Other Exercises Other Exercises: role of OT ed with pt and family   Assessment/Plan    PT Assessment Patient needs continued PT services  PT Problem List Decreased safety awareness       PT Treatment Interventions DME instruction;Therapeutic exercise;Gait training;Balance training;Stair training;Therapeutic activities;Neuromuscular re-education;Patient/family education;Functional mobility training    PT Goals (Current goals can be found in the Care Plan section)  Acute Rehab PT Goals Patient Stated Goal: to go home PT Goal Formulation: With patient Time For Goal Achievement: 10/19/21 Potential to Achieve Goals: Good Additional Goals Additional Goal #1: The patient will demonstrate a BERG sore of 50 or greater indicating a low fall risk to ensure safety at home.    Frequency Min 2X/week   Barriers to discharge        Co-evaluation               AM-PAC PT "6 Clicks" Mobility  Outcome Measure Help needed turning from your back to your side while in a flat bed without using bedrails?: None Help needed moving from lying on your back to sitting on the side of a flat bed without using bedrails?: None Help needed moving to and from a bed to a chair (including a wheelchair)?: None Help needed standing up from a chair using your arms (e.g., wheelchair or bedside chair)?: None Help needed to walk in hospital room?: A Little Help needed climbing 3-5 steps with a railing? : A Little 6 Click Score: 22    End of Session Equipment Utilized During  Treatment: Gait belt Activity Tolerance: Patient tolerated treatment well Patient left: in chair;with chair alarm set;with nursing/sitter in room;with call bell/phone within reach;with family/visitor present Nurse Communication: Mobility status PT Visit Diagnosis: Other abnormalities of gait and mobility (R26.89);Difficulty in walking, not elsewhere classified (R26.2);Muscle weakness (generalized) (M62.81)    Time: 5329-9242 PT Time Calculation (min) (ACUTE ONLY): 15 min   Charges:   PT Evaluation $PT Eval Low Complexity: 1 Low         Lieutenant Diego PT, DPT 10:18 AM,10/05/21

## 2021-10-05 NOTE — Progress Notes (Addendum)
Initial Nutrition Assessment  DOCUMENTATION CODES:   Underweight, Severe malnutrition in context of social or environmental circumstances  INTERVENTION:   -MVI with minerals daily -Nepro Shake po BID, each supplement provides 425 kcal and 19 grams protein  -Liberalize diet to regular  NUTRITION DIAGNOSIS:   Severe Malnutrition related to social / environmental circumstances as evidenced by severe fat depletion, severe muscle depletion.  GOAL:   Patient will meet greater than or equal to 90% of their needs  MONITOR:   PO intake, Supplement acceptance, Labs, Weight trends, Skin, I & O's  REASON FOR ASSESSMENT:   Malnutrition Screening Tool    ASSESSMENT:   April Green is a 87 y.o. female with medical history significant for hypertension, who presents emergency department from home for chief concerns of falling on Saturday and had a fever on 10/02/2021.  Pt admitted with sepsis.   Reviewed I/O's: +1.5 L x 24 hours and +2.4 L since admission   Spoke with pt and niece at bedside. Both reports that pt has had ongoing poor oral intake. Niece shares that eats very small quantities despite encouragement. She consumes 3 meals per day (Breakfast: cereal; Lunch: meat, and 3 vegetables; Dinner: sandwich). Per niece, pt intermittently consumes Nepro supplements at home (as this was recommended to her by her nephrologist) but often does not consume 2 per day that she was recommended.  Pt shares her UBW is around 80#. She explains that she has a distant history of weight loss and has been unable to regain weight back despite interventions. Niece reports they used to follow a strict renal diet, but now diet is liberalized secondary to weight loss and poor oral intake. Reviewed wt hx; wt has been stable over the past 6 months.   Discussed importance of good meal and supplement intake to promote healing. They are willing to resume Nepro and would like to stay on this supplement per  nephology's recommendation.   Medications reviewed and include lactated ringers infusion @ 75 ml/hr.   Labs reviewed.   NUTRITION - FOCUSED PHYSICAL EXAM:  Flowsheet Row Most Recent Value  Orbital Region Severe depletion  Upper Arm Region Severe depletion  Thoracic and Lumbar Region Severe depletion  Buccal Region Severe depletion  Temple Region Severe depletion  Clavicle Bone Region Severe depletion  Clavicle and Acromion Bone Region Severe depletion  Scapular Bone Region Severe depletion  Dorsal Hand Severe depletion  Patellar Region Severe depletion  Anterior Thigh Region Severe depletion  Posterior Calf Region Severe depletion  Edema (RD Assessment) None  Hair Reviewed  Eyes Reviewed  Mouth Reviewed  Skin Reviewed  Nails Reviewed       Diet Order:   Diet Order             Diet regular Room service appropriate? Yes; Fluid consistency: Thin  Diet effective now                   EDUCATION NEEDS:   Education needs have been addressed  Skin:  Skin Assessment: Reviewed RN Assessment  Last BM:  10/04/21  Height:   Ht Readings from Last 1 Encounters:  10/03/21 5\' 1"  (1.549 m)    Weight:   Wt Readings from Last 1 Encounters:  10/03/21 36.3 kg    Ideal Body Weight:  47.7 kg  BMI:  Body mass index is 15.12 kg/m.  Estimated Nutritional Needs:   Kcal:  1450-1650  Protein:  70-85 grams  Fluid:  > 1.4 L  Loistine Chance, RD, LDN, [place] Registered Dietitian II Certified Diabetes Care and Education Specialist Please refer to [facility] for RD and/or RD on-call/weekend/after hours pager

## 2021-10-05 NOTE — Plan of Care (Signed)
  Problem: Clinical Measurements: Goal: Ability to maintain clinical measurements within normal limits will improve Outcome: Progressing Goal: Will remain free from infection Outcome: Progressing   Problem: Activity: Goal: Risk for activity intolerance will decrease Outcome: Progressing   Problem: Safety: Goal: Ability to remain free from injury will improve Outcome: Progressing   

## 2021-10-05 NOTE — Evaluation (Signed)
Occupational Therapy Evaluation Patient Details Name: April Green MRN: [id number] DOB: 1923-07-24 Today's Date: 10/05/2021   History of Present Illness Pt is a 87 y/o F with PMH:  HTN, anxiety, depression, insomnia and CKD came to ED s/p fall with fever, shaking and trembling, and admitted for AKI and possible urinary tract infection. Being tx for SIRS.   Clinical Impression   Pt seen for OT evaluation this date in setting of acute hospitalization d/t SIRS. Pt presents with some mild confusion, but is appropriate with all command following. She is oriented to self, place and some aspects of situation. She reports living alone and states that she has aide assistance on Mondays for cleaning and grocery shopping. She has 3 family members including 2 nieces that also alternate checking on her QD. She presents this date with some mild balance deficits, some general deconditioning and some decreased safety awareness. She currently requires CGA to SUPV with ADL transfers and standing peri care, SETUP for seated UB/LB ADLs. Cues for safety awareness throughout session. Ed with pt and family re: safety with peri care for prevention of UTI/kidney or bladder infection and all parties with good understanding, but pt with poor carryover. Will continue to follow acutely. Anticipate pt will benefit from [facility] f/u.      Recommendations for follow up therapy are one component of a multi-disciplinary discharge planning process, led by the attending physician.  Recommendations may be updated based on patient status, additional functional criteria and insurance authorization.   Follow Up Recommendations  Home health OT    Assistance Recommended at Discharge Intermittent Supervision/Assistance  Functional Status Assessment  Patient has had a recent decline in their functional status and demonstrates the ability to make significant improvements in function in a reasonable and predictable amount of time.  Equipment  Recommendations  BSC/3in1;Tub/shower seat    Recommendations for Other Services       Precautions / Restrictions Precautions Precautions: Fall Restrictions Weight Bearing Restrictions: No      Mobility Bed Mobility Overal bed mobility: Modified Independent             General bed mobility comments: increased time    Transfers Overall transfer level: Needs assistance Equipment used: Rolling walker (2 wheels) Transfers: Sit to/from Stand Sit to Stand: Supervision;Min guard           General transfer comment: CGA with progress to SUPV, cues for safe use of 2WW as she uses rollator at home      Balance Overall balance assessment: Mild deficits observed, not formally tested                                         ADL either performed or assessed with clinical judgement   ADL Overall ADL's : Needs assistance/impaired                                       General ADL Comments: CGA to SUPV with ADL transfers and standing peri care, SETUP for seated UB/LB ADLs. Cues for safety awareness throughout session.     Vision Patient Visual Report: No change from baseline       Perception     Praxis      Pertinent Vitals/Pain Pain Assessment: No/denies pain     Hand Dominance  Extremity/Trunk Assessment Upper Extremity Assessment Upper Extremity Assessment: Generalized weakness;Overall [facility] for tasks assessed   Lower Extremity Assessment Lower Extremity Assessment: Generalized weakness;Difficult to assess due to impaired cognition       Communication Communication Communication: No difficulties   Cognition Arousal/Alertness: Awake/alert Behavior During Therapy: WFL for tasks assessed/performed Overall Cognitive Status: Within Functional Limits for tasks assessed                                       General Comments       Exercises Other Exercises Other Exercises: role of OT ed with pt and  family   Shoulder Instructions      Home Living Family/patient expects to be discharged to:: Private residence Living Arrangements: Alone Available Help at Discharge: Family Type of Home: House Home Access: Stairs to enter Technical brewer of Steps: 1 Entrance Stairs-Rails: None Home Layout: One level               [place]: Rollator (4 wheels)          Prior Functioning/Environment Prior Level of Function : Independent/Modified Independent;Needs assist       Physical Assist : ADLs (physical)   ADLs (physical): IADLs   ADLs Comments: family drives and runs errands, she has a PCA that helps with cleaning on Mondays        OT Problem List: Decreased strength;Decreased activity tolerance;Decreased safety awareness;Decreased knowledge of use of DME or AE      OT Treatment/Interventions: Self-care/ADL training;Therapeutic exercise;DME and/or AE instruction;Therapeutic activities;Patient/family education;Balance training    OT Goals(Current goals can be found in the care plan section) Acute Rehab OT Goals Patient Stated Goal: to go home OT Goal Formulation: With patient/family Time For Goal Achievement: 10/19/21 Potential to Achieve Goals: Good ADL Goals Pt Will Perform Upper Body Dressing: with modified independence Pt Will Perform Lower Body Dressing: with modified independence Pt Will Transfer to Toilet: with modified independence Pt Will Perform Toileting - Clothing Manipulation and hygiene: with modified independence  OT Frequency: Min 2X/week   Barriers to D/C:            Co-evaluation              AM-PAC OT "6 Clicks" Daily Activity     Outcome Measure Help from another person eating meals?: None Help from another person taking care of personal grooming?: A Little Help from another person toileting, which includes using toliet, bedpan, or urinal?: A Little Help from another person bathing (including washing, rinsing, drying)?: A  Little Help from another person to put on and taking off regular upper body clothing?: None Help from another person to put on and taking off regular lower body clothing?: A Little 6 Click Score: 20   End of Session Equipment Utilized During Treatment: Rolling walker (2 wheels) Nurse Communication: Mobility status  Activity Tolerance: Patient tolerated treatment well Patient left: in chair;with call bell/phone within reach;with chair alarm set;with family/visitor present  OT Visit Diagnosis: Unsteadiness on feet (R26.81);History of falling (Z91.81);Muscle weakness (generalized) (M62.81)                Time: 3664-4034 OT Time Calculation (min): 43 min Charges:  OT General Charges $OT Visit: 1 Visit OT Evaluation $OT Eval Moderate Complexity: 1 Mod OT Treatments $Self Care/Home Management : 8-22 mins $Therapeutic Activity: 8-22 mins  Gerrianne Scale, MS, OTR/L ascom [phone] 10/05/21, 10:07 AM

## 2021-10-05 NOTE — Progress Notes (Addendum)
PROGRESS NOTE  April Green DXI:[id number] DOB: 04-23-1923   PCP: [name], MD  Patient is from: Home.  Patient lives alone.  Family members close by.  Uses walker at baseline.  DOA: 10/03/2021 LOS: 1  Chief complaints:  Chief Complaint  Patient presents with   Fall   Weakness     Brief Narrative / Interim history: 87 year old F with PMH of HTN, anxiety, depression, insomnia and CKD came to ED with fever, shaking and trembling, and admitted for AKI and possible urinary tract infection.  Reportedly febrile to 103 the day prior to presentation.  She also had ground-level fall when she hit her head on carpeted floor 5 days prior.  Denies LOC, vision change or local neurodeficit.  In ED, afebrile.  Slightly hypotensive on arrival.  97% on RA.  Became tachycardic to 110s with tachypnea to upper 20s at some point.  Mild leukocytosis.  UA with pyuria.  Urine culture obtained.  Started on IV fluid and IV ceftriaxone.   Urine culture with E. coli.  AKI improving with IV fluid hydration.  Subjective: Seen and examined earlier this morning.  Patient nieces at bedside, and reports some confusion last night and earlier this morning.  She is currently working with therapy.  No other complaints.  She denies chest pain, dyspnea, GI or UTI symptoms.  She is alert and oriented x4.  Objective: Vitals:   10/04/21 1900 10/04/21 2111 10/05/21 0436 10/05/21 0805  BP: (!) 130/58 (!) 131/58 127/60 (!) 130/56  Pulse: 82 83 85 81  Resp: 19 16 20 18   Temp: 98.2 F (36.8 C) 98.6 F (37 C) 98.4 F (36.9 C) 98 F (36.7 C)  TempSrc: Oral Oral Oral Oral  SpO2: 93% 95% 92% 91%  Weight:      Height:        Intake/Output Summary (Last 24 hours) at 10/05/2021 1342 Last data filed at 10/05/2021 0427 Gross per 24 hour  Intake 1489.42 ml  Output --  Net 1489.42 ml   Filed Weights   10/03/21 1145  Weight: 36.3 kg    Examination:  GENERAL: Frail looking elderly female.  No apparent  distress. HEENT: MMM.  Vision and hearing grossly intact.  NECK: Supple.  No apparent JVD.  RESP: 91% on RA.  No IWOB.  Fair aeration bilaterally. CVS:  RRR. Heart sounds normal.  ABD/GI/GU: BS+. Abd soft, NTND.  MSK/EXT:  Moves extremities.  Significant muscle mass and subcu fat loss. SKIN: no apparent skin lesion or wound NEURO: Awake and alert. Oriented x4.  No apparent focal neuro deficit. PSYCH: Calm. Normal affect.   Procedures:  None  Microbiology summarized: JQBHA-19 and influenza PCR nonreactive. Urine culture with pansensitive E. coli. Blood culture NGTD  Assessment & Plan: Sepsis due to E. coli UTI: Reportedly febrile to 103 two days prior to presentation.  She has leukocytosis.  UA with pyuria.  Pro-Cal markedly elevated.  Urine culture with E. coli.  Blood cultures NGTD. -Continue IV ceftriaxone given poor renal function  AKI/azotemia on CKD-4: Likely prerenal from poor p.o. intake.  She was also on low-dose torsemide which can contribute.  She denies NSAID use.  CK slightly elevated to 372 likely traumatic from recent fall. Recent Labs    04/10/21 1524 10/03/21 1142 10/04/21 0655 10/05/21 0739  BUN 55* 100* 92* 79*  CREATININE 1.99* 4.18* 3.70* 3.10*  -Continue gentle IV fluid. -Continue holding home diuretics. -Monitor urine output -Intermittent bladder scan as needed -Consider renal US and nephrology  consult if no improvement  Accidental fall at home-CT head and cervical spine without acute finding.  No focal neurodeficit.  No injuries. Traumatic rhabdomyolysis: Mild CK elevation -IV fluid as above -Fall precaution -PT/OT eval  Normocytic anemia: Drop in Hgb likely dilutional from IV fluid. Recent Labs    04/10/21 1524 10/03/21 1142 10/04/21 0655 10/05/21 0739  HGB 9.7* 9.7* 9.5* 8.8*  -Continue monitoring -Check anemia panel in the morning    Hypotension/history of hypertension: Hypotension resolved. -Continue gentle IV fluid -Continue  holding amlodipine and torsemide   Anxiety/depression/insomnia: Stable. -Continue home medications  Leukocytosis: Resolved.  Physical deconditioning: Very independent at baseline. -PT/OT  Goal of care counseling: Patient's nieces had questions about CODE STATUS and DNR and DNI.  After extensive discussion about the pros and cons of CPR or intubation, patient and patient's nieces felt DNR/DNI is appropriate.   Underweight/severe malnutrition Body mass index is 15.12 kg/m. Nutrition Problem: Severe Malnutrition Etiology: social / environmental circumstances Signs/Symptoms: severe fat depletion, severe muscle depletion Interventions: MVI, Nepro shake   DVT prophylaxis:  heparin injection 5,000 Units Start: 10/04/21 0600 Place TED hose Start: 10/03/21 1829  Code Status: DNR/DNI Family Communication: Updated patient's nieces at bedside. Level of care: Med-Surg Status is: Inpatient  The patient will remain inpatient because: Need for IV antibiotics for sepsis due to UTI and IV fluid due to AKI      Consultants:  None   Sch Meds:  Scheduled Meds:  busPIRone  7.5 mg Oral BID   feeding supplement (NEPRO CARB STEADY)  237 mL Oral BID BM   heparin  5,000 Units Subcutaneous Q8H   latanoprost  1 drop Both Eyes QHS   LORazepam  0.5 mg Oral QHS   melatonin  5 mg Oral QHS   multivitamin with minerals  1 tablet Oral Daily   timolol  1 drop Both Eyes Daily   Continuous Infusions:  cefTRIAXone (ROCEPHIN)  IV 1 g (10/05/21 1018)   lactated ringers 75 mL/hr at 10/05/21 0427   PRN Meds:.acetaminophen **OR** acetaminophen, acetaminophen, ondansetron **OR** ondansetron (ZOFRAN) IV, polyvinyl alcohol  Antimicrobials: Anti-infectives (From admission, onward)    Start     Dose/Rate Route Frequency Ordered Stop   10/04/21 1000  cefTRIAXone (ROCEPHIN) 2 g in sodium chloride 0.9 % 100 mL IVPB  Status:  Discontinued        2 g 200 mL/hr over 30 Minutes Intravenous Every 24 hours  10/03/21 1830 10/04/21 0744   10/04/21 1000  cefTRIAXone (ROCEPHIN) 1 g in sodium chloride 0.9 % 100 mL IVPB        1 g 200 mL/hr over 30 Minutes Intravenous Every 24 hours 10/04/21 0748 10/08/21 0959   10/03/21 1630  cefTRIAXone (ROCEPHIN) 1 g in sodium chloride 0.9 % 100 mL IVPB        1 g 200 mL/hr over 30 Minutes Intravenous  Once 10/03/21 1619 10/03/21 1712        I have personally reviewed the following labs and images: CBC: Recent Labs  Lab 10/03/21 1142 10/04/21 0655 10/05/21 0739  WBC 13.1* 8.7 7.6  NEUTROABS  --  7.1  --   HGB 9.7* 9.5* 8.8*  HCT 29.1* 29.2* 26.9*  MCV 98.6 97.0 96.4  PLT 211 201 208   BMP &GFR Recent Labs  Lab 10/03/21 1142 10/03/21 1840 10/04/21 0655 10/05/21 0739  NA 138  --  139 139  K 4.3  --  4.3 4.1  CL 105  --  107 110  CO2 21*  --  21* 20*  GLUCOSE 129*  --  103* 97  BUN 100*  --  92* 79*  CREATININE 4.18*  --  3.70* 3.10*  CALCIUM 8.6*  --  8.2* 8.1*  MG  --  2.3  --  2.0  PHOS  --  5.2*  --  3.5   Estimated Creatinine Clearance: 6.1 mL/min (A) (by C-G formula based on SCr of 3.1 mg/dL (H)). Liver & Pancreas: Recent Labs  Lab 10/05/21 0739  ALBUMIN 2.8*   No results for input(s): LIPASE, AMYLASE in the last 168 hours. No results for input(s): AMMONIA in the last 168 hours. Diabetic: No results for input(s): HGBA1C in the last 72 hours. No results for input(s): GLUCAP in the last 168 hours. Cardiac Enzymes: Recent Labs  Lab 10/04/21 0655  CKTOTAL 372*   No results for input(s): PROBNP in the last 8760 hours. Coagulation Profile: Recent Labs  Lab 10/04/21 0655  INR 1.1   Thyroid Function Tests: No results for input(s): TSH, T4TOTAL, FREET4, T3FREE, THYROIDAB in the last 72 hours. Lipid Profile: No results for input(s): CHOL, HDL, LDLCALC, TRIG, CHOLHDL, LDLDIRECT in the last 72 hours. Anemia Panel: No results for input(s): VITAMINB12, FOLATE, FERRITIN, TIBC, IRON, RETICCTPCT in the last 72 hours. Urine  analysis:    Component Value Date/Time   COLORURINE YELLOW 10/03/2021 1142   APPEARANCEUR CLEAR 10/03/2021 1142   APPEARANCEUR Cloudy (A) 04/10/2021 1005   LABSPEC 1.020 10/03/2021 1142   LABSPEC 1.004 04/13/2012 1932   PHURINE 5.5 10/03/2021 1142   GLUCOSEU NEGATIVE 10/03/2021 1142   GLUCOSEU Negative 04/13/2012 1932   HGBUR SMALL (A) 10/03/2021 1142   BILIRUBINUR NEGATIVE 10/03/2021 1142   BILIRUBINUR Negative 04/10/2021 1005   BILIRUBINUR Negative 04/13/2012 1932   KETONESUR NEGATIVE 10/03/2021 1142   PROTEINUR 100 (A) 10/03/2021 1142   NITRITE NEGATIVE 10/03/2021 1142   LEUKOCYTESUR LARGE (A) 10/03/2021 1142   LEUKOCYTESUR 3+ 04/13/2012 1932   Sepsis Labs: Invalid input(s): PROCALCITONIN, [place]  Microbiology: Recent Results (from the past 240 hour(s))  Urine Culture     Status: Abnormal   Collection Time: 10/03/21 11:42 AM   Specimen: Urine, Random  Result Value Ref Range Status   Specimen Description   Final    URINE, RANDOM Performed at [facility], [address], [place], [place] [address]    Special Requests   Final    NONE Performed at [facility], [address] [place], [place] [address]    Culture >=100,000 COLONIES/mL ESCHERICHIA COLI (A)  Final   Report Status 10/05/2021 FINAL  Final   Organism ID, Bacteria ESCHERICHIA COLI (A)  Final      Susceptibility   Escherichia coli - MIC*    AMPICILLIN <=2 SENSITIVE Sensitive     CEFAZOLIN <=4 SENSITIVE Sensitive     CEFEPIME <=0.12 SENSITIVE Sensitive     CEFTRIAXONE <=0.25 SENSITIVE Sensitive     CIPROFLOXACIN <=0.25 SENSITIVE Sensitive     GENTAMICIN <=1 SENSITIVE Sensitive     IMIPENEM <=0.25 SENSITIVE Sensitive     NITROFURANTOIN <=16 SENSITIVE Sensitive     TRIMETH/SULFA <=20 SENSITIVE Sensitive     AMPICILLIN/SULBACTAM <=2 SENSITIVE Sensitive     PIP/TAZO <=4 SENSITIVE Sensitive     * >=100,000 COLONIES/mL ESCHERICHIA COLI  Resp Panel by RT-PCR (Flu A&B, Covid)  Nasopharyngeal Swab     Status: None   Collection Time: 10/03/21  4:36 PM   Specimen: Nasopharyngeal Swab; Nasopharyngeal(NP) swabs in vial transport medium  Result Value Ref Range Status   SARS Coronavirus 2 by RT PCR NEGATIVE NEGATIVE Final    Comment: (NOTE) SARS-CoV-2 target nucleic acids are NOT DETECTED.  The SARS-CoV-2 RNA is generally detectable in upper respiratory specimens during the acute phase of infection. The lowest concentration of SARS-CoV-2 viral copies this assay can detect is 138 copies/mL. A negative result does not preclude SARS-Cov-2 infection and should not be used as the sole basis for treatment or other patient management decisions. A negative result may occur with  improper specimen collection/handling, submission of specimen other than nasopharyngeal swab, presence of viral mutation(s) within the areas targeted by this assay, and inadequate number of viral copies(<138 copies/mL). A negative result must be combined with clinical observations, patient history, and epidemiological information. The expected result is Negative.  Fact Sheet for Patients:  EntrepreneurPulse.com.au  Fact Sheet for Healthcare Providers:  IncredibleEmployment.be  This test is no t yet approved or cleared by the [place] FDA and  has been authorized for detection and/or diagnosis of SARS-CoV-2 by FDA under an Emergency Use Authorization (EUA). This EUA will remain  in effect (meaning this test can be used) for the duration of the COVID-19 declaration under Section 564(b)(1) of the Act, 21 U.S.C.section 360bbb-3(b)(1), unless the authorization is terminated  or revoked sooner.       Influenza A by PCR NEGATIVE NEGATIVE Final   Influenza B by PCR NEGATIVE NEGATIVE Final    Comment: (NOTE) The Xpert Xpress SARS-CoV-2/FLU/RSV plus assay is intended as an aid in the diagnosis of influenza from Nasopharyngeal swab specimens and should not be  used as a sole basis for treatment. Nasal washings and aspirates are unacceptable for Xpert Xpress SARS-CoV-2/FLU/RSV testing.  Fact Sheet for Patients: EntrepreneurPulse.com.au  Fact Sheet for Healthcare Providers: IncredibleEmployment.be  This test is not yet approved or cleared by the [place] FDA and has been authorized for detection and/or diagnosis of SARS-CoV-2 by FDA under an Emergency Use Authorization (EUA). This EUA will remain in effect (meaning this test can be used) for the duration of the COVID-19 declaration under Section 564(b)(1) of the Act, 21 U.S.C. section 360bbb-3(b)(1), unless the authorization is terminated or revoked.  Performed at [facility], [address] [place], [place] [address]   CULTURE, BLOOD (ROUTINE X 2) w Reflex to ID Panel     Status: None (Preliminary result)   Collection Time: 10/04/21  8:08 AM   Specimen: BLOOD  Result Value Ref Range Status   Specimen Description BLOOD BLOOD RIGHT HAND  Final   Special Requests   Final    BOTTLES DRAWN AEROBIC AND ANAEROBIC Blood Culture adequate volume   Culture   Final    NO GROWTH < 24 HOURS Performed at [facility], [address], [place], [place] [address]    Report Status PENDING  Incomplete  CULTURE, BLOOD (ROUTINE X 2) w Reflex to ID Panel     Status: None (Preliminary result)   Collection Time: 10/04/21  8:08 AM   Specimen: BLOOD  Result Value Ref Range Status   Specimen Description BLOOD LEFT ANTECUBITAL  Final   Special Requests   Final    BOTTLES DRAWN AEROBIC AND ANAEROBIC Blood Culture adequate volume   Culture   Final    NO GROWTH < 24 HOURS Performed at [facility], [address], [place], [place] [address]    Report Status PENDING  Incomplete    Radiology Studies: No results found.    Bretta Bang  Bettina G[facility]  If 7PM-7AM, please contact night-coverage [email or web address] 10/05/2021, 1:42 PM

## 2021-10-06 DIAGNOSIS — Z9189 Other specified personal risk factors, not elsewhere classified: Secondary | ICD-10-CM

## 2021-10-06 DIAGNOSIS — N390 Urinary tract infection, site not specified: Secondary | ICD-10-CM | POA: Diagnosis not present

## 2021-10-06 DIAGNOSIS — N179 Acute kidney failure, unspecified: Secondary | ICD-10-CM | POA: Diagnosis not present

## 2021-10-06 DIAGNOSIS — A419 Sepsis, unspecified organism: Secondary | ICD-10-CM | POA: Diagnosis not present

## 2021-10-06 DIAGNOSIS — E43 Unspecified severe protein-calorie malnutrition: Secondary | ICD-10-CM

## 2021-10-06 DIAGNOSIS — R5381 Other malaise: Secondary | ICD-10-CM

## 2021-10-06 DIAGNOSIS — A4150 Gram-negative sepsis, unspecified: Secondary | ICD-10-CM | POA: Diagnosis not present

## 2021-10-06 LAB — CBC
HCT: 26.3 % — ABNORMAL LOW (ref 36.0–46.0)
Hemoglobin: 8.8 g/dL — ABNORMAL LOW (ref 12.0–15.0)
MCH: 32.2 pg (ref 26.0–34.0)
MCHC: 33.5 g/dL (ref 30.0–36.0)
MCV: 96.3 fL (ref 80.0–100.0)
Platelets: 219 10*3/uL (ref 150–400)
RBC: 2.73 MIL/uL — ABNORMAL LOW (ref 3.87–5.11)
RDW: 13.2 % (ref 11.5–15.5)
WBC: 5.7 10*3/uL (ref 4.0–10.5)
nRBC: 0 % (ref 0.0–0.2)

## 2021-10-06 LAB — RENAL FUNCTION PANEL
Albumin: 2.8 g/dL — ABNORMAL LOW (ref 3.5–5.0)
Anion gap: 12 (ref 5–15)
BUN: 68 mg/dL — ABNORMAL HIGH (ref 8–23)
CO2: 20 mmol/L — ABNORMAL LOW (ref 22–32)
Calcium: 8.2 mg/dL — ABNORMAL LOW (ref 8.9–10.3)
Chloride: 109 mmol/L (ref 98–111)
Creatinine, Ser: 2.62 mg/dL — ABNORMAL HIGH (ref 0.44–1.00)
GFR, Estimated: 16 mL/min — ABNORMAL LOW (ref >60)
Glucose, Bld: 105 mg/dL — ABNORMAL HIGH (ref 70–99)
Phosphorus: 3.1 mg/dL (ref 2.5–4.6)
Potassium: 3.8 mmol/L (ref 3.5–5.1)
Sodium: 141 mmol/L (ref 135–145)

## 2021-10-06 LAB — RETICULOCYTES
Immature Retic Fract: 3.5 % (ref 2.3–15.9)
RBC.: 2.76 MIL/uL — ABNORMAL LOW (ref 3.87–5.11)
Retic Count, Absolute: 24 10*3/uL (ref 19.0–186.0)
Retic Ct Pct: 0.9 % (ref 0.4–3.1)

## 2021-10-06 LAB — MAGNESIUM: Magnesium: 1.9 mg/dL (ref 1.7–2.4)

## 2021-10-06 LAB — VITAMIN B12: Vitamin B-12: 985 pg/mL — ABNORMAL HIGH (ref 180–914)

## 2021-10-06 LAB — IRON AND TIBC
Iron: 22 ug/dL — ABNORMAL LOW (ref 28–170)
Saturation Ratios: 9 % — ABNORMAL LOW (ref 10.4–31.8)
TIBC: 234 ug/dL — ABNORMAL LOW (ref 250–450)
UIBC: 212 ug/dL

## 2021-10-06 LAB — FOLATE: Folate: 42 ng/mL (ref >5.9)

## 2021-10-06 LAB — FERRITIN: Ferritin: 76 ng/mL (ref 11–307)

## 2021-10-06 LAB — CK: Total CK: 109 U/L (ref 38–234)

## 2021-10-06 MED ORDER — TORSEMIDE 10 MG PO TABS: 10.0000 mg | ORAL_TABLET | ORAL | Status: AC

## 2021-10-06 NOTE — TOC CM/SW Note (Addendum)
Patient was discharged before CSW could meet with her at bedside to discuss [facility]. CSW attempted calls to patient/niece, no answer. Left a VM requesting return call to discuss [place] options. No call back has been received yet.  MD ordered [facility].  Referral made to [facility] for now to make sure patient has the services she needs. Will change if I receive a return call with a different agency preference.  1:00- Call from patient's niece who stated she is agreeable to [facility]. Explained agency options. She is agreeable to [facility] and is aware they will reach out for scheduling.   Oleh Genin, [place]

## 2021-10-06 NOTE — Discharge Summary (Signed)
Physician Discharge Summary  April Green:[id number] DOB: [birth year] DOA: 10/03/2021  PCP: April Pink, MD  Admit date: 10/03/2021 Discharge date: 10/06/2021 Admitted From: Home Disposition: Home Recommendations for Outpatient Follow-up:  Follow ups as below. Please obtain CBC/BMP/Mag at follow up Please follow up on the following pending results: None Home Health: PT/OT Equipment/Devices: None Discharge Condition: Stable CODE STATUS: DNR  Follow-up Information     April Pink, MD. Schedule an appointment as soon as possible for a visit in 1 week(s).   Specialty: Family Medicine Contact information: [address] [place] [address] [phone]                Hospital Course: 87 year old F with PMH of HTN, anxiety, depression, insomnia and CKD came to ED with fever, shaking and trembling, and admitted for AKI and possible urinary tract infection.  Reportedly febrile to 103 the day prior to presentation.  She also had ground-level fall when she hit her head on carpeted floor 5 days prior.  Denies LOC, vision change or local neurodeficit.   In ED, afebrile.  Slightly hypotensive on arrival.  97% on RA.  Became tachycardic to 110s with tachypnea to upper 20s at some point.  Mild leukocytosis.  UA with pyuria.  Urine culture obtained.  Started on IV fluid and IV ceftriaxone.    Urine culture with pansensitive E. coli.  She completed antibiotic course with IV ceftriaxone for 4 days.  AKI improved with IV fluid hydration and holding torsemide.  Discharged to follow-up with PCP and nephrologist in 1 to 2 weeks.  Advised to hold her torsemide until follow-up.  Home health PT/OT ordered as recommended by therapy.  See individual problem list below for more on hospital course.  Discharge Diagnoses:  Sepsis due to E. coli UTI: Reportedly febrile to 103 two days prior to presentation.  She has leukocytosis.  UA with pyuria.  Pro-Cal markedly  elevated.  Urine culture with pansensitive E. coli.  Blood cultures NGTD. -Completed 4 days of IV ceftriaxone in-house.   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 intake.  She was also on low-dose torsemide which can contribute.  She denies NSAID use.  CK slightly elevated to 372 likely traumatic from recent fall. Recent Labs    04/10/21 1524 10/03/21 1142 10/04/21 0655 10/05/21 0739 10/06/21 0632  BUN 55* 100* 92* 79* 68*  CREATININE 1.99* 4.18* 3.70* 3.10* 2.62*  -Advised to hold torsemide until follow-up   Accidental fall at home-CT head and cervical spine without acute finding.  No focal neurodeficit.  No injuries. Traumatic rhabdomyolysis: Mild CK elevation -HH PT/OT ordered   Normocytic anemia: Drop in Hgb likely dilutional from IV fluid.  No melena or hematochezia.  Anemia panel with mild iron deficiency Recent Labs    04/10/21 1524 10/03/21 1142 10/04/21 0655 10/05/21 0739 10/06/21 0632  HGB 9.7* 9.7* 9.5* 8.8* 8.8*  -Recheck CBC at follow-up -May consider p.o. iron with bowel regimen outpatient     Hypotension/history of hypertension: Hypotension resolved.  Now hypotensive.  Likely from fluid and holding home antihypertensive meds. -Resumed home amlodipine on discharge -Holding torsemide in the setting of AKI   Anxiety/depression/insomnia: Stable. -Continue home medications   Leukocytosis: Resolved.   Physical deconditioning: Very independent at baseline. -HH PT/OT.  At risk for polypharmacy-on meclizine, Ativan and melatonin.  She is 87 years of age. -Discontinued meclizine   Goal of care counseling: Patient's nieces had questions about CODE STATUS and DNR  and DNI.  After extensive discussion about the pros and cons of CPR or intubation, patient and patient's nieces felt DNR/DNI is appropriate.    Underweight/severe malnutrition Body mass index is 15.12 kg/m. Nutrition Problem: Severe Malnutrition Etiology: social / environmental  circumstances Signs/Symptoms: severe fat depletion, severe muscle depletion Interventions: MVI, Nepro shake     Discharge Exam: Vitals:   10/05/21 1504 10/05/21 1940 10/06/21 0354 10/06/21 0911  BP: (!) 148/59 (!) 146/61 (!) 155/64 (!) 170/64  Pulse: 85 89 92 68  Temp:  98.2 F (36.8 C) (!) 97.4 F (36.3 C) 98 F (36.7 C)  Resp: 18 16 16 14   Height:      Weight:      SpO2: 95% 99% 94% 96%  TempSrc:  Oral Oral   BMI (Calculated):         GENERAL: Frail looking elderly female.  No apparent distress. HEENT: MMM.  Vision grossly intact.  Hard of hearing. NECK: Supple.  No apparent JVD.  RESP: 96% on RA.  No IWOB.  Fair aeration bilaterally. CVS:  RRR. Heart sounds normal.  ABD/GI/GU: Bowel sounds present. Soft. Non tender.  MSK/EXT:  Moves extremities. No apparent deformity.  Trace BLE edema. SKIN: no apparent skin lesion or wound NEURO: Awake and alert.  Oriented appropriately.  No apparent focal neuro deficit. PSYCH: Calm. Normal affect.   Discharge Instructions  Discharge Instructions     Call MD for:  difficulty breathing, headache or visual disturbances   Complete by: As directed    Call MD for:  extreme fatigue   Complete by: As directed    Call MD for:  persistant dizziness or light-headedness   Complete by: As directed    Call MD for:  persistant nausea and vomiting   Complete by: As directed    Call MD for:  severe uncontrolled pain   Complete by: As directed    Call MD for:  temperature >100.4   Complete by: As directed    Diet general   Complete by: As directed    Discharge instructions   Complete by: As directed    It has been a pleasure taking care of you!  You were hospitalized with acute kidney injury and possible urinary tract infection.  You have been treated with antibiotics for urinary tract infection.  Your kidney function improved with intravenous fluid hydration.  Note that we have stopped your torsemide for the next 2 weeks until you have  your kidney numbers rechecked.  Keep yourself hydrated.  Please follow-up with your primary care doctor and nephrologist in 1 to 2 weeks.  Combination of meclizine, lorazepam and melatonin could increase your risk of fall.  We have started meclizine.  You may discuss about lorazepam and melatonin with your primary care doctor.  Avoid any over-the-counter pain medication other than plain Tylenol.  Continue wearing your compression socks during the daytime.  Also recommend elevating your legs for swelling.   Take care,   Increase activity slowly   Complete by: As directed       Allergies as of 10/06/2021       Reactions   Sulfa Antibiotics Anaphylaxis   Amoxicillin-pot Clavulanate Diarrhea, Nausea Only   Stomach pains   Azithromycin Nausea Only, Other (See Comments)   Confusion also   Diphenhydramine Other (See Comments)   Confusion   Trazodone And Nefazodone Other (See Comments)   Disorientation and sleep deprivation   Lokelma [sodium Zirconium Cyclosilicate] Other (See Comments)   edema  5-methoxypsoralen Other (See Comments)   Altered mental status Used to treat psoriasis and/or vitiligo   Aspirin Other (See Comments)   EARS START TO RING   Ceftriaxone Nausea Only   Flexeril [cyclobenzaprine] Nausea Only   Levofloxacin Other (See Comments)   Causes pain in calf of leg when walking Levaquin   Mirtazapine Other (See Comments)   Dizzy   Oxycodone Other (See Comments)   Causes heavy drowsiness        Medication List     STOP taking these medications    meclizine 12.5 MG tablet Commonly known as: ANTIVERT       TAKE these medications    acetaminophen 325 MG tablet Commonly known as: TYLENOL Take 325-650 mg by mouth every 6 (six) hours as needed for moderate pain.   amLODipine 10 MG tablet Commonly known as: NORVASC Take 10 mg by mouth at bedtime.   busPIRone 7.5 MG tablet Commonly known as: BUSPAR Take 7.5 mg by mouth 2 (two) times daily.    estradiol 0.1 MG/GM vaginal cream Commonly known as: ESTRACE VAGINAL Apply 0.5mg  (pea-sized amount)  just inside the vaginal introitus with a finger-tip every night for two weeks and then Monday, Wednesday and Friday nights.   latanoprost 0.005 % ophthalmic solution Commonly known as: XALATAN Place 1 drop into both eyes at bedtime.   LORazepam 0.5 MG tablet Commonly known as: ATIVAN Take 0.5 mg by mouth at bedtime.   Melatonin 5 MG Caps Take 5 mg by mouth at bedtime.   PreserVision AREDS 2+Multi Vit Caps Take 1 capsule by mouth 2 (two) times daily.   torsemide 10 MG tablet Commonly known as: DEMADEX Take 1 tablet (10 mg total) by mouth every other day. Start taking on: October 20, 2021 What changed: These instructions start on October 20, 2021. If you are unsure what to do until then, ask your doctor or other care provider.        Consultations: None  Procedures/Studies:   CT HEAD WO CONTRAST (5MM)  Result Date: 10/03/2021 CLINICAL DATA:  87 year old female with history of head and neck trauma after a fall on Saturday. EXAM: CT HEAD WITHOUT CONTRAST CT CERVICAL SPINE WITHOUT CONTRAST TECHNIQUE: Multidetector CT imaging of the head and cervical spine was performed following the standard protocol without intravenous contrast. Multiplanar CT image reconstructions of the cervical spine were also generated. COMPARISON:  CT the head and cervical spine 09/08/2018. FINDINGS: CT HEAD FINDINGS Brain: Moderate cerebral and cerebellar atrophy. Patchy and confluent areas of decreased attenuation are noted throughout the deep and periventricular white matter of the cerebral hemispheres bilaterally, compatible with chronic microvascular ischemic disease. Faint physiologic calcifications are noted in the basal ganglia bilaterally (left greater than right). No evidence of acute infarction, hemorrhage, hydrocephalus, extra-axial collection or mass lesion/mass effect. Vascular: No hyperdense  vessel or unexpected calcification. Skull: Normal. Negative for fracture or focal lesion. Sinuses/Orbits: No acute finding. Other: None. CT CERVICAL SPINE FINDINGS Alignment: Normal. Skull base and vertebrae: No acute fracture. No primary bone lesion or focal pathologic process. Soft tissues and spinal canal: No prevertebral fluid or swelling. No visible canal hematoma. Disc levels: Multilevel degenerative disc disease, most pronounced at C5-C6. Mild multilevel facet arthropathy. Upper chest: Negative. Other: None. IMPRESSION: 1. No evidence of significant acute traumatic injury to the skull, brain or cervical spine. 2. Moderate cerebral and cerebellar atrophy with extensive chronic microvascular ischemic changes in the cerebral white matter, as above. 3. Multilevel degenerative disc disease and cervical spondylosis, as  above. Electronically Signed   By: Vinnie Langton M.D.   On: 10/03/2021 17:57   CT Cervical Spine Wo Contrast  Result Date: 10/03/2021 CLINICAL DATA:  87 year old female with history of head and neck trauma after a fall on Saturday. EXAM: CT HEAD WITHOUT CONTRAST CT CERVICAL SPINE WITHOUT CONTRAST TECHNIQUE: Multidetector CT imaging of the head and cervical spine was performed following the standard protocol without intravenous contrast. Multiplanar CT image reconstructions of the cervical spine were also generated. COMPARISON:  CT the head and cervical spine 09/08/2018. FINDINGS: CT HEAD FINDINGS Brain: Moderate cerebral and cerebellar atrophy. Patchy and confluent areas of decreased attenuation are noted throughout the deep and periventricular white matter of the cerebral hemispheres bilaterally, compatible with chronic microvascular ischemic disease. Faint physiologic calcifications are noted in the basal ganglia bilaterally (left greater than right). No evidence of acute infarction, hemorrhage, hydrocephalus, extra-axial collection or mass lesion/mass effect. Vascular: No hyperdense vessel  or unexpected calcification. Skull: Normal. Negative for fracture or focal lesion. Sinuses/Orbits: No acute finding. Other: None. CT CERVICAL SPINE FINDINGS Alignment: Normal. Skull base and vertebrae: No acute fracture. No primary bone lesion or focal pathologic process. Soft tissues and spinal canal: No prevertebral fluid or swelling. No visible canal hematoma. Disc levels: Multilevel degenerative disc disease, most pronounced at C5-C6. Mild multilevel facet arthropathy. Upper chest: Negative. Other: None. IMPRESSION: 1. No evidence of significant acute traumatic injury to the skull, brain or cervical spine. 2. Moderate cerebral and cerebellar atrophy with extensive chronic microvascular ischemic changes in the cerebral white matter, as above. 3. Multilevel degenerative disc disease and cervical spondylosis, as above. Electronically Signed   By: Vinnie Langton M.D.   On: 10/03/2021 17:57   DG Chest Port 1 View  Result Date: 10/03/2021 CLINICAL DATA:  [place] on Saturday EXAM: PORTABLE CHEST 1 VIEW COMPARISON:  09/26/2005 FINDINGS: No focal opacity, pleural effusion, or pneumothorax. Borderline to mild cardiomegaly. Aortic atherosclerosis. IMPRESSION: No active disease. Electronically Signed   By: Donavan Foil M.D.   On: 10/03/2021 17:23       The results of significant diagnostics from this hospitalization (including imaging, microbiology, ancillary and laboratory) are listed below for reference.     Microbiology: Recent Results (from the past 240 hour(s))  Urine Culture     Status: Abnormal   Collection Time: 10/03/21 11:42 AM   Specimen: Urine, Random  Result Value Ref Range Status   Specimen Description   Final    URINE, RANDOM Performed at [facility], [address] [place], [place] [address]    Special Requests   Final    NONE Performed at [facility], [address] [place], [place] [address]    Culture >=100,000 COLONIES/mL ESCHERICHIA COLI (A)  Final    Report Status 10/05/2021 FINAL  Final   Organism ID, Bacteria ESCHERICHIA COLI (A)  Final      Susceptibility   Escherichia coli - MIC*    AMPICILLIN <=2 SENSITIVE Sensitive     CEFAZOLIN <=4 SENSITIVE Sensitive     CEFEPIME <=0.12 SENSITIVE Sensitive     CEFTRIAXONE <=0.25 SENSITIVE Sensitive     CIPROFLOXACIN <=0.25 SENSITIVE Sensitive     GENTAMICIN <=1 SENSITIVE Sensitive     IMIPENEM <=0.25 SENSITIVE Sensitive     NITROFURANTOIN <=16 SENSITIVE Sensitive     TRIMETH/SULFA <=20 SENSITIVE Sensitive     AMPICILLIN/SULBACTAM <=2 SENSITIVE Sensitive     PIP/TAZO <=4 SENSITIVE Sensitive     * >=100,000 COLONIES/mL ESCHERICHIA COLI  Resp  Panel by RT-PCR (Flu A&B, Covid) Nasopharyngeal Swab     Status: None   Collection Time: 10/03/21  4:36 PM   Specimen: Nasopharyngeal Swab; Nasopharyngeal(NP) swabs in vial transport medium  Result Value Ref Range Status   SARS Coronavirus 2 by RT PCR NEGATIVE NEGATIVE Final    Comment: (NOTE) SARS-CoV-2 target nucleic acids are NOT DETECTED.  The SARS-CoV-2 RNA is generally detectable in upper respiratory specimens during the acute phase of infection. The lowest concentration of SARS-CoV-2 viral copies this assay can detect is 138 copies/mL. A negative result does not preclude SARS-Cov-2 infection and should not be used as the sole basis for treatment or other patient management decisions. A negative result may occur with  improper specimen collection/handling, submission of specimen other than nasopharyngeal swab, presence of viral mutation(s) within the areas targeted by this assay, and inadequate number of viral copies(<138 copies/mL). A negative result must be combined with clinical observations, patient history, and epidemiological information. The expected result is Negative.  Fact Sheet for Patients:  EntrepreneurPulse.com.au  Fact Sheet for Healthcare Providers:  IncredibleEmployment.be  This test is  no t yet approved or cleared by the [place] FDA and  has been authorized for detection and/or diagnosis of SARS-CoV-2 by FDA under an Emergency Use Authorization (EUA). This EUA will remain  in effect (meaning this test can be used) for the duration of the COVID-19 declaration under Section 564(b)(1) of the Act, 21 U.S.C.section 360bbb-3(b)(1), unless the authorization is terminated  or revoked sooner.       Influenza A by PCR NEGATIVE NEGATIVE Final   Influenza B by PCR NEGATIVE NEGATIVE Final    Comment: (NOTE) The Xpert Xpress SARS-CoV-2/FLU/RSV plus assay is intended as an aid in the diagnosis of influenza from Nasopharyngeal swab specimens and should not be used as a sole basis for treatment. Nasal washings and aspirates are unacceptable for Xpert Xpress SARS-CoV-2/FLU/RSV testing.  Fact Sheet for Patients: EntrepreneurPulse.com.au  Fact Sheet for Healthcare Providers: IncredibleEmployment.be  This test is not yet approved or cleared by the [place] FDA and has been authorized for detection and/or diagnosis of SARS-CoV-2 by FDA under an Emergency Use Authorization (EUA). This EUA will remain in effect (meaning this test can be used) for the duration of the COVID-19 declaration under Section 564(b)(1) of the Act, 21 U.S.C. section 360bbb-3(b)(1), unless the authorization is terminated or revoked.  Performed at [facility], [address] [place], [place] [address]   CULTURE, BLOOD (ROUTINE X 2) w Reflex to ID Panel     Status: None (Preliminary result)   Collection Time: 10/04/21  8:08 AM   Specimen: BLOOD  Result Value Ref Range Status   Specimen Description BLOOD BLOOD RIGHT HAND  Final   Special Requests   Final    BOTTLES DRAWN AEROBIC AND ANAEROBIC Blood Culture adequate volume   Culture   Final    NO GROWTH 2 DAYS Performed at [facility], [address] [place], [place] [address]    Report  Status PENDING  Incomplete  CULTURE, BLOOD (ROUTINE X 2) w Reflex to ID Panel     Status: None (Preliminary result)   Collection Time: 10/04/21  8:08 AM   Specimen: BLOOD  Result Value Ref Range Status   Specimen Description BLOOD LEFT ANTECUBITAL  Final   Special Requests   Final    BOTTLES DRAWN AEROBIC AND ANAEROBIC Blood Culture adequate volume   Culture   Final    NO GROWTH 2  DAYS Performed at [facility], [address] [place], [place] [address]    Report Status PENDING  Incomplete     Labs:  CBC: Recent Labs  Lab 10/03/21 1142 10/04/21 0655 10/05/21 0739 10/06/21 0632  WBC 13.1* 8.7 7.6 5.7  NEUTROABS  --  7.1  --   --   HGB 9.7* 9.5* 8.8* 8.8*  HCT 29.1* 29.2* 26.9* 26.3*  MCV 98.6 97.0 96.4 96.3  PLT 211 201 208 219   BMP &GFR Recent Labs  Lab 10/03/21 1142 10/03/21 1840 10/04/21 0655 10/05/21 0739 10/06/21 0632  NA 138  --  139 139 141  K 4.3  --  4.3 4.1 3.8  CL 105  --  107 110 109  CO2 21*  --  21* 20* 20*  GLUCOSE 129*  --  103* 97 105*  BUN 100*  --  92* 79* 68*  CREATININE 4.18*  --  3.70* 3.10* 2.62*  CALCIUM 8.6*  --  8.2* 8.1* 8.2*  MG  --  2.3  --  2.0 1.9  PHOS  --  5.2*  --  3.5 3.1   Estimated Creatinine Clearance: 7.2 mL/min (A) (by C-G formula based on SCr of 2.62 mg/dL (H)). Liver & Pancreas: Recent Labs  Lab 10/05/21 0739 10/06/21 0632  ALBUMIN 2.8* 2.8*   No results for input(s): LIPASE, AMYLASE in the last 168 hours. No results for input(s): AMMONIA in the last 168 hours. Diabetic: No results for input(s): HGBA1C in the last 72 hours. No results for input(s): GLUCAP in the last 168 hours. Cardiac Enzymes: Recent Labs  Lab 10/04/21 0655 10/06/21 0632  CKTOTAL 372* 109   No results for input(s): PROBNP in the last 8760 hours. Coagulation Profile: Recent Labs  Lab 10/04/21 0655  INR 1.1   Thyroid Function Tests: No results for input(s): TSH, T4TOTAL, FREET4, T3FREE, THYROIDAB in the last 72  hours. Lipid Profile: No results for input(s): CHOL, HDL, LDLCALC, TRIG, CHOLHDL, LDLDIRECT in the last 72 hours. Anemia Panel: Recent Labs    10/06/21 0632  VITAMINB12 985*  FOLATE 42.0  FERRITIN 76  TIBC 234*  IRON 22*  RETICCTPCT 0.9   Urine analysis:    Component Value Date/Time   COLORURINE YELLOW 10/03/2021 1142   APPEARANCEUR CLEAR 10/03/2021 1142   APPEARANCEUR Cloudy (A) 04/10/2021 1005   LABSPEC 1.020 10/03/2021 1142   LABSPEC 1.004 04/13/2012 1932   PHURINE 5.5 10/03/2021 1142   GLUCOSEU NEGATIVE 10/03/2021 1142   GLUCOSEU Negative 04/13/2012 1932   HGBUR SMALL (A) 10/03/2021 1142   BILIRUBINUR NEGATIVE 10/03/2021 1142   BILIRUBINUR Negative 04/10/2021 1005   BILIRUBINUR Negative 04/13/2012 1932   KETONESUR NEGATIVE 10/03/2021 1142   PROTEINUR 100 (A) 10/03/2021 1142   NITRITE NEGATIVE 10/03/2021 1142   LEUKOCYTESUR LARGE (A) 10/03/2021 1142   LEUKOCYTESUR 3+ 04/13/2012 1932   Sepsis Labs: Invalid input(s): PROCALCITONIN, LACTICIDVEN   Time coordinating discharge: 45 minutes  SIGNED:  Mercy Riding, MD  [facility] 10/06/2021, 5:50 PM

## 2021-10-09 LAB — CULTURE, BLOOD (ROUTINE X 2)
Culture: NO GROWTH
Culture: NO GROWTH
Special Requests: ADEQUATE
Special Requests: ADEQUATE

## 2021-10-19 ENCOUNTER — Other Ambulatory Visit: Payer: Self-pay

## 2021-10-20 ENCOUNTER — Emergency Department: Payer: Medicare Other

## 2021-10-20 ENCOUNTER — Emergency Department
Admission: EM | Admit: 2021-10-20 | Discharge: 2021-10-20 | Disposition: A | Discharge status: Home / Self Care | Payer: Medicare Other | Attending: Emergency Medicine | Admitting: Emergency Medicine

## 2021-10-20 ENCOUNTER — Other Ambulatory Visit: Payer: Self-pay

## 2021-10-20 DIAGNOSIS — S0990XA Unspecified injury of head, initial encounter: Secondary | ICD-10-CM | POA: Insufficient documentation

## 2021-10-20 DIAGNOSIS — S51012A Laceration without foreign body of left elbow, initial encounter: Secondary | ICD-10-CM | POA: Insufficient documentation

## 2021-10-20 DIAGNOSIS — W01198A Fall on same level from slipping, tripping and stumbling with subsequent striking against other object, initial encounter: Secondary | ICD-10-CM | POA: Insufficient documentation

## 2021-10-20 DIAGNOSIS — S59902A Unspecified injury of left elbow, initial encounter: Secondary | ICD-10-CM | POA: Diagnosis present

## 2021-10-20 DIAGNOSIS — Z85828 Personal history of other malignant neoplasm of skin: Secondary | ICD-10-CM | POA: Insufficient documentation

## 2021-10-20 DIAGNOSIS — Z79899 Other long term (current) drug therapy: Secondary | ICD-10-CM | POA: Diagnosis not present

## 2021-10-20 DIAGNOSIS — I10 Essential (primary) hypertension: Secondary | ICD-10-CM | POA: Insufficient documentation

## 2021-10-20 DIAGNOSIS — M25522 Pain in left elbow: Secondary | ICD-10-CM

## 2021-10-20 NOTE — ED Provider Notes (Signed)
[facility]EMERGENCY DEPARTMENT  ____________________________________________  Time seen: Approximately 11:29 PM  I have reviewed the triage vital signs and the nursing notes.   HISTORY  Chief Complaint Fall   Historian Patient     HPI April Green is a 87 y.o. female presents to the emergency department with left elbow pain after mechanical fall.  Patient reports that she lost her balance and fell.  She states that she did hit her head.  Denies use of blood thinners.  No neck pain.  No numbness or tingling in the upper and lower extremities.  No chest pain or abdominal pain.   Past Medical History:  Diagnosis Date   Acute cystitis    Anxiety    Cancer ([place])    skin cancer   Chronic UTI    Depression    Dyspnea    Dysrhythmia    tachy arrythmia   Glaucoma    History of kidney stones    Hydronephrosis    stage IV ckd   Hypertension    Incomplete bladder emptying    Macular degeneration    PONV (postoperative nausea and vomiting)    nausea only   Renal cyst    SUI (stress urinary incontinence, female)    Unsteady gait    UPJ obstruction, congenital    Vertigo    Weight loss, unintentional 04/2021   having difficulty keeping weight on     Immunizations up to date:  Yes.     Past Medical History:  Diagnosis Date   Acute cystitis    Anxiety    Cancer ([place])    skin cancer   Chronic UTI    Depression    Dyspnea    Dysrhythmia    tachy arrythmia   Glaucoma    History of kidney stones    Hydronephrosis    stage IV ckd   Hypertension    Incomplete bladder emptying    Macular degeneration    PONV (postoperative nausea and vomiting)    nausea only   Renal cyst    SUI (stress urinary incontinence, female)    Unsteady gait    UPJ obstruction, congenital    Vertigo    Weight loss, unintentional 04/2021   having difficulty keeping weight on    Patient Active Problem List   Diagnosis Date Noted   Protein-calorie malnutrition, severe 10/05/2021    Sepsis ([place]) 10/03/2021   Essential hypertension 10/03/2021   AKI (acute kidney injury) ([place]) 10/03/2021   Leukocytosis 10/03/2021   Recurrent UTI 11/07/2015   Atrophic vaginitis 11/07/2015   Urinary tract infectious disease 10/19/2015   UPJ obstruction, congenital 10/19/2015    Past Surgical History:  Procedure Laterality Date   ABDOMINAL HYSTERECTOMY     cysto stent exchange     once a year for last 6 years. metal stent   CYSTOSCOPY W/ RETROGRADES Right 02/16/2018   Procedure: CYSTOSCOPY WITH RETROGRADE PYELOGRAM;  Surgeon: Hollice Espy, MD;  Location: [facility];  Service: Urology;  Laterality: Right;   CYSTOSCOPY W/ RETROGRADES Right 04/24/2020   Procedure: CYSTOSCOPY WITH RETROGRADE PYELOGRAM;  Surgeon: Hollice Espy, MD;  Location: [facility];  Service: Urology;  Laterality: Right;   CYSTOSCOPY W/ RETROGRADES Right 04/16/2021   Procedure: CYSTOSCOPY WITH RETROGRADE PYELOGRAM;  Surgeon: Hollice Espy, MD;  Location: [facility];  Service: Urology;  Laterality: Right;   CYSTOSCOPY W/ URETERAL STENT PLACEMENT Right 01/01/2016   Procedure: CYSTOSCOPY WITH STENT REPLACEMENT;  Surgeon: Hollice Espy, MD;  Location: [facility];  Service: Urology;  Laterality: Right;   CYSTOSCOPY W/ URETERAL STENT PLACEMENT Right 02/18/2017   Procedure: CYSTOSCOPY WITH STENT REPLACEMENT;  Surgeon: Hollice Espy, MD;  Location: [facility];  Service: Urology;  Laterality: Right;   CYSTOSCOPY W/ URETERAL STENT PLACEMENT Right 02/16/2018   Procedure: CYSTOSCOPY WITH STENT REPLACEMENT;  Surgeon: Hollice Espy, MD;  Location: [facility];  Service: Urology;  Laterality: Right;   CYSTOSCOPY W/ URETERAL STENT PLACEMENT Right 06/07/2019   Procedure: CYSTOSCOPY WITH STENT REPLACEMENT;  Surgeon: Hollice Espy, MD;  Location: [facility];  Service: Urology;  Laterality: Right;   CYSTOSCOPY W/ URETERAL STENT PLACEMENT Right 04/24/2020   Procedure: CYSTOSCOPY WITH STENT REPLACEMENT;  Surgeon: Hollice Espy, MD;  Location: [facility]  ORS;  Service: Urology;  Laterality: Right;  metal stent   CYSTOSCOPY W/ URETERAL STENT PLACEMENT Right 04/16/2021   Procedure: CYSTOSCOPY WITH STENT REPLACEMENT;  Surgeon: Hollice Espy, MD;  Location: [facility];  Service: Urology;  Laterality: Right;   EYE SURGERY Bilateral    cataract extractions   HERNIA REPAIR Left 05/2008   inguinal hernia    Prior to Admission medications   Medication Sig Start Date End Date Taking? Authorizing Provider  acetaminophen (TYLENOL) 325 MG tablet Take 325-650 mg by mouth every 6 (six) hours as needed for moderate pain.    [provider]  amLODipine (NORVASC) 10 MG tablet Take 10 mg by mouth at bedtime.  03/06/15   [provider]  busPIRone (BUSPAR) 7.5 MG tablet Take 7.5 mg by mouth 2 (two) times daily.    [provider]  estradiol (ESTRACE VAGINAL) 0.1 MG/GM vaginal cream Apply 0.5mg  (pea-sized amount)  just inside the vaginal introitus with a finger-tip every night for two weeks and then Monday, Wednesday and Friday nights. 02/24/20   Hollice Espy, MD  latanoprost (XALATAN) 0.005 % ophthalmic solution Place 1 drop into both eyes at bedtime.     [provider]  LORazepam (ATIVAN) 0.5 MG tablet Take 0.5 mg by mouth at bedtime.    [provider]  Melatonin 5 MG CAPS Take 5 mg by mouth at bedtime.  12/03/19   [provider]  Multiple Vitamins-Minerals (PRESERVISION AREDS 2+MULTI VIT) CAPS Take 1 capsule by mouth 2 (two) times daily.    [provider]  torsemide (DEMADEX) 10 MG tablet Take 1 tablet (10 mg total) by mouth every other day. 10/20/21   Mercy Riding, MD    Allergies Sulfa antibiotics, Amoxicillin-pot clavulanate, Azithromycin, Diphenhydramine, Trazodone and nefazodone, Lokelma [sodium zirconium cyclosilicate], 5-methoxypsoralen, Aspirin, Ceftriaxone, Flexeril [cyclobenzaprine], Levofloxacin, Mirtazapine, and Oxycodone  Family History  Problem Relation Age of Onset   Kidney  disease Neg Hx    Bladder Cancer Neg Hx    Breast cancer Neg Hx     Social History Social History   Tobacco Use   Smoking status: Never   Smokeless tobacco: Never  Substance Use Topics   Alcohol use: No    Alcohol/week: 0.0 standard drinks   Drug use: No     Review of Systems  Constitutional: No fever/chills Eyes:  No discharge ENT: No upper respiratory complaints. Respiratory: no cough. No SOB/ use of accessory muscles to breath Gastrointestinal:   No nausea, no vomiting.  No diarrhea.  No constipation. Musculoskeletal: Patient has left elbow pain.  Skin: Patient has small skin tear at left elbow.    ____________________________________________   PHYSICAL EXAM:  VITAL SIGNS: ED Triage Vitals  Enc Vitals Group     BP 10/20/21 2017 (!) 164/69  Pulse Rate 10/20/21 2017 94     Resp 10/20/21 2017 18     Temp 10/20/21 2017 97.8 F (36.6 C)     Temp Source 10/20/21 2017 Oral     SpO2 10/20/21 2017 95 %     Weight 10/20/21 2017 80 lb (36.3 kg)     Height 10/20/21 2017 5\' 1"  (1.549 m)     Head Circumference --      Peak Flow --      Pain Score 10/20/21 2022 5     Pain Loc --      Pain Edu? --      Excl. in [place]? --      Constitutional: Alert and oriented. Well appearing and in no acute distress. Eyes: Conjunctivae are normal. PERRL. EOMI. Head: Atraumatic. ENT:  Cardiovascular: Normal rate, regular rhythm. Normal S1 and S2.  Good peripheral circulation. Respiratory: Normal respiratory effort without tachypnea or retractions. Lungs CTAB. Good air entry to the bases with no decreased or absent breath sounds Gastrointestinal: Bowel sounds x 4 quadrants. Soft and nontender to palpation. No guarding or rigidity. No distention. Musculoskeletal: Full range of motion to all extremities. No obvious deformities noted Neurologic:  Normal for age. No gross focal neurologic deficits are appreciated.  Skin: Patient has 2 cm x 1 cm skin tear at left elbow. Psychiatric: Mood  and affect are normal for age. Speech and behavior are normal.   ____________________________________________   LABS (all labs ordered are listed, but only abnormal results are displayed)  Labs Reviewed - No data to display ____________________________________________  EKG   ____________________________________________  RADIOLOGY Unk Pinto, personally viewed and evaluated these images (plain radiographs) as part of my medical decision making, as well as reviewing the written report by the radiologist.  DG Elbow 2 Views Left  Result Date: 10/20/2021 CLINICAL DATA:  Unwitnessed fall. Pain to left elbow. Skin tear noted at the posterior aspect of left elbow. EXAM: LEFT ELBOW - 2 VIEW COMPARISON:  None. FINDINGS: There is no evidence of fracture or dislocation. Small elbow joint effusion. Soft tissue defect overlying the olecranon likely corresponds to reported skin tear. IMPRESSION: No acute osseous abnormality.  Small elbow joint effusion. Electronically Signed   By: Ileana Roup M.D.   On: 10/20/2021 21:04   CT HEAD WO CONTRAST (5MM)  Result Date: 10/20/2021 CLINICAL DATA:  Unwitnessed fall. EXAM: CT HEAD WITHOUT CONTRAST TECHNIQUE: Contiguous axial images were obtained from the base of the skull through the vertex without intravenous contrast. COMPARISON:  October 03, 2021 FINDINGS: Brain: There is mild to moderate severity cerebral atrophy with widening of the extra-axial spaces and ventricular dilatation. There are areas of decreased attenuation within the white matter tracts of the supratentorial brain, consistent with microvascular disease changes. Vascular: No hyperdense vessel or unexpected calcification. Skull: Normal. Negative for fracture or focal lesion. Sinuses/Orbits: No acute finding. Other: Mild scalp soft tissue swelling is seen along the posterior aspect of the vertex on the left. IMPRESSION: 1. Mild scalp soft tissue swelling along the posterior aspect of the  vertex on the left, without evidence of an acute fracture or acute intracranial abnormality. 2. Cerebral atrophy and microvascular disease changes of the supratentorial brain. Electronically Signed   By: Virgina Norfolk M.D.   On: 10/20/2021 20:45    ____________________________________________    PROCEDURES  Procedure(s) performed:     Marland KitchenMarland KitchenLaceration Repair  Date/Time: 10/20/2021 11:31 PM Performed by: Lannie Fields, PA-C Authorized by: Lannie Fields, PA-C  Consent:    Consent obtained:  Verbal   Risks discussed:  Infection and pain Universal protocol:    Procedure explained and questions answered to patient or proxy's satisfaction: yes     Patient identity confirmed:  Verbally with patient Laceration details:    Length (cm):  2 Treatment:    Area cleansed with:  Povidone-iodine   Debridement:  None Skin repair:    Repair method:  Tissue adhesive Approximation:    Approximation:  Close Repair type:    Repair type:  Simple Post-procedure details:    Dressing:  Bulky dressing     Medications - No data to display   ____________________________________________   INITIAL IMPRESSION / ASSESSMENT AND PLAN / ED COURSE  Pertinent labs & imaging results that were available during my care of the patient were reviewed by me and considered in my medical decision making (see chart for details).       Assessment and plan Fall 87 year old female presents to the emergency department after a mechanical fall.  CT head was reassuring without skull fracture or intracranial bleed.  Skin tear was repaired without complication.  No bony abnormality on x-ray.  Tylenol was recommended for discomfort.    ____________________________________________  FINAL CLINICAL IMPRESSION(S) / ED DIAGNOSES  Final diagnoses:  Left elbow pain      NEW MEDICATIONS STARTED DURING THIS VISIT:  ED Discharge Orders     None           This chart was dictated using voice  recognition software/Dragon. Despite best efforts to proofread, errors can occur which can change the meaning. Any change was purely unintentional.     Lannie Fields, PA-C 10/20/21 2332    Nena Polio, MD 10/21/21 0040

## 2021-10-20 NOTE — ED Notes (Signed)
Left elbow skin tear cleansed with NS and wrapped with gauze.

## 2021-10-20 NOTE — Discharge Instructions (Signed)
Keep wound clean and dry. You can change dressing every 24 hours.

## 2021-10-20 NOTE — ED Triage Notes (Signed)
Pt presents [facility] ER [facility] from [facility] [place] after unwitness fall.  Pt currently c/o pain to her left elbow.  Pt has skin tear noted to left elbow w/bleeding controlled at this time.  Pt also has hematoma noted to posterior head.  Pt denies LOC, denies taking blood thinners, denies head/neck pain.

## 2021-10-20 NOTE — ED Triage Notes (Signed)
FIRST NURSE NOTE:  PT from [facility], arrived via [facility] unwitnessed fall, fell after washing hands, L elbow skin tear, c/o CP.  EKG = ST with EMS  90-105 pulse RR 14-18 170s/90s BP with EMS.

## 2021-11-05 ENCOUNTER — Other Ambulatory Visit: Payer: Self-pay

## 2021-11-05 ENCOUNTER — Encounter: Payer: Self-pay | Admitting: Emergency Medicine

## 2021-11-05 ENCOUNTER — Emergency Department: Payer: Medicare Other

## 2021-11-05 ENCOUNTER — Inpatient Hospital Stay
Admission: EM | Admit: 2021-11-05 | Discharge: 2021-12-05 | DRG: 480 | Disposition: E | Payer: Medicare Other | Source: Skilled Nursing Facility | Attending: Student | Admitting: Student

## 2021-11-05 DIAGNOSIS — D72829 Elevated white blood cell count, unspecified: Secondary | ICD-10-CM | POA: Diagnosis present

## 2021-11-05 DIAGNOSIS — E43 Unspecified severe protein-calorie malnutrition: Secondary | ICD-10-CM | POA: Diagnosis present

## 2021-11-05 DIAGNOSIS — M25551 Pain in right hip: Secondary | ICD-10-CM | POA: Diagnosis not present

## 2021-11-05 DIAGNOSIS — H9190 Unspecified hearing loss, unspecified ear: Secondary | ICD-10-CM | POA: Diagnosis present

## 2021-11-05 DIAGNOSIS — D62 Acute posthemorrhagic anemia: Secondary | ICD-10-CM | POA: Diagnosis not present

## 2021-11-05 DIAGNOSIS — Z87891 Personal history of nicotine dependence: Secondary | ICD-10-CM

## 2021-11-05 DIAGNOSIS — Z681 Body mass index (BMI) 19 or less, adult: Secondary | ICD-10-CM

## 2021-11-05 DIAGNOSIS — R0603 Acute respiratory distress: Secondary | ICD-10-CM

## 2021-11-05 DIAGNOSIS — Z515 Encounter for palliative care: Secondary | ICD-10-CM

## 2021-11-05 DIAGNOSIS — H3530 Unspecified macular degeneration: Secondary | ICD-10-CM | POA: Diagnosis present

## 2021-11-05 DIAGNOSIS — Z85828 Personal history of other malignant neoplasm of skin: Secondary | ICD-10-CM

## 2021-11-05 DIAGNOSIS — R0602 Shortness of breath: Secondary | ICD-10-CM

## 2021-11-05 DIAGNOSIS — Z888 Allergy status to other drugs, medicaments and biological substances status: Secondary | ICD-10-CM

## 2021-11-05 DIAGNOSIS — F32A Depression, unspecified: Secondary | ICD-10-CM | POA: Diagnosis present

## 2021-11-05 DIAGNOSIS — N184 Chronic kidney disease, stage 4 (severe): Secondary | ICD-10-CM

## 2021-11-05 DIAGNOSIS — Z960 Presence of urogenital implants: Secondary | ICD-10-CM | POA: Diagnosis present

## 2021-11-05 DIAGNOSIS — I129 Hypertensive chronic kidney disease with stage 1 through stage 4 chronic kidney disease, or unspecified chronic kidney disease: Secondary | ICD-10-CM | POA: Diagnosis present

## 2021-11-05 DIAGNOSIS — J9601 Acute respiratory failure with hypoxia: Secondary | ICD-10-CM | POA: Diagnosis not present

## 2021-11-05 DIAGNOSIS — S72009A Fracture of unspecified part of neck of unspecified femur, initial encounter for closed fracture: Secondary | ICD-10-CM | POA: Diagnosis present

## 2021-11-05 DIAGNOSIS — Z881 Allergy status to other antibiotic agents status: Secondary | ICD-10-CM

## 2021-11-05 DIAGNOSIS — S72141A Displaced intertrochanteric fracture of right femur, initial encounter for closed fracture: Principal | ICD-10-CM | POA: Diagnosis present

## 2021-11-05 DIAGNOSIS — S72001A Fracture of unspecified part of neck of right femur, initial encounter for closed fracture: Secondary | ICD-10-CM | POA: Diagnosis present

## 2021-11-05 DIAGNOSIS — I10 Essential (primary) hypertension: Secondary | ICD-10-CM | POA: Diagnosis present

## 2021-11-05 DIAGNOSIS — W19XXXA Unspecified fall, initial encounter: Principal | ICD-10-CM

## 2021-11-05 DIAGNOSIS — Z886 Allergy status to analgesic agent status: Secondary | ICD-10-CM

## 2021-11-05 DIAGNOSIS — W010XXA Fall on same level from slipping, tripping and stumbling without subsequent striking against object, initial encounter: Secondary | ICD-10-CM | POA: Diagnosis present

## 2021-11-05 DIAGNOSIS — Z79899 Other long term (current) drug therapy: Secondary | ICD-10-CM

## 2021-11-05 DIAGNOSIS — Y92129 Unspecified place in nursing home as the place of occurrence of the external cause: Secondary | ICD-10-CM

## 2021-11-05 DIAGNOSIS — E875 Hyperkalemia: Secondary | ICD-10-CM | POA: Diagnosis not present

## 2021-11-05 DIAGNOSIS — Z419 Encounter for procedure for purposes other than remedying health state, unspecified: Secondary | ICD-10-CM

## 2021-11-05 DIAGNOSIS — D509 Iron deficiency anemia, unspecified: Secondary | ICD-10-CM | POA: Diagnosis present

## 2021-11-05 DIAGNOSIS — Z880 Allergy status to penicillin: Secondary | ICD-10-CM

## 2021-11-05 DIAGNOSIS — Z882 Allergy status to sulfonamides status: Secondary | ICD-10-CM

## 2021-11-05 DIAGNOSIS — F419 Anxiety disorder, unspecified: Secondary | ICD-10-CM | POA: Diagnosis present

## 2021-11-05 DIAGNOSIS — G4700 Insomnia, unspecified: Secondary | ICD-10-CM | POA: Diagnosis present

## 2021-11-05 DIAGNOSIS — Z885 Allergy status to narcotic agent status: Secondary | ICD-10-CM

## 2021-11-05 DIAGNOSIS — F0392 Unspecified dementia, unspecified severity, with psychotic disturbance: Secondary | ICD-10-CM | POA: Diagnosis not present

## 2021-11-05 DIAGNOSIS — N390 Urinary tract infection, site not specified: Secondary | ICD-10-CM | POA: Diagnosis present

## 2021-11-05 DIAGNOSIS — Z66 Do not resuscitate: Secondary | ICD-10-CM | POA: Diagnosis present

## 2021-11-05 DIAGNOSIS — Z20822 Contact with and (suspected) exposure to covid-19: Secondary | ICD-10-CM | POA: Diagnosis present

## 2021-11-05 DIAGNOSIS — Q6239 Other obstructive defects of renal pelvis and ureter: Secondary | ICD-10-CM

## 2021-11-05 DIAGNOSIS — H409 Unspecified glaucoma: Secondary | ICD-10-CM | POA: Diagnosis present

## 2021-11-05 MED ORDER — FENTANYL CITRATE PF 50 MCG/ML IJ SOSY
25.0000 ug | PREFILLED_SYRINGE | INTRAMUSCULAR | Status: DC | PRN
Start: 2021-11-05 — End: 2021-11-06
  Administered 2021-11-06: 25 ug via INTRAVENOUS
  Filled 2021-11-05: qty 1

## 2021-11-05 MED ORDER — LACTATED RINGERS IV SOLN: INTRAVENOUS | Status: DC

## 2021-11-05 MED ORDER — HYDROCODONE-ACETAMINOPHEN 5-325 MG PO TABS: 1.0000 | ORAL_TABLET | Freq: Once | ORAL | Status: DC

## 2021-11-05 NOTE — ED Triage Notes (Signed)
PT to ED via [facility] from [facility] with c/o mechanical fall. Per EMS pt tripped while walking with a walker, pt c/o R hip pain and swelling. Per EMS pt did not hit her head, no LOC, no further complaints.   Roderic Palau, [place] in triage, MSE done at this time.

## 2021-11-05 NOTE — ED Provider Notes (Signed)
[facility] Provider Note    Event Date/Time   First MD Initiated Contact with Patient 11/04/2021 2309     (approximate)   History   Fall and Hip Pain   HPI  CHESNIE CAPELL is a 88 y.o. female with a history of hypertension who presents for evaluation after mechanical fall from her nursing home.  Patient reports that she was walking with her walker when her foot got caught and the walker kept moving leading to a fall.  She fell onto her right hip.  She is complaining of right hip pain which has been constant since the fall, unable to ambulate or bear weight, pain is worse with any movement of the leg.  She did hit her head, she denies headache.  She denies LOC.  She is not on blood thinners.  She denies neck pain, back pain, headache, chest pain, abdominal pain.     Physical Exam   Triage Vital Signs: ED Triage Vitals  Enc Vitals Group     BP 11/11/2021 2129 (!) 152/69     Pulse Rate 11/17/2021 2129 87     Resp 11/16/2021 2129 20     Temp 11/15/2021 2129 98 F (36.7 C)     Temp Source 11/11/2021 2129 Oral     SpO2 11/30/2021 2129 96 %     Weight 11/16/2021 2126 80 lb (36.3 kg)     Height 11/19/2021 2126 5\' 1"  (1.549 m)     Head Circumference --      Peak Flow --      Pain Score --      Pain Loc --      Pain Edu? --      Excl. in [place]? --     Most recent vital signs: Vitals:   12/01/2021 2129  BP: (!) 152/69  Pulse: 87  Resp: 20  Temp: 98 F (36.7 C)  SpO2: 96%     Full spinal precautions maintained throughout the trauma exam. Constitutional: Alert and oriented. No acute distress. Does not appear intoxicated. HEENT Head: Normocephalic and atraumatic. Face: No facial bony tenderness. Stable midface Ears: No hemotympanum bilaterally. No Battle sign Eyes: No eye injury. PERRL. No raccoon eyes Nose: Nontender. No epistaxis. No rhinorrhea Mouth/Throat: Mucous membranes are moist. No oropharyngeal blood. No dental injury. Airway patent without stridor. Normal  voice. Neck: no C-collar. No midline c-spine tenderness.  Cardiovascular: Normal rate, regular rhythm. Normal and symmetric distal pulses are present in all extremities. Pulmonary/Chest: Chest wall is stable and nontender to palpation/compression. Normal respiratory effort. Breath sounds are normal. No crepitus.  Abdominal: Soft, nontender, non distended. Musculoskeletal: Shortened and internally rotated right lower extremity with tenderness in the proximal femoral area.  Nontender with normal full range of motion in all extremities. No deformities. No thoracic or lumbar midline spinal tenderness. Pelvis is stable. Skin: Skin is warm, dry and intact. No abrasions or contutions. Psychiatric: Speech and behavior are appropriate. Neurological: Normal speech and language. Moves all extremities to command. No gross focal neurologic deficits are appreciated.  Glascow Coma Score: 4 - Opens eyes on own 6 - Follows simple motor commands 5 - Alert and oriented GCS: 15    ED Results / Procedures / Treatments   Labs (all labs ordered are listed, but only abnormal results are displayed) Labs Reviewed  COMPREHENSIVE METABOLIC PANEL - Abnormal; Notable for the following components:      Result Value   Potassium 5.2 (*)    Glucose,  Bld 116 (*)    BUN 64 (*)    Creatinine, Ser 2.67 (*)    Alkaline Phosphatase 135 (*)    GFR, Estimated 16 (*)    All other components within normal limits  CBC WITH DIFFERENTIAL/PLATELET - Abnormal; Notable for the following components:   WBC 13.2 (*)    RBC 3.06 (*)    Hemoglobin 9.6 (*)    HCT 29.9 (*)    Neutro Abs 11.4 (*)    All other components within normal limits  RESP PANEL BY RT-PCR (FLU A&B, COVID) ARPGX2  PROTIME-INR  BASIC METABOLIC PANEL  CBC  TYPE AND SCREEN     EKG  ED ECG REPORT I, Rudene Re, the attending physician, personally viewed and interpreted this ECG.  Sinus rhythm with a rate of 94, normal intervals, normal axis, no  ST elevations or depressions.   RADIOLOGY I have personally reviewed the images performed during this visit and I agree with the Radiologist's read.   Interpretation by Radiologist:  DG Shoulder Right  Result Date: 11/30/2021 CLINICAL DATA:  Status post fall. EXAM: RIGHT SHOULDER - 2+ VIEW COMPARISON:  None. FINDINGS: There is no evidence of acute fracture or dislocation. Mild chronic changes seen along the greater tubercle of the right humeral head. Mild degenerative changes are seen involving the right glenohumeral articulation. Soft tissues are unremarkable. IMPRESSION: 1. No acute fracture or dislocation. 2. Mild degenerative changes. Electronically Signed   By: Virgina Norfolk M.D.   On: 11/17/2021 22:31   CT Head Wo Contrast  Result Date: 11/25/2021 CLINICAL DATA:  Status post fall. EXAM: CT HEAD WITHOUT CONTRAST TECHNIQUE: Contiguous axial images were obtained from the base of the skull through the vertex without intravenous contrast. COMPARISON:  None. FINDINGS: Brain: There is mild to moderate severity cerebral atrophy with widening of the extra-axial spaces and ventricular dilatation. There are areas of decreased attenuation within the white matter tracts of the supratentorial brain, consistent with microvascular disease changes. Vascular: No hyperdense vessel or unexpected calcification. Skull: Normal. Negative for fracture or focal lesion. Sinuses/Orbits: No acute finding. Other: None. IMPRESSION: 1. Generalized cerebral atrophy and microvascular disease changes of the supratentorial brain. 2. No acute intracranial abnormality. Electronically Signed   By: Virgina Norfolk M.D.   On: 11/04/2021 22:00   CT Cervical Spine Wo Contrast  Result Date: 12/01/2021 CLINICAL DATA:  Status post fall. EXAM: CT CERVICAL SPINE WITHOUT CONTRAST TECHNIQUE: Multidetector CT imaging of the cervical spine was performed without intravenous contrast. Multiplanar CT image reconstructions were also generated.  COMPARISON:  October 03, 2021 FINDINGS: Alignment: There is approximately 2 mm anterolisthesis of the C7 vertebral body on T1. Skull base and vertebrae: No acute fracture. No primary bone lesion or focal pathologic process. Soft tissues and spinal canal: No prevertebral fluid or swelling. No visible canal hematoma. Disc levels: Mild multilevel endplate sclerosis is seen throughout the cervical spine. This is most prominent at the levels of C3-C4, C4-C5 and C5-C6. There is marked severity narrowing of the anterior atlantoaxial articulation. Marked severity posterior intervertebral disc space narrowing is seen at the levels of C2-C3, C3-C4, C4-C5, C5-C6 and C6-C7. Bilateral moderate to marked severity multilevel facet joint hypertrophy is noted. Upper chest: Negative. Other: None. IMPRESSION: 1. No acute fracture within the cervical spine. 2. Marked severity multilevel degenerative changes, most prominent at the levels of C2-C3, C3-C4, C4-C5, C5-C6 and C6-C7. Electronically Signed   By: Virgina Norfolk M.D.   On: 11/24/2021 22:04   DG Chest  Portable 1 View  Result Date: 11/13/2021 CLINICAL DATA:  Preop.  Right hip fracture.  Mechanical fall. EXAM: PORTABLE CHEST 1 VIEW COMPARISON:  Radiograph 10/03/2021 FINDINGS: The cardiomediastinal contours are normal. Atherosclerosis of the aortic arch. Borderline hyperinflation which is similar to prior exam. Pulmonary vasculature is normal. No consolidation, pleural effusion, or pneumothorax. No acute osseous abnormalities are seen. IMPRESSION: No acute chest findings. Electronically Signed   By: Keith Rake M.D.   On: 11/13/2021 23:30   DG Hip Unilat W or Wo Pelvis 2-3 Views Right  Result Date: 11/20/2021 CLINICAL DATA:  Post fall with right hip pain. Tripped while walking with walker. EXAM: DG HIP (WITH OR WITHOUT PELVIS) 2-3V RIGHT COMPARISON:  Pelvis and left hip radiograph 01/01/2019 FINDINGS: Acute intertrochanteric right proximal femur fracture. There is  fracture involvement of the greater and lesser trochanters with minimal proximal migration of the femoral shaft. Femoral head remains seated in the acetabulum. Mild chronic regularity of the right pubic body. The bones are diffusely under mineralized. Right-sided double-J stent in place, the proximal pigtail is at the level of L4, the distal pigtail in the left pelvis. IMPRESSION: 1. Acute intertrochanteric right proximal femur fracture with minimal proximal migration of the femoral shaft. 2. Right double-J stent in place, proximal pigtail at the level of L4, possibly in the ureter, though not well delineated by radiograph. Electronically Signed   By: Keith Rake M.D.   On: 12/02/2021 22:34   DG FEMUR, MIN 2 VIEWS RIGHT  Result Date: 11/13/2021 CLINICAL DATA:  Post fall with right hip pain. Tripped while walking with walker. EXAM: RIGHT FEMUR 2 VIEWS COMPARISON:  None. FINDINGS: Comminuted proximal femur fracture with inter trochanteric involvement, fracture involving both lesser and greater trochanters. Mild proximal migration of the femoral shaft. There is nondisplaced subtrochanteric involvement through the central aspect of the proximal femoral shaft approaching the medial cortex. The more distal femur is intact. Soft tissues appear a trophic. IMPRESSION: Comminuted displaced intertrochanteric proximal femur fracture. There is nondisplaced subtrochanteric involvement through the central aspect of the proximal femoral shaft. Electronically Signed   By: Keith Rake M.D.   On: 11/26/2021 22:36      PROCEDURES:  Critical Care performed: No  Procedures   MEDICATIONS ORDERED IN ED: Medications  HYDROcodone-acetaminophen (NORCO/VICODIN) 5-325 MG per tablet 1-2 tablet (has no administration in time range)  morphine 2 MG/ML injection 0.5 mg (has no administration in time range)  heparin injection 5,000 Units (has no administration in time range)  0.9 %  sodium chloride infusion (has no  administration in time range)     IMPRESSION / MDM / ASSESSMENT AND PLAN / ED COURSE  I reviewed the triage vital signs and the nursing notes.  88 y.o. female with a history of hypertension, CKD, anemia who presents for evaluation after mechanical fall from her nursing home complaining of hip pain.  Upon arrival patient looks well-appearing in no distress, she has a shortened and internally rotated right lower extremity with tenderness to the proximal femur area.  Strong distal pulses.  No other signs of traumatic injury on exam.  Differential diagnoses including hip fracture or dislocation.  Plan: X-rays of the pelvis and hip, CT head and cervical spine  ED COURSE: X-ray visualized by me consistent with a acute right hip fracture, read confirmed by radiology.  CT head and cervical spine also visualized by me with no signs of acute traumatic injury, confirmed by radiology.  Preop EKG, chest x-ray, and labs including  CBC, BMP, type and screen and INR ordered by me.  Chest x-ray reviewed with no acute pathology.  EKG with no signs of ischemia or dysrhythmias.  Labs showing elevated white count consistent with her recent fracture and fall, mildly stable anemia, stable creatinine of 2.67.  COVID and flu negative.  Normal INR.  Case was discussed with Dr. Youlanda Mighty from orthopedic surgery who will plan for surgical repair in the morning.  Hospitalist service was consulted for admission and accepted patient to their care.  Patient and niece were updated at bedside.  Patient was placed on telemetry for close monitoring of cardiorespiratory status.  EMR review including patient's last visit with him at her nephrologist 2 weeks ago where she was seen for her chronic kidney disease and hypertension.         FINAL CLINICAL IMPRESSION(S) / ED DIAGNOSES   Final diagnoses:  Fall, initial encounter  Right hip pain     Rx / DC Orders   ED Discharge Orders     None        Note:  This document was  prepared using Dragon voice recognition software and may include unintentional dictation errors.    Alfred Levins, [place], MD 11/23/2021 [phone]

## 2021-11-05 NOTE — ED Provider Notes (Signed)
Emergency Medicine Provider Triage Evaluation Note  April Green , a 88 y.o. female  was evaluated in triage.  Pt complains of right hip pain.  Patient came from [place] with a mechanical fall.  She caught her foot and walker causing her to fall onto her right hip.  She cannot remember whether she hit her head or not but does not believe she lost consciousness.  She did hit her head on the bed as she fell.  Patient is unable to move her right lower extremity.  Sharp pain to the right hip..  Review of Systems  Positive: Fall, right hip pain.  Landed on right shoulder, possibly hit her head Negative: Loss of consciousness, neck pain, chest pain, shortness of breath, GI complaints  Physical Exam  BP (!) 152/69 (BP Location: Right Arm)    Pulse 87    Temp 98 F (36.7 C) (Oral)    Resp 20    Ht 5\' 1"  (1.549 m)    Wt 36.3 kg    SpO2 96%    BMI 15.12 kg/m  Gen:   Awake, no distress   Resp:  Normal effort  MSK:   Unable to move right lower extremity at this time.  Patient has some shortening of the right lower extremity when compared to left.  Sharp hip pain to palpation. Other:    Medical Decision Making  Medically screening exam initiated at 9:35 PM.  Appropriate orders placed.  Cherie Dark was informed that the remainder of the evaluation will be completed by another provider, this initial triage assessment does not replace that evaluation, and the importance of remaining in the ED until their evaluation is complete.  Patient presents after a mechanical fall.  Landed on her right hip and shoulder.  Potentially hit her head.  Patient will have imaging at this time.  Patient is requesting that we do not stick her with a needle unless absolutely necessary.  We will hold on labs no concern at this time as the patient does have a right hip fracture.   Brynda Peon 11/29/2021 2135    Nance Pear, MD 11/28/2021 2234

## 2021-11-06 ENCOUNTER — Inpatient Hospital Stay: Payer: Medicare Other

## 2021-11-06 ENCOUNTER — Other Ambulatory Visit: Payer: Self-pay

## 2021-11-06 ENCOUNTER — Inpatient Hospital Stay: Payer: Medicare Other | Admitting: Anesthesiology

## 2021-11-06 ENCOUNTER — Encounter: Admission: EM | Disposition: E | Payer: Self-pay | Source: Skilled Nursing Facility | Attending: Student

## 2021-11-06 ENCOUNTER — Encounter: Payer: Self-pay | Admitting: Internal Medicine

## 2021-11-06 DIAGNOSIS — D62 Acute posthemorrhagic anemia: Secondary | ICD-10-CM | POA: Diagnosis not present

## 2021-11-06 DIAGNOSIS — J9601 Acute respiratory failure with hypoxia: Secondary | ICD-10-CM | POA: Diagnosis not present

## 2021-11-06 DIAGNOSIS — S72001A Fracture of unspecified part of neck of right femur, initial encounter for closed fracture: Secondary | ICD-10-CM | POA: Diagnosis not present

## 2021-11-06 DIAGNOSIS — Z66 Do not resuscitate: Secondary | ICD-10-CM | POA: Diagnosis present

## 2021-11-06 DIAGNOSIS — H3530 Unspecified macular degeneration: Secondary | ICD-10-CM | POA: Diagnosis present

## 2021-11-06 DIAGNOSIS — H409 Unspecified glaucoma: Secondary | ICD-10-CM | POA: Diagnosis present

## 2021-11-06 DIAGNOSIS — N184 Chronic kidney disease, stage 4 (severe): Secondary | ICD-10-CM

## 2021-11-06 DIAGNOSIS — Z515 Encounter for palliative care: Secondary | ICD-10-CM | POA: Diagnosis not present

## 2021-11-06 DIAGNOSIS — W19XXXA Unspecified fall, initial encounter: Secondary | ICD-10-CM

## 2021-11-06 DIAGNOSIS — Z87891 Personal history of nicotine dependence: Secondary | ICD-10-CM | POA: Diagnosis not present

## 2021-11-06 DIAGNOSIS — F0392 Unspecified dementia, unspecified severity, with psychotic disturbance: Secondary | ICD-10-CM | POA: Diagnosis not present

## 2021-11-06 DIAGNOSIS — S72141A Displaced intertrochanteric fracture of right femur, initial encounter for closed fracture: Secondary | ICD-10-CM | POA: Diagnosis present

## 2021-11-06 DIAGNOSIS — D509 Iron deficiency anemia, unspecified: Secondary | ICD-10-CM | POA: Diagnosis present

## 2021-11-06 DIAGNOSIS — F32A Depression, unspecified: Secondary | ICD-10-CM | POA: Diagnosis present

## 2021-11-06 DIAGNOSIS — E43 Unspecified severe protein-calorie malnutrition: Secondary | ICD-10-CM

## 2021-11-06 DIAGNOSIS — I129 Hypertensive chronic kidney disease with stage 1 through stage 4 chronic kidney disease, or unspecified chronic kidney disease: Secondary | ICD-10-CM | POA: Diagnosis present

## 2021-11-06 DIAGNOSIS — Z20822 Contact with and (suspected) exposure to covid-19: Secondary | ICD-10-CM | POA: Diagnosis present

## 2021-11-06 DIAGNOSIS — S72009A Fracture of unspecified part of neck of unspecified femur, initial encounter for closed fracture: Secondary | ICD-10-CM | POA: Diagnosis present

## 2021-11-06 DIAGNOSIS — E875 Hyperkalemia: Secondary | ICD-10-CM | POA: Diagnosis not present

## 2021-11-06 DIAGNOSIS — Y92129 Unspecified place in nursing home as the place of occurrence of the external cause: Secondary | ICD-10-CM | POA: Diagnosis not present

## 2021-11-06 DIAGNOSIS — W010XXA Fall on same level from slipping, tripping and stumbling without subsequent striking against object, initial encounter: Secondary | ICD-10-CM | POA: Diagnosis present

## 2021-11-06 DIAGNOSIS — R0603 Acute respiratory distress: Secondary | ICD-10-CM | POA: Diagnosis not present

## 2021-11-06 DIAGNOSIS — D72829 Elevated white blood cell count, unspecified: Secondary | ICD-10-CM

## 2021-11-06 DIAGNOSIS — Z85828 Personal history of other malignant neoplasm of skin: Secondary | ICD-10-CM | POA: Diagnosis not present

## 2021-11-06 DIAGNOSIS — Q6239 Other obstructive defects of renal pelvis and ureter: Secondary | ICD-10-CM | POA: Diagnosis not present

## 2021-11-06 DIAGNOSIS — F419 Anxiety disorder, unspecified: Secondary | ICD-10-CM | POA: Diagnosis present

## 2021-11-06 DIAGNOSIS — I10 Essential (primary) hypertension: Secondary | ICD-10-CM | POA: Diagnosis not present

## 2021-11-06 DIAGNOSIS — H9190 Unspecified hearing loss, unspecified ear: Secondary | ICD-10-CM | POA: Diagnosis present

## 2021-11-06 DIAGNOSIS — G4700 Insomnia, unspecified: Secondary | ICD-10-CM | POA: Diagnosis present

## 2021-11-06 DIAGNOSIS — Z886 Allergy status to analgesic agent status: Secondary | ICD-10-CM | POA: Diagnosis not present

## 2021-11-06 DIAGNOSIS — Z681 Body mass index (BMI) 19 or less, adult: Secondary | ICD-10-CM | POA: Diagnosis not present

## 2021-11-06 DIAGNOSIS — M25551 Pain in right hip: Secondary | ICD-10-CM | POA: Diagnosis present

## 2021-11-06 DIAGNOSIS — N390 Urinary tract infection, site not specified: Secondary | ICD-10-CM | POA: Diagnosis present

## 2021-11-06 HISTORY — PX: INTRAMEDULLARY (IM) NAIL INTERTROCHANTERIC: SHX5875

## 2021-11-06 LAB — CBC WITH DIFFERENTIAL/PLATELET
Abs Immature Granulocytes: 0.06 10*3/uL (ref 0.00–0.07)
Basophils Absolute: 0.1 10*3/uL (ref 0.0–0.1)
Basophils Relative: 1 %
Eosinophils Absolute: 0.2 10*3/uL (ref 0.0–0.5)
Eosinophils Relative: 1 %
HCT: 29.9 % — ABNORMAL LOW (ref 36.0–46.0)
Hemoglobin: 9.6 g/dL — ABNORMAL LOW (ref 12.0–15.0)
Immature Granulocytes: 1 %
Lymphocytes Relative: 6 %
Lymphs Abs: 0.8 10*3/uL (ref 0.7–4.0)
MCH: 31.4 pg (ref 26.0–34.0)
MCHC: 32.1 g/dL (ref 30.0–36.0)
MCV: 97.7 fL (ref 80.0–100.0)
Monocytes Absolute: 0.7 10*3/uL (ref 0.1–1.0)
Monocytes Relative: 5 %
Neutro Abs: 11.4 10*3/uL — ABNORMAL HIGH (ref 1.7–7.7)
Neutrophils Relative %: 86 %
Platelets: 384 10*3/uL (ref 150–400)
RBC: 3.06 MIL/uL — ABNORMAL LOW (ref 3.87–5.11)
RDW: 13.4 % (ref 11.5–15.5)
WBC: 13.2 10*3/uL — ABNORMAL HIGH (ref 4.0–10.5)
nRBC: 0 % (ref 0.0–0.2)

## 2021-11-06 LAB — CBC
HCT: 25.5 % — ABNORMAL LOW (ref 36.0–46.0)
Hemoglobin: 8.2 g/dL — ABNORMAL LOW (ref 12.0–15.0)
MCH: 31.3 pg (ref 26.0–34.0)
MCHC: 32.2 g/dL (ref 30.0–36.0)
MCV: 97.3 fL (ref 80.0–100.0)
Platelets: 318 10*3/uL (ref 150–400)
RBC: 2.62 MIL/uL — ABNORMAL LOW (ref 3.87–5.11)
RDW: 13.5 % (ref 11.5–15.5)
WBC: 11.4 10*3/uL — ABNORMAL HIGH (ref 4.0–10.5)
nRBC: 0 % (ref 0.0–0.2)

## 2021-11-06 LAB — BASIC METABOLIC PANEL
Anion gap: 7 (ref 5–15)
BUN: 56 mg/dL — ABNORMAL HIGH (ref 8–23)
CO2: 22 mmol/L (ref 22–32)
Calcium: 8.8 mg/dL — ABNORMAL LOW (ref 8.9–10.3)
Chloride: 111 mmol/L (ref 98–111)
Creatinine, Ser: 2.38 mg/dL — ABNORMAL HIGH (ref 0.44–1.00)
GFR, Estimated: 18 mL/min — ABNORMAL LOW (ref >60)
Glucose, Bld: 121 mg/dL — ABNORMAL HIGH (ref 70–99)
Potassium: 5 mmol/L (ref 3.5–5.1)
Sodium: 140 mmol/L (ref 135–145)

## 2021-11-06 LAB — URINALYSIS, COMPLETE (UACMP) WITH MICROSCOPIC
Bilirubin Urine: NEGATIVE
Glucose, UA: NEGATIVE mg/dL
Ketones, ur: NEGATIVE mg/dL
Nitrite: NEGATIVE
Protein, ur: 100 mg/dL — AB
Specific Gravity, Urine: 1.01 (ref 1.005–1.030)
WBC, UA: >50 WBC/hpf — ABNORMAL HIGH (ref 0–5)
pH: 5 (ref 5.0–8.0)

## 2021-11-06 LAB — COMPREHENSIVE METABOLIC PANEL
ALT: 18 U/L (ref 0–44)
AST: 19 U/L (ref 15–41)
Albumin: 3.7 g/dL (ref 3.5–5.0)
Alkaline Phosphatase: 135 U/L — ABNORMAL HIGH (ref 38–126)
Anion gap: 9 (ref 5–15)
BUN: 64 mg/dL — ABNORMAL HIGH (ref 8–23)
CO2: 22 mmol/L (ref 22–32)
Calcium: 9 mg/dL (ref 8.9–10.3)
Chloride: 107 mmol/L (ref 98–111)
Creatinine, Ser: 2.67 mg/dL — ABNORMAL HIGH (ref 0.44–1.00)
GFR, Estimated: 16 mL/min — ABNORMAL LOW (ref >60)
Glucose, Bld: 116 mg/dL — ABNORMAL HIGH (ref 70–99)
Potassium: 5.2 mmol/L — ABNORMAL HIGH (ref 3.5–5.1)
Sodium: 138 mmol/L (ref 135–145)
Total Bilirubin: 0.5 mg/dL (ref 0.3–1.2)
Total Protein: 7.1 g/dL (ref 6.5–8.1)

## 2021-11-06 LAB — PROTIME-INR
INR: 1.2 (ref 0.8–1.2)
Prothrombin Time: 15 seconds (ref 11.4–15.2)

## 2021-11-06 LAB — RESP PANEL BY RT-PCR (FLU A&B, COVID) ARPGX2
Influenza A by PCR: NEGATIVE
Influenza B by PCR: NEGATIVE
SARS Coronavirus 2 by RT PCR: NEGATIVE

## 2021-11-06 LAB — VITAMIN D 25 HYDROXY (VIT D DEFICIENCY, FRACTURES): Vit D, 25-Hydroxy: 55.57 ng/mL (ref 30–100)

## 2021-11-06 SURGERY — FIXATION, FRACTURE, INTERTROCHANTERIC, WITH INTRAMEDULLARY ROD
Anesthesia: Spinal | Laterality: Right

## 2021-11-06 MED ORDER — PROPOFOL 1000 MG/100ML IV EMUL
INTRAVENOUS | Status: AC
  Filled 2021-11-06: qty 100

## 2021-11-06 MED ORDER — MENTHOL 3 MG MT LOZG
1.0000 | LOZENGE | OROMUCOSAL | Status: DC | PRN
  Filled 2021-11-06: qty 9

## 2021-11-06 MED ORDER — PROPOFOL 10 MG/ML IV BOLUS
INTRAVENOUS | Status: DC | PRN
Start: 2021-11-06 — End: 2021-11-06
  Administered 2021-11-06 (×4): 10 mg via INTRAVENOUS

## 2021-11-06 MED ORDER — ONDANSETRON HCL 4 MG PO TABS: 4.0000 mg | ORAL_TABLET | Freq: Four times a day (QID) | ORAL | Status: DC | PRN

## 2021-11-06 MED ORDER — HYDROCODONE-ACETAMINOPHEN 5-325 MG PO TABS
1.0000 | ORAL_TABLET | Freq: Four times a day (QID) | ORAL | Status: DC | PRN
  Administered 2021-11-06: 2 via ORAL
  Administered 2021-11-07 (×2): 1 via ORAL
  Filled 2021-11-06 (×3): qty 2
  Filled 2021-11-06 (×3): qty 1

## 2021-11-06 MED ORDER — BUPIVACAINE HCL (PF) 0.5 % IJ SOLN
INTRAMUSCULAR | Status: DC | PRN
Start: 2021-11-06 — End: 2021-11-06
  Administered 2021-11-06: 2 mL via INTRATHECAL

## 2021-11-06 MED ORDER — CEFAZOLIN SODIUM-DEXTROSE 1-4 GM/50ML-% IV SOLN
INTRAVENOUS | Status: AC
  Filled 2021-11-06: qty 50

## 2021-11-06 MED ORDER — HYDRALAZINE HCL 20 MG/ML IJ SOLN: 5.0000 mg | Freq: Four times a day (QID) | INTRAMUSCULAR | Status: DC | PRN

## 2021-11-06 MED ORDER — BISACODYL 10 MG RE SUPP: 10.0000 mg | Freq: Every day | RECTAL | Status: DC | PRN

## 2021-11-06 MED ORDER — NEOMYCIN-POLYMYXIN B GU 40-200000 IR SOLN
Status: AC
  Filled 2021-11-06: qty 2

## 2021-11-06 MED ORDER — MORPHINE SULFATE (PF) 2 MG/ML IV SOLN
0.5000 mg | INTRAVENOUS | Status: DC | PRN
  Administered 2021-11-06 – 2021-11-09 (×4): 0.5 mg via INTRAVENOUS
  Filled 2021-11-06 (×3): qty 1

## 2021-11-06 MED ORDER — ONDANSETRON HCL 4 MG/2ML IJ SOLN: 4.0000 mg | Freq: Four times a day (QID) | INTRAMUSCULAR | Status: DC | PRN

## 2021-11-06 MED ORDER — BUPIVACAINE HCL (PF) 0.5 % IJ SOLN
INTRAMUSCULAR | Status: AC
  Filled 2021-11-06: qty 20

## 2021-11-06 MED ORDER — ADULT MULTIVITAMIN W/MINERALS CH
1.0000 | ORAL_TABLET | Freq: Every day | ORAL | Status: DC
  Administered 2021-11-07 – 2021-11-08 (×2): 1 via ORAL
  Filled 2021-11-06 (×3): qty 1

## 2021-11-06 MED ORDER — DOCUSATE SODIUM 100 MG PO CAPS
100.0000 mg | ORAL_CAPSULE | Freq: Two times a day (BID) | ORAL | Status: DC
  Administered 2021-11-06 – 2021-11-08 (×5): 100 mg via ORAL
  Filled 2021-11-06 (×6): qty 1

## 2021-11-06 MED ORDER — SODIUM CHLORIDE 0.9 % IV SOLN: INTRAVENOUS | Status: DC

## 2021-11-06 MED ORDER — MAGNESIUM HYDROXIDE 400 MG/5ML PO SUSP: 30.0000 mL | Freq: Every day | ORAL | Status: DC | PRN

## 2021-11-06 MED ORDER — ENSURE ENLIVE PO LIQD
237.0000 mL | Freq: Two times a day (BID) | ORAL | Status: DC
  Administered 2021-11-07 – 2021-11-08 (×3): 237 mL via ORAL
  Filled 2021-11-06 (×2): qty 237

## 2021-11-06 MED ORDER — ASCORBIC ACID 500 MG PO TABS
500.0000 mg | ORAL_TABLET | Freq: Every day | ORAL | Status: DC
  Administered 2021-11-07 – 2021-11-08 (×2): 500 mg via ORAL
  Filled 2021-11-06 (×3): qty 1

## 2021-11-06 MED ORDER — FENTANYL CITRATE (PF) 100 MCG/2ML IJ SOLN
INTRAMUSCULAR | Status: DC | PRN
  Administered 2021-11-06: 50 ug via INTRAVENOUS

## 2021-11-06 MED ORDER — FENTANYL CITRATE (PF) 100 MCG/2ML IJ SOLN
INTRAMUSCULAR | Status: AC
  Filled 2021-11-06: qty 2

## 2021-11-06 MED ORDER — SODIUM CHLORIDE 0.9 % IR SOLN
Status: DC | PRN
  Administered 2021-11-06: 200 mL

## 2021-11-06 MED ORDER — PHENYLEPHRINE HCL-NACL 20-0.9 MG/250ML-% IV SOLN
INTRAVENOUS | Status: DC | PRN
  Administered 2021-11-06: 30 ug/min via INTRAVENOUS

## 2021-11-06 MED ORDER — CHLORHEXIDINE GLUCONATE CLOTH 2 % EX PADS
6.0000 | MEDICATED_PAD | Freq: Every day | CUTANEOUS | Status: DC
  Administered 2021-11-06: 6 via TOPICAL

## 2021-11-06 MED ORDER — ENOXAPARIN SODIUM 30 MG/0.3ML IJ SOSY: 30.0000 mg | PREFILLED_SYRINGE | INTRAMUSCULAR | Status: DC

## 2021-11-06 MED ORDER — MELATONIN 5 MG PO TABS
5.0000 mg | ORAL_TABLET | Freq: Every day | ORAL | Status: DC
  Administered 2021-11-06 – 2021-11-08 (×3): 5 mg via ORAL
  Filled 2021-11-06 (×3): qty 1

## 2021-11-06 MED ORDER — SODIUM CHLORIDE 0.9 % IV SOLN: INTRAVENOUS | Status: AC

## 2021-11-06 MED ORDER — TORSEMIDE 20 MG PO TABS: 10.0000 mg | ORAL_TABLET | ORAL | Status: DC | PRN

## 2021-11-06 MED ORDER — CEFAZOLIN SODIUM-DEXTROSE 1-4 GM/50ML-% IV SOLN
1.0000 g | Freq: Once | INTRAVENOUS | Status: AC
  Administered 2021-11-06: 1 g via INTRAVENOUS

## 2021-11-06 MED ORDER — PHENYLEPHRINE HCL-NACL 20-0.9 MG/250ML-% IV SOLN
INTRAVENOUS | Status: AC
  Filled 2021-11-06: qty 250

## 2021-11-06 MED ORDER — TRANEXAMIC ACID-NACL 1000-0.7 MG/100ML-% IV SOLN
INTRAVENOUS | Status: AC
  Administered 2021-11-06: 1000 mg via INTRAVENOUS
  Filled 2021-11-06: qty 100

## 2021-11-06 MED ORDER — LACTATED RINGERS IV SOLN: INTRAVENOUS | Status: DC

## 2021-11-06 MED ORDER — AMLODIPINE BESYLATE 10 MG PO TABS
10.0000 mg | ORAL_TABLET | Freq: Every day | ORAL | Status: DC
  Administered 2021-11-06 – 2021-11-08 (×3): 10 mg via ORAL
  Filled 2021-11-06 (×3): qty 1

## 2021-11-06 MED ORDER — CEFAZOLIN SODIUM-DEXTROSE 1-4 GM/50ML-% IV SOLN
1.0000 g | Freq: Four times a day (QID) | INTRAVENOUS | Status: DC
  Filled 2021-11-06: qty 50

## 2021-11-06 MED ORDER — HEPARIN SODIUM (PORCINE) 5000 UNIT/ML IJ SOLN
5000.0000 [IU] | Freq: Two times a day (BID) | INTRAMUSCULAR | Status: DC
  Administered 2021-11-07 – 2021-11-09 (×5): 5000 [IU] via SUBCUTANEOUS
  Filled 2021-11-06 (×6): qty 1

## 2021-11-06 MED ORDER — HEPARIN SODIUM (PORCINE) 5000 UNIT/ML IJ SOLN
5000.0000 [IU] | Freq: Three times a day (TID) | INTRAMUSCULAR | Status: AC
  Administered 2021-11-06: 5000 [IU] via SUBCUTANEOUS
  Filled 2021-11-06: qty 1

## 2021-11-06 MED ORDER — LATANOPROST 0.005 % OP SOLN
1.0000 [drp] | Freq: Every day | OPHTHALMIC | Status: DC
  Administered 2021-11-06 – 2021-11-09 (×3): 1 [drp] via OPHTHALMIC
  Filled 2021-11-06: qty 2.5

## 2021-11-06 MED ORDER — PROPOFOL 500 MG/50ML IV EMUL
INTRAVENOUS | Status: DC | PRN
  Administered 2021-11-06: 50 ug/kg/min via INTRAVENOUS

## 2021-11-06 MED ORDER — PHENOL 1.4 % MT LIQD
1.0000 | OROMUCOSAL | Status: DC | PRN
  Filled 2021-11-06: qty 177

## 2021-11-06 MED ORDER — BUSPIRONE HCL 15 MG PO TABS
7.5000 mg | ORAL_TABLET | Freq: Two times a day (BID) | ORAL | Status: DC
  Administered 2021-11-06 – 2021-11-08 (×5): 7.5 mg via ORAL
  Filled 2021-11-06 (×8): qty 1

## 2021-11-06 MED ORDER — POLYSACCHARIDE IRON COMPLEX 150 MG PO CAPS
150.0000 mg | ORAL_CAPSULE | Freq: Every day | ORAL | Status: DC
  Administered 2021-11-07 – 2021-11-08 (×2): 150 mg via ORAL
  Filled 2021-11-06 (×4): qty 1

## 2021-11-06 MED ORDER — SODIUM CHLORIDE 0.9 % IV SOLN
1.0000 g | INTRAVENOUS | Status: DC
  Administered 2021-11-07 – 2021-11-08 (×2): 1 g via INTRAVENOUS
  Filled 2021-11-06: qty 10
  Filled 2021-11-06 (×2): qty 1

## 2021-11-06 MED ORDER — EPHEDRINE SULFATE 50 MG/ML IJ SOLN
INTRAMUSCULAR | Status: DC | PRN
  Administered 2021-11-06: 10 mg via INTRAVENOUS

## 2021-11-06 MED ORDER — MORPHINE SULFATE (PF) 2 MG/ML IV SOLN
INTRAVENOUS | Status: AC
  Filled 2021-11-06: qty 1

## 2021-11-06 MED ORDER — TRANEXAMIC ACID-NACL 1000-0.7 MG/100ML-% IV SOLN: 1000.0000 mg | Freq: Once | INTRAVENOUS | Status: AC

## 2021-11-06 MED ORDER — ALUM & MAG HYDROXIDE-SIMETH 200-200-20 MG/5ML PO SUSP: 30.0000 mL | ORAL | Status: DC | PRN

## 2021-11-06 MED ORDER — ONDANSETRON HCL 4 MG/2ML IJ SOLN
INTRAMUSCULAR | Status: DC | PRN
  Administered 2021-11-06: 4 mg via INTRAVENOUS

## 2021-11-06 MED ORDER — LORAZEPAM 1 MG PO TABS
0.5000 mg | ORAL_TABLET | Freq: Every day | ORAL | Status: DC
  Administered 2021-11-06 – 2021-11-08 (×3): 0.5 mg via ORAL
  Filled 2021-11-06 (×3): qty 1

## 2021-11-06 SURGICAL SUPPLY — 39 items
BIT DRILL CROWE POINT TWST 4.3 (DRILL) IMPLANT
BNDG COHESIVE 4X5 TAN ST LF (GAUZE/BANDAGES/DRESSINGS) ×4 IMPLANT
CHLORAPREP W/TINT 26 (MISCELLANEOUS) ×2 IMPLANT
DRAPE 3/4 80X56 (DRAPES) ×2 IMPLANT
DRAPE U-SHAPE 47X51 STRL (DRAPES) ×2 IMPLANT
DRILL CROWE POINT TWIST 4.3 (DRILL) ×2
DRSG OPSITE POSTOP 3X4 (GAUZE/BANDAGES/DRESSINGS) ×4 IMPLANT
DRSG OPSITE POSTOP 4X6 (GAUZE/BANDAGES/DRESSINGS) ×2 IMPLANT
GAUZE 4X4 16PLY ~~LOC~~+RFID DBL (SPONGE) ×1 IMPLANT
GLOVE SURG SYN 9.0  PF PI (GLOVE) ×1
GLOVE SURG SYN 9.0 PF PI (GLOVE) ×1 IMPLANT
GLOVE SURG UNDER POLY LF SZ9 (GLOVE) ×2 IMPLANT
GOWN SRG 2XL LVL 4 RGLN SLV (GOWNS) ×1 IMPLANT
GOWN STRL NON-REIN 2XL LVL4 (GOWNS) ×1
GOWN STRL REUS W/ TWL LRG LVL3 (GOWN DISPOSABLE) ×1 IMPLANT
GOWN STRL REUS W/TWL LRG LVL3 (GOWN DISPOSABLE) ×1
GUIDEPIN VERSANAIL DSP 3.2X444 (ORTHOPEDIC DISPOSABLE SUPPLIES) ×1 IMPLANT
GUIDEWIRE NATURAL NAIL 3X100 (WIRE) ×1 IMPLANT
HIP FRA NAIL LAG SCREW 10.5X90 (Orthopedic Implant) ×2 IMPLANT
KIT TURNOVER KIT A (KITS) ×2 IMPLANT
MANIFOLD NEPTUNE II (INSTRUMENTS) ×2 IMPLANT
MAT ABSORB  FLUID 56X50 GRAY (MISCELLANEOUS) ×1
MAT ABSORB FLUID 56X50 GRAY (MISCELLANEOUS) ×1 IMPLANT
NAIL AFFIXUS RT 11X340MM (Nail) ×1 IMPLANT
NDL FILTER BLUNT 18X1 1/2 (NEEDLE) ×1 IMPLANT
NEEDLE FILTER BLUNT 18X 1/2SAF (NEEDLE) ×1
NEEDLE FILTER BLUNT 18X1 1/2 (NEEDLE) ×1 IMPLANT
NS IRRIG 500ML POUR BTL (IV SOLUTION) ×2 IMPLANT
PACK HIP COMPR (MISCELLANEOUS) ×2 IMPLANT
SCALPEL PROTECTED #15 DISP (BLADE) ×4 IMPLANT
SCREW BONE CORTICAL 5.0X38 (Screw) ×1 IMPLANT
SCREW LAG HIP FRA NAIL 10.5X90 (Orthopedic Implant) IMPLANT
SPONGE T-LAP 18X18 ~~LOC~~+RFID (SPONGE) ×4 IMPLANT
STAPLER SKIN PROX 35W (STAPLE) ×2 IMPLANT
SUT VIC AB 1 CT1 36 (SUTURE) ×2 IMPLANT
SUT VIC AB 2-0 CT1 (SUTURE) ×2 IMPLANT
SYR 10ML LL (SYRINGE) ×2 IMPLANT
SYR BULB IRRIG 60ML STRL (SYRINGE) ×2 IMPLANT
WATER STERILE IRR 1000ML POUR (IV SOLUTION) ×1 IMPLANT

## 2021-11-06 NOTE — Anesthesia Procedure Notes (Addendum)
Spinal  Patient location during procedure: OR Start time: 11/12/2021 11:41 AM End time: 11/18/2021 11:44 AM Reason for block: surgical anesthesia Staffing Performed: resident/CRNA  Anesthesiologist: Iran Ouch, MD Resident/CRNA: Lia Foyer, CRNA Preanesthetic Checklist Completed: patient identified, IV checked, site marked, risks and benefits discussed, surgical consent, monitors and equipment checked, pre-op evaluation and timeout performed Spinal Block Patient position: left lateral decubitus Prep: DuraPrep Patient monitoring: heart rate, cardiac monitor, continuous pulse ox and blood pressure Approach: midline Location: L3-4 Injection technique: single-shot Needle Needle type: Pencan  Needle gauge: 25 G Needle length: 9 cm Assessment Sensory level: T4 Events: CSF return

## 2021-11-06 NOTE — Anesthesia Postprocedure Evaluation (Signed)
Anesthesia Post Note  Patient: April Green  Procedure(s) Performed: INTRAMEDULLARY (IM) NAIL INTERTROCHANTRIC (Right)  Patient location during evaluation: PACU Anesthesia Type: Spinal Level of consciousness: awake and alert Pain management: pain level controlled Vital Signs Assessment: post-procedure vital signs reviewed and stable Respiratory status: spontaneous breathing, nonlabored ventilation, respiratory function stable and patient connected to nasal cannula oxygen Cardiovascular status: blood pressure returned to baseline and stable Postop Assessment: no apparent nausea or vomiting Anesthetic complications: no   No notable events documented.   Last Vitals:  Vitals:   11/04/2021 1345 11/18/2021 1400  BP: (!) 133/58 134/68  Pulse: 76 77  Resp: 14 16  Temp: 36.7 C 36.7 C  SpO2: 93% 100%    Last Pain:  Vitals:   12/01/2021 1400  TempSrc: Oral  PainSc: 0-No pain                 Iran Ouch

## 2021-11-06 NOTE — Op Note (Signed)
11/27/2021  12:29 PM  PATIENT:  April Green  88 y.o. female  PRE-OPERATIVE DIAGNOSIS:  Right proximal femur fracture  POST-OPERATIVE DIAGNOSIS:  Right proximal femur fracture  PROCEDURE:  Procedure(s): INTRAMEDULLARY (IM) NAIL INTERTROCHANTRIC (Right)  SURGEON: Laurene Footman, MD  ASSISTANTS: None  ANESTHESIA:   spinal  EBL:  Total I/O In: 50 [IV Piggyback:50] Out: -   BLOOD ADMINISTERED:none  DRAINS: none   LOCAL MEDICATIONS USED:  NONE  SPECIMEN:  No Specimen  DISPOSITION OF SPECIMEN:  N/A  COUNTS:  YES  TOURNIQUET:  * No tourniquets in log *  IMPLANTS: Biomet affixes 11 x 340 rod right with 90 mm leg screw and 38 mm distal interlocking screw  DICTATION: .Dragon Dictation patient was brought to the operating room and after spinal anesthesia was obtained the patient was transferred to the fracture table with the left leg and while holder traction applied to the right leg.  C-arm was brought in and anatomic alignment was obtained with closed means.  Next the hip was prepped and draped using barrier drape method.  Appropriate patient identification and timeout procedures were carried out.  A small incision made proximally and a guidewire inserted the tip of the trochanter and proximal reaming carried out placement of the long guidewire was inserted placed x-ray at the knee taken and length determined from guidewire.  Reaming was carried out to 13 mm and the appropriate sized rod was then inserted down the canal to the appropriate level.  Small lateral incision made and subcutaneous tissue spread with the guidewires inserted into a center center position followed by measurement drilling and placing the 90 mm screw.  Proximal setscrew was tightened and then quarter turn off to allow for further compression compression device applied after traction released from the foot.  The distal interlocking screw was inserted after removal of the proximal insertion handle and oblique screw  hole filled with a single screw using standard technique with small incision made drilling and measuring in the 5.0 cortical screw.  Permanent C arm views were obtained.  Wounds were irrigated and closed with #1 Vicryl for the deep fascia 2-0 Vicryl subcutaneously and skin staples followed by Xeroform and honeycomb dressings.  PLAN OF CARE: Admit to inpatient   PATIENT DISPOSITION:  PACU - hemodynamically stable.

## 2021-11-06 NOTE — Consult Note (Signed)
Reason for Consult: Right hip fracture Referring Physician: Dr. Delrae Rend April Green is an 88 y.o. female.  HPI: Patient is a 88 year old [facility] who normally gets around well with a walker.  She suffered a fall last night with right hip pain and was found to have a displaced proximal femur fracture.  No prodromal symptoms and normally no complaints of hip pain.  No loss of consciousness with this  Past Medical History:  Diagnosis Date   Acute cystitis    Anxiety    Cancer ([place])    skin cancer   Chronic UTI    Depression    Dyspnea    Dysrhythmia    tachy arrythmia   Glaucoma    History of kidney stones    Hydronephrosis    stage IV ckd   Hypertension    Incomplete bladder emptying    Macular degeneration    PONV (postoperative nausea and vomiting)    nausea only   Renal cyst    SUI (stress urinary incontinence, female)    Unsteady gait    UPJ obstruction, congenital    Vertigo    Weight loss, unintentional 04/2021   having difficulty keeping weight on    Past Surgical History:  Procedure Laterality Date   ABDOMINAL HYSTERECTOMY     cysto stent exchange     once a year for last 6 years. metal stent   CYSTOSCOPY W/ RETROGRADES Right 02/16/2018   Procedure: CYSTOSCOPY WITH RETROGRADE PYELOGRAM;  Surgeon: Hollice Espy, MD;  Location: [facility];  Service: Urology;  Laterality: Right;   CYSTOSCOPY W/ RETROGRADES Right 04/24/2020   Procedure: CYSTOSCOPY WITH RETROGRADE PYELOGRAM;  Surgeon: Hollice Espy, MD;  Location: [facility];  Service: Urology;  Laterality: Right;   CYSTOSCOPY W/ RETROGRADES Right 04/16/2021   Procedure: CYSTOSCOPY WITH RETROGRADE PYELOGRAM;  Surgeon: Hollice Espy, MD;  Location: [facility];  Service: Urology;  Laterality: Right;   CYSTOSCOPY W/ URETERAL STENT PLACEMENT Right 01/01/2016   Procedure: CYSTOSCOPY WITH STENT REPLACEMENT;  Surgeon: Hollice Espy, MD;  Location: [facility];  Service: Urology;  Laterality: Right;   CYSTOSCOPY W/  URETERAL STENT PLACEMENT Right 02/18/2017   Procedure: CYSTOSCOPY WITH STENT REPLACEMENT;  Surgeon: Hollice Espy, MD;  Location: [facility];  Service: Urology;  Laterality: Right;   CYSTOSCOPY W/ URETERAL STENT PLACEMENT Right 02/16/2018   Procedure: CYSTOSCOPY WITH STENT REPLACEMENT;  Surgeon: Hollice Espy, MD;  Location: [facility];  Service: Urology;  Laterality: Right;   CYSTOSCOPY W/ URETERAL STENT PLACEMENT Right 06/07/2019   Procedure: CYSTOSCOPY WITH STENT REPLACEMENT;  Surgeon: Hollice Espy, MD;  Location: [facility];  Service: Urology;  Laterality: Right;   CYSTOSCOPY W/ URETERAL STENT PLACEMENT Right 04/24/2020   Procedure: CYSTOSCOPY WITH STENT REPLACEMENT;  Surgeon: Hollice Espy, MD;  Location: [facility];  Service: Urology;  Laterality: Right;  metal stent   CYSTOSCOPY W/ URETERAL STENT PLACEMENT Right 04/16/2021   Procedure: CYSTOSCOPY WITH STENT REPLACEMENT;  Surgeon: Hollice Espy, MD;  Location: [facility];  Service: Urology;  Laterality: Right;   EYE SURGERY Bilateral    cataract extractions   HERNIA REPAIR Left 05/2008   inguinal hernia    Family History  Problem Relation Age of Onset   Kidney disease Neg Hx    Bladder Cancer Neg Hx    Breast cancer Neg Hx     Social History:  reports that she has never smoked. She has never used smokeless tobacco. She reports that she does not drink alcohol and  does not use drugs.  Allergies:  Allergies  Allergen Reactions   Sulfa Antibiotics Anaphylaxis   Amoxicillin-Pot Clavulanate Diarrhea and Nausea Only    Stomach pains   Azithromycin Nausea Only and Other (See Comments)    Confusion also   Diphenhydramine Other (See Comments)    Confusion   Trazodone And Nefazodone Other (See Comments)    Disorientation and sleep deprivation   Lokelma [Sodium Zirconium Cyclosilicate] Other (See Comments)    edema   5-Methoxypsoralen Other (See Comments)    Altered mental status Used to treat psoriasis and/or vitiligo   Aspirin Other  (See Comments)    EARS START TO RING   Ceftriaxone Nausea Only   Flexeril [Cyclobenzaprine] Nausea Only   Levofloxacin Other (See Comments)    Causes pain in calf of leg when walking  Levaquin   Mirtazapine Other (See Comments)    Dizzy   Oxycodone Other (See Comments)    Causes heavy drowsiness    Medications: I have reviewed the patient's current medications.  Results for orders placed or performed during the hospital encounter of 11/26/2021 (from the past 48 hour(s))  Comprehensive metabolic panel     Status: Abnormal   Collection Time: 11/14/2021 11:51 PM  Result Value Ref Range   Sodium 138 135 - 145 mmol/L   Potassium 5.2 (H) 3.5 - 5.1 mmol/L   Chloride 107 98 - 111 mmol/L   CO2 22 22 - 32 mmol/L   Glucose, Bld 116 (H) 70 - 99 mg/dL    Comment: Glucose reference range applies only to samples taken after fasting for at least 8 hours.   BUN 64 (H) 8 - 23 mg/dL   Creatinine, Ser 2.67 (H) 0.44 - 1.00 mg/dL   Calcium 9.0 8.9 - 10.3 mg/dL   Total Protein 7.1 6.5 - 8.1 g/dL   Albumin 3.7 3.5 - 5.0 g/dL   AST 19 15 - 41 U/L   ALT 18 0 - 44 U/L   Alkaline Phosphatase 135 (H) 38 - 126 U/L   Total Bilirubin 0.5 0.3 - 1.2 mg/dL   GFR, Estimated 16 (L) >60 mL/min    Comment: (NOTE) Calculated using the CKD-EPI Creatinine Equation (2021)    Anion gap 9 5 - 15    Comment: Performed at [facility], [address] [place], [place] [address]  CBC with Differential     Status: Abnormal   Collection Time: 11/27/2021 11:51 PM  Result Value Ref Range   WBC 13.2 (H) 4.0 - 10.5 K/uL   RBC 3.06 (L) 3.87 - 5.11 MIL/uL   Hemoglobin 9.6 (L) 12.0 - 15.0 g/dL   HCT 29.9 (L) 36.0 - 46.0 %   MCV 97.7 80.0 - 100.0 fL   MCH 31.4 26.0 - 34.0 pg   MCHC 32.1 30.0 - 36.0 g/dL   RDW 13.4 11.5 - 15.5 %   Platelets 384 150 - 400 K/uL   nRBC 0.0 0.0 - 0.2 %   Neutrophils Relative % 86 %   Neutro Abs 11.4 (H) 1.7 - 7.7 K/uL   Lymphocytes Relative 6 %   Lymphs Abs 0.8 0.7 - 4.0 K/uL    Monocytes Relative 5 %   Monocytes Absolute 0.7 0.1 - 1.0 K/uL   Eosinophils Relative 1 %   Eosinophils Absolute 0.2 0.0 - 0.5 K/uL   Basophils Relative 1 %   Basophils Absolute 0.1 0.0 - 0.1 K/uL   Immature Granulocytes 1 %   Abs Immature Granulocytes 0.06 0.00 -  0.07 K/uL    Comment: Performed at [facility], [address] [place], [place] [address]  Resp Panel by RT-PCR (Flu A&B, Covid) Nasopharyngeal Swab     Status: None   Collection Time: 11/21/2021 11:51 PM   Specimen: Nasopharyngeal Swab; Nasopharyngeal(NP) swabs in vial transport medium  Result Value Ref Range   SARS Coronavirus 2 by RT PCR NEGATIVE NEGATIVE    Comment: (NOTE) SARS-CoV-2 target nucleic acids are NOT DETECTED.  The SARS-CoV-2 RNA is generally detectable in upper respiratory specimens during the acute phase of infection. The lowest concentration of SARS-CoV-2 viral copies this assay can detect is 138 copies/mL. A negative result does not preclude SARS-Cov-2 infection and should not be used as the sole basis for treatment or other patient management decisions. A negative result may occur with  improper specimen collection/handling, submission of specimen other than nasopharyngeal swab, presence of viral mutation(s) within the areas targeted by this assay, and inadequate number of viral copies(<138 copies/mL). A negative result must be combined with clinical observations, patient history, and epidemiological information. The expected result is Negative.  Fact Sheet for Patients:  EntrepreneurPulse.com.au  Fact Sheet for Healthcare Providers:  IncredibleEmployment.be  This test is no t yet approved or cleared by the [place] FDA and  has been authorized for detection and/or diagnosis of SARS-CoV-2 by FDA under an Emergency Use Authorization (EUA). This EUA will remain  in effect (meaning this test can be used) for the duration of the COVID-19 declaration  under Section 564(b)(1) of the Act, 21 U.S.C.section 360bbb-3(b)(1), unless the authorization is terminated  or revoked sooner.       Influenza A by PCR NEGATIVE NEGATIVE   Influenza B by PCR NEGATIVE NEGATIVE    Comment: (NOTE) The Xpert Xpress SARS-CoV-2/FLU/RSV plus assay is intended as an aid in the diagnosis of influenza from Nasopharyngeal swab specimens and should not be used as a sole basis for treatment. Nasal washings and aspirates are unacceptable for Xpert Xpress SARS-CoV-2/FLU/RSV testing.  Fact Sheet for Patients: EntrepreneurPulse.com.au  Fact Sheet for Healthcare Providers: IncredibleEmployment.be  This test is not yet approved or cleared by the [place] FDA and has been authorized for detection and/or diagnosis of SARS-CoV-2 by FDA under an Emergency Use Authorization (EUA). This EUA will remain in effect (meaning this test can be used) for the duration of the COVID-19 declaration under Section 564(b)(1) of the Act, 21 U.S.C. section 360bbb-3(b)(1), unless the authorization is terminated or revoked.  Performed at [facility], [address] [place], [place] [address]   Protime-INR     Status: None   Collection Time: 11/14/2021 11:51 PM  Result Value Ref Range   Prothrombin Time 15.0 11.4 - 15.2 seconds   INR 1.2 0.8 - 1.2    Comment: (NOTE) INR goal varies based on device and disease states. Performed at [facility], [address] [place], [place] [address]   Type and screen [place]     Status: None   Collection Time: 12/03/2021 11:51 PM  Result Value Ref Range   ABO/RH(D) B NEG    Antibody Screen NEG    Sample Expiration      11/08/2021,2359 Performed at [facility], [address] [place], [place] [address]   Urinalysis, Complete w Microscopic     Status: Abnormal   Collection Time: 11/27/2021  3:11 AM  Result Value Ref Range   Color, Urine YELLOW  (A) YELLOW   APPearance HAZY (A) CLEAR  Specific Gravity, Urine 1.010 1.005 - 1.030   pH 5.0 5.0 - 8.0   Glucose, UA NEGATIVE NEGATIVE mg/dL   Hgb urine dipstick SMALL (A) NEGATIVE   Bilirubin Urine NEGATIVE NEGATIVE   Ketones, ur NEGATIVE NEGATIVE mg/dL   Protein, ur 100 (A) NEGATIVE mg/dL   Nitrite NEGATIVE NEGATIVE   Leukocytes,Ua LARGE (A) NEGATIVE   RBC / HPF 11-20 0 - 5 RBC/hpf   WBC, UA >50 (H) 0 - 5 WBC/hpf   Bacteria, UA MANY (A) NONE SEEN   Squamous Epithelial / LPF 0-5 0 - 5    Comment: Performed at [facility], [address] [place], [place] [address]  Basic metabolic panel     Status: Abnormal   Collection Time: 11/07/2021  5:54 AM  Result Value Ref Range   Sodium 140 135 - 145 mmol/L   Potassium 5.0 3.5 - 5.1 mmol/L   Chloride 111 98 - 111 mmol/L   CO2 22 22 - 32 mmol/L   Glucose, Bld 121 (H) 70 - 99 mg/dL    Comment: Glucose reference range applies only to samples taken after fasting for at least 8 hours.   BUN 56 (H) 8 - 23 mg/dL   Creatinine, Ser 2.38 (H) 0.44 - 1.00 mg/dL   Calcium 8.8 (L) 8.9 - 10.3 mg/dL   GFR, Estimated 18 (L) >60 mL/min    Comment: (NOTE) Calculated using the CKD-EPI Creatinine Equation (2021)    Anion gap 7 5 - 15    Comment: Performed at [facility], [address] [place], [place] [address]  CBC     Status: Abnormal   Collection Time: 11/20/2021  5:54 AM  Result Value Ref Range   WBC 11.4 (H) 4.0 - 10.5 K/uL   RBC 2.62 (L) 3.87 - 5.11 MIL/uL   Hemoglobin 8.2 (L) 12.0 - 15.0 g/dL   HCT 25.5 (L) 36.0 - 46.0 %   MCV 97.3 80.0 - 100.0 fL   MCH 31.3 26.0 - 34.0 pg   MCHC 32.2 30.0 - 36.0 g/dL   RDW 13.5 11.5 - 15.5 %   Platelets 318 150 - 400 K/uL   nRBC 0.0 0.0 - 0.2 %    Comment: Performed at [facility], [address], [place], [place] [address]    DG Shoulder Right  Result Date: 12/02/2021 CLINICAL DATA:  Status post fall. EXAM: RIGHT SHOULDER - 2+ VIEW COMPARISON:  None. FINDINGS: There is  no evidence of acute fracture or dislocation. Mild chronic changes seen along the greater tubercle of the right humeral head. Mild degenerative changes are seen involving the right glenohumeral articulation. Soft tissues are unremarkable. IMPRESSION: 1. No acute fracture or dislocation. 2. Mild degenerative changes. Electronically Signed   By: Virgina Norfolk M.D.   On: 11/08/2021 22:31   CT Head Wo Contrast  Result Date: 11/22/2021 CLINICAL DATA:  Status post fall. EXAM: CT HEAD WITHOUT CONTRAST TECHNIQUE: Contiguous axial images were obtained from the base of the skull through the vertex without intravenous contrast. COMPARISON:  None. FINDINGS: Brain: There is mild to moderate severity cerebral atrophy with widening of the extra-axial spaces and ventricular dilatation. There are areas of decreased attenuation within the white matter tracts of the supratentorial brain, consistent with microvascular disease changes. Vascular: No hyperdense vessel or unexpected calcification. Skull: Normal. Negative for fracture or focal lesion. Sinuses/Orbits: No acute finding. Other: None. IMPRESSION: 1. Generalized cerebral atrophy and microvascular disease changes of the supratentorial brain. 2. No acute intracranial  abnormality. Electronically Signed   By: Virgina Norfolk M.D.   On: 11/16/2021 22:00   CT Cervical Spine Wo Contrast  Result Date: 11/18/2021 CLINICAL DATA:  Status post fall. EXAM: CT CERVICAL SPINE WITHOUT CONTRAST TECHNIQUE: Multidetector CT imaging of the cervical spine was performed without intravenous contrast. Multiplanar CT image reconstructions were also generated. COMPARISON:  October 03, 2021 FINDINGS: Alignment: There is approximately 2 mm anterolisthesis of the C7 vertebral body on T1. Skull base and vertebrae: No acute fracture. No primary bone lesion or focal pathologic process. Soft tissues and spinal canal: No prevertebral fluid or swelling. No visible canal hematoma. Disc levels: Mild  multilevel endplate sclerosis is seen throughout the cervical spine. This is most prominent at the levels of C3-C4, C4-C5 and C5-C6. There is marked severity narrowing of the anterior atlantoaxial articulation. Marked severity posterior intervertebral disc space narrowing is seen at the levels of C2-C3, C3-C4, C4-C5, C5-C6 and C6-C7. Bilateral moderate to marked severity multilevel facet joint hypertrophy is noted. Upper chest: Negative. Other: None. IMPRESSION: 1. No acute fracture within the cervical spine. 2. Marked severity multilevel degenerative changes, most prominent at the levels of C2-C3, C3-C4, C4-C5, C5-C6 and C6-C7. Electronically Signed   By: Virgina Norfolk M.D.   On: 11/22/2021 22:04   DG Chest Portable 1 View  Result Date: 11/29/2021 CLINICAL DATA:  Preop.  Right hip fracture.  Mechanical fall. EXAM: PORTABLE CHEST 1 VIEW COMPARISON:  Radiograph 10/03/2021 FINDINGS: The cardiomediastinal contours are normal. Atherosclerosis of the aortic arch. Borderline hyperinflation which is similar to prior exam. Pulmonary vasculature is normal. No consolidation, pleural effusion, or pneumothorax. No acute osseous abnormalities are seen. IMPRESSION: No acute chest findings. Electronically Signed   By: Keith Rake M.D.   On: 11/27/2021 23:30   DG Hip Unilat W or Wo Pelvis 2-3 Views Right  Result Date: 11/11/2021 CLINICAL DATA:  Post fall with right hip pain. Tripped while walking with walker. EXAM: DG HIP (WITH OR WITHOUT PELVIS) 2-3V RIGHT COMPARISON:  Pelvis and left hip radiograph 01/01/2019 FINDINGS: Acute intertrochanteric right proximal femur fracture. There is fracture involvement of the greater and lesser trochanters with minimal proximal migration of the femoral shaft. Femoral head remains seated in the acetabulum. Mild chronic regularity of the right pubic body. The bones are diffusely under mineralized. Right-sided double-J stent in place, the proximal pigtail is at the level of L4, the  distal pigtail in the left pelvis. IMPRESSION: 1. Acute intertrochanteric right proximal femur fracture with minimal proximal migration of the femoral shaft. 2. Right double-J stent in place, proximal pigtail at the level of L4, possibly in the ureter, though not well delineated by radiograph. Electronically Signed   By: Keith Rake M.D.   On: 12/02/2021 22:34   DG FEMUR, MIN 2 VIEWS RIGHT  Result Date: 11/04/2021 CLINICAL DATA:  Post fall with right hip pain. Tripped while walking with walker. EXAM: RIGHT FEMUR 2 VIEWS COMPARISON:  None. FINDINGS: Comminuted proximal femur fracture with inter trochanteric involvement, fracture involving both lesser and greater trochanters. Mild proximal migration of the femoral shaft. There is nondisplaced subtrochanteric involvement through the central aspect of the proximal femoral shaft approaching the medial cortex. The more distal femur is intact. Soft tissues appear a trophic. IMPRESSION: Comminuted displaced intertrochanteric proximal femur fracture. There is nondisplaced subtrochanteric involvement through the central aspect of the proximal femoral shaft. Electronically Signed   By: Keith Rake M.D.   On: 11/29/2021 22:36    Review of Systems Blood pressure Marland Kitchen)  158/69, pulse 98, temperature 98.1 F (36.7 C), resp. rate 19, height 5\' 1"  (1.549 m), weight 36.3 kg, SpO2 97 %. Physical Exam Right leg is shortened and externally rotated with palpable dorsalis pedis and posterior tib pulses.  Skin is intact around the hip.  She is able flex and extend the toes. Assessment/Plan: Displaced comminuted right intertrochanteric hip fracture and ambulatory 88 year old.  Plan is for ORIF later today.  Risk benefits possible complications discussed  Hessie Knows 11/27/2021, 7:08 AM

## 2021-11-06 NOTE — Plan of Care (Signed)

## 2021-11-06 NOTE — H&P (Signed)
History and Physical   April Green ZTI:[id number] DOB: 1923-08-16 DOA: 12/04/2021  PCP: [name], MD  Patient coming from: [facility]  I have personally briefly reviewed patient's old medical records in [place].  Chief Concern: Fall and right hip pain  HPI: April Green is a 88 y.o. female with medical history significant for hypertension, depression, anxiety, insomnia, hard of hearing, who presents to the emergency department for chief concerns of right hip pain.  At bedside, she is able to tell me her name, age, and she knows she is the hospital.  She reports that she was using her walker, when the walker 'got away from me' and her feet got caught under the walker and she fell onto her right side. She denies known head trauma. She denies chest pain, shortness of breath, dysuria.   Social history: She is from [place]. She formerly worked in a Control and instrumentation engineer. She formerly used tobacco and quit more than 30 years ago. She denies etoh and recreational drug use.   Vaccination history: She is vaccinated for covid 19  ROS: Constitutional: no weight change, no fever ENT/Mouth: no sore throat, no rhinorrhea Eyes: no eye pain, no vision changes Cardiovascular: no chest pain, no dyspnea,  no edema, no palpitations Respiratory: no cough, no sputum, no wheezing Gastrointestinal: no nausea, no vomiting, no diarrhea, no constipation Genitourinary: no urinary incontinence, no dysuria, no hematuria Musculoskeletal: no arthralgias, no myalgias, + right hip pain, hurts with knee bending Skin: no skin lesions, no pruritus, Neuro: + weakness, no loss of consciousness, no syncope Psych: no anxiety, no depression, + decrease appetite Heme/Lymph: no bruising, no bleeding  ED Course: Discussed with EDP, patient requiring hospitalization for chief concerns of right proximal femur fracture.   Vitals in the ED was stable with BP of 152/69. In the ED, she was given one dose  of fentanyl 25 mcg IVP.   Labs in the ED showed K of 5.2, nonfasting glucose of 116, bun 64, sCr of 2.67, gfr 16, wbc of 13.2, hgb 9.6, platelets of 384.   Assessment/Plan  Principal Problem:   Hip fracture (HCC) Active Problems:   UPJ obstruction, congenital   Essential hypertension   Leukocytosis   Protein-calorie malnutrition, severe   CKD (chronic kidney disease) stage 4, GFR 15-29 ml/min (HCC)   # Right comminuted displaced intertrochanteric proximal femur fracture - presumed secondary to mechanical fall - pain control - Admit to inp, med surg, no telemetry  # Leukocytosis - etiology work up - Check UA  # HTN - resumed amlodipine 10 mg qhs - Hydralazine 5 mg IV q6h prn for sbp > 180  # CKD4 - at baseline   Pending med reconciliation  DVT prophylaxis - Primary team to resume dvt prophylaxis when appropriate - I have only ordered 1 dose of heparin 5000 units   Chart reviewed.   DVT prophylaxis: Heparin 5000 units, one dose ordered Code Status: DNR/DNI Diet: NPO Family Communication: Updated niece at bedside Disposition Plan: pending ortho evaluation Consults called: ortho service Admission statusinpt, med surg  Past Medical History:  Diagnosis Date   Acute cystitis    Anxiety    Cancer ([place])    skin cancer   Chronic UTI    Depression    Dyspnea    Dysrhythmia    tachy arrythmia   Glaucoma    History of kidney stones    Hydronephrosis    stage IV ckd   Hypertension  Incomplete bladder emptying    Macular degeneration    PONV (postoperative nausea and vomiting)    nausea only   Renal cyst    SUI (stress urinary incontinence, female)    Unsteady gait    UPJ obstruction, congenital    Vertigo    Weight loss, unintentional 04/2021   having difficulty keeping weight on   Past Surgical History:  Procedure Laterality Date   ABDOMINAL HYSTERECTOMY     cysto stent exchange     once a year for last 6 years. metal stent   CYSTOSCOPY W/ RETROGRADES  Right 02/16/2018   Procedure: CYSTOSCOPY WITH RETROGRADE PYELOGRAM;  Surgeon: Hollice Espy, MD;  Location: [facility] ORS;  Service: Urology;  Laterality: Right;   CYSTOSCOPY W/ RETROGRADES Right 04/24/2020   Procedure: CYSTOSCOPY WITH RETROGRADE PYELOGRAM;  Surgeon: Hollice Espy, MD;  Location: [facility];  Service: Urology;  Laterality: Right;   CYSTOSCOPY W/ RETROGRADES Right 04/16/2021   Procedure: CYSTOSCOPY WITH RETROGRADE PYELOGRAM;  Surgeon: Hollice Espy, MD;  Location: [facility];  Service: Urology;  Laterality: Right;   CYSTOSCOPY W/ URETERAL STENT PLACEMENT Right 01/01/2016   Procedure: CYSTOSCOPY WITH STENT REPLACEMENT;  Surgeon: Hollice Espy, MD;  Location: [facility];  Service: Urology;  Laterality: Right;   CYSTOSCOPY W/ URETERAL STENT PLACEMENT Right 02/18/2017   Procedure: CYSTOSCOPY WITH STENT REPLACEMENT;  Surgeon: Hollice Espy, MD;  Location: [facility];  Service: Urology;  Laterality: Right;   CYSTOSCOPY W/ URETERAL STENT PLACEMENT Right 02/16/2018   Procedure: CYSTOSCOPY WITH STENT REPLACEMENT;  Surgeon: Hollice Espy, MD;  Location: [facility];  Service: Urology;  Laterality: Right;   CYSTOSCOPY W/ URETERAL STENT PLACEMENT Right 06/07/2019   Procedure: CYSTOSCOPY WITH STENT REPLACEMENT;  Surgeon: Hollice Espy, MD;  Location: [facility];  Service: Urology;  Laterality: Right;   CYSTOSCOPY W/ URETERAL STENT PLACEMENT Right 04/24/2020   Procedure: CYSTOSCOPY WITH STENT REPLACEMENT;  Surgeon: Hollice Espy, MD;  Location: [facility];  Service: Urology;  Laterality: Right;  metal stent   CYSTOSCOPY W/ URETERAL STENT PLACEMENT Right 04/16/2021   Procedure: CYSTOSCOPY WITH STENT REPLACEMENT;  Surgeon: Hollice Espy, MD;  Location: [facility];  Service: Urology;  Laterality: Right;   EYE SURGERY Bilateral    cataract extractions   HERNIA REPAIR Left 05/2008   inguinal hernia   Social History:  reports that she has never smoked. She has never used smokeless tobacco. She reports that she  does not drink alcohol and does not use drugs.  Allergies  Allergen Reactions   Sulfa Antibiotics Anaphylaxis   Amoxicillin-Pot Clavulanate Diarrhea and Nausea Only    Stomach pains   Azithromycin Nausea Only and Other (See Comments)    Confusion also   Diphenhydramine Other (See Comments)    Confusion   Trazodone And Nefazodone Other (See Comments)    Disorientation and sleep deprivation   Lokelma [Sodium Zirconium Cyclosilicate] Other (See Comments)    edema   5-Methoxypsoralen Other (See Comments)    Altered mental status Used to treat psoriasis and/or vitiligo   Aspirin Other (See Comments)    EARS START TO RING   Ceftriaxone Nausea Only   Flexeril [Cyclobenzaprine] Nausea Only   Levofloxacin Other (See Comments)    Causes pain in calf of leg when walking  Levaquin   Mirtazapine Other (See Comments)    Dizzy   Oxycodone Other (See Comments)    Causes heavy drowsiness   Family History  Problem Relation Age of Onset   Kidney disease Neg Hx  Bladder Cancer Neg Hx    Breast cancer Neg Hx    Family history: Family history reviewed and not pertinent.  Prior to Admission medications   Medication Sig Start Date End Date Taking? Authorizing Provider  acetaminophen (TYLENOL) 325 MG tablet Take 325-650 mg by mouth every 6 (six) hours as needed for moderate pain.    [provider]  amLODipine (NORVASC) 10 MG tablet Take 10 mg by mouth at bedtime.  03/06/15   [provider]  busPIRone (BUSPAR) 7.5 MG tablet Take 7.5 mg by mouth 2 (two) times daily.    [provider]  estradiol (ESTRACE VAGINAL) 0.1 MG/GM vaginal cream Apply 0.5mg  (pea-sized amount)  just inside the vaginal introitus with a finger-tip every night for two weeks and then Monday, Wednesday and Friday nights. 02/24/20   Hollice Espy, MD  latanoprost (XALATAN) 0.005 % ophthalmic solution Place 1 drop into both eyes at bedtime.     [provider]  LORazepam (ATIVAN) 0.5 MG  tablet Take 0.5 mg by mouth at bedtime.    [provider]  Melatonin 5 MG CAPS Take 5 mg by mouth at bedtime.  12/03/19   [provider]  Multiple Vitamins-Minerals (PRESERVISION AREDS 2+MULTI VIT) CAPS Take 1 capsule by mouth 2 (two) times daily.    [provider]  torsemide (DEMADEX) 10 MG tablet Take 1 tablet (10 mg total) by mouth every other day. 10/20/21   Mercy Riding, MD   Physical Exam: Vitals:   12/02/2021 2126 11/27/2021 2129  BP:  (!) 152/69  Pulse:  87  Resp:  20  Temp:  98 F (36.7 C)  TempSrc:  Oral  SpO2:  96%  Weight: 36.3 kg   Height: 5\' 1"  (1.549 m)    Constitutional: appears age appropriate, frail, NAD, calm, comfortable Eyes: PERRL, lids and conjunctivae normal ENMT: Mucous membranes are moist. Posterior pharynx clear of any exudate or lesions. Age-appropriate dentition. Moderate-severe hearing loss Neck: normal, supple, no masses, no thyromegaly Respiratory: clear to auscultation bilaterally, no wheezing, no crackles. Normal respiratory effort. No accessory muscle use.  Cardiovascular: Regular rate and rhythm, no murmurs / rubs / gallops. No extremity edema. 2+ pedal pulses. No carotid bruits.  Abdomen: no tenderness, no masses palpated, no hepatosplenomegaly. Bowel sounds positive.  Musculoskeletal: no clubbing / cyanosis. No joint deformity upper and lower extremities. Good ROM, no contractures, no atrophy. Normal muscle tone.  Skin: no rashes, lesions, ulcers. No induration Neurologic: Sensation intact. Strength 5/5 in all 4.  Psychiatric: Normal judgment and insight. Alert and oriented x 3. Normal mood.   EKG: independently reviewed, showing sinus rhythm with rate of 94, QTc 444  Chest x-ray on Admission: I personally reviewed and I agree with radiologist reading as below.  DG Shoulder Right  Result Date: 11/16/2021 CLINICAL DATA:  Status post fall. EXAM: RIGHT SHOULDER - 2+ VIEW COMPARISON:  None. FINDINGS: There is no evidence  of acute fracture or dislocation. Mild chronic changes seen along the greater tubercle of the right humeral head. Mild degenerative changes are seen involving the right glenohumeral articulation. Soft tissues are unremarkable. IMPRESSION: 1. No acute fracture or dislocation. 2. Mild degenerative changes. Electronically Signed   By: Virgina Norfolk M.D.   On: 11/09/2021 22:31   CT Head Wo Contrast  Result Date: 11/04/2021 CLINICAL DATA:  Status post fall. EXAM: CT HEAD WITHOUT CONTRAST TECHNIQUE: Contiguous axial images were obtained from the base of the skull through the vertex without intravenous contrast. COMPARISON:  None. FINDINGS: Brain: There is mild to moderate severity cerebral atrophy with widening of the extra-axial spaces and ventricular dilatation. There are areas of decreased attenuation within the white matter tracts of the supratentorial brain, consistent with microvascular disease changes. Vascular: No hyperdense vessel or unexpected calcification. Skull: Normal. Negative for fracture or focal lesion. Sinuses/Orbits: No acute finding. Other: None. IMPRESSION: 1. Generalized cerebral atrophy and microvascular disease changes of the supratentorial brain. 2. No acute intracranial abnormality. Electronically Signed   By: Virgina Norfolk M.D.   On: 11/04/2021 22:00   CT Cervical Spine Wo Contrast  Result Date: 11/29/2021 CLINICAL DATA:  Status post fall. EXAM: CT CERVICAL SPINE WITHOUT CONTRAST TECHNIQUE: Multidetector CT imaging of the cervical spine was performed without intravenous contrast. Multiplanar CT image reconstructions were also generated. COMPARISON:  October 03, 2021 FINDINGS: Alignment: There is approximately 2 mm anterolisthesis of the C7 vertebral body on T1. Skull base and vertebrae: No acute fracture. No primary bone lesion or focal pathologic process. Soft tissues and spinal canal: No prevertebral fluid or swelling. No visible canal hematoma. Disc levels: Mild multilevel  endplate sclerosis is seen throughout the cervical spine. This is most prominent at the levels of C3-C4, C4-C5 and C5-C6. There is marked severity narrowing of the anterior atlantoaxial articulation. Marked severity posterior intervertebral disc space narrowing is seen at the levels of C2-C3, C3-C4, C4-C5, C5-C6 and C6-C7. Bilateral moderate to marked severity multilevel facet joint hypertrophy is noted. Upper chest: Negative. Other: None. IMPRESSION: 1. No acute fracture within the cervical spine. 2. Marked severity multilevel degenerative changes, most prominent at the levels of C2-C3, C3-C4, C4-C5, C5-C6 and C6-C7. Electronically Signed   By: Virgina Norfolk M.D.   On: 12/02/2021 22:04   DG Chest Portable 1 View  Result Date: 11/26/2021 CLINICAL DATA:  Preop.  Right hip fracture.  Mechanical fall. EXAM: PORTABLE CHEST 1 VIEW COMPARISON:  Radiograph 10/03/2021 FINDINGS: The cardiomediastinal contours are normal. Atherosclerosis of the aortic arch. Borderline hyperinflation which is similar to prior exam. Pulmonary vasculature is normal. No consolidation, pleural effusion, or pneumothorax. No acute osseous abnormalities are seen. IMPRESSION: No acute chest findings. Electronically Signed   By: Keith Rake M.D.   On: 11/15/2021 23:30   DG Hip Unilat W or Wo Pelvis 2-3 Views Right  Result Date: 11/15/2021 CLINICAL DATA:  Post fall with right hip pain. Tripped while walking with walker. EXAM: DG HIP (WITH OR WITHOUT PELVIS) 2-3V RIGHT COMPARISON:  Pelvis and left hip radiograph 01/01/2019 FINDINGS: Acute intertrochanteric right proximal femur fracture. There is fracture involvement of the greater and lesser trochanters with minimal proximal migration of the femoral shaft. Femoral head remains seated in the acetabulum. Mild chronic regularity of the right pubic body. The bones are diffusely under mineralized. Right-sided double-J stent in place, the proximal pigtail is at the level of L4, the distal  pigtail in the left pelvis. IMPRESSION: 1. Acute intertrochanteric right proximal femur fracture with minimal proximal migration of the femoral shaft. 2. Right double-J stent in place, proximal pigtail at the level of L4, possibly in the ureter, though not well delineated by radiograph. Electronically Signed   By: Keith Rake M.D.   On: 11/19/2021 22:34   DG FEMUR, MIN 2 VIEWS RIGHT  Result Date: 11/15/2021 CLINICAL DATA:  Post fall with right hip pain. Tripped while walking with walker. EXAM: RIGHT FEMUR 2 VIEWS COMPARISON:  None. FINDINGS: Comminuted proximal femur fracture with inter trochanteric involvement, fracture involving both lesser and greater trochanters. Mild  proximal migration of the femoral shaft. There is nondisplaced subtrochanteric involvement through the central aspect of the proximal femoral shaft approaching the medial cortex. The more distal femur is intact. Soft tissues appear a trophic. IMPRESSION: Comminuted displaced intertrochanteric proximal femur fracture. There is nondisplaced subtrochanteric involvement through the central aspect of the proximal femoral shaft. Electronically Signed   By: Keith Rake M.D.   On: 11/21/2021 22:36    Labs on Admission: I have personally reviewed following labs  CBC: Recent Labs  Lab 11/19/2021 2351  WBC 13.2*  NEUTROABS 11.4*  HGB 9.6*  HCT 29.9*  MCV 97.7  PLT 254   Basic Metabolic Panel: Recent Labs  Lab 11/09/2021 2351  NA 138  K 5.2*  CL 107  CO2 22  GLUCOSE 116*  BUN 64*  CREATININE 2.67*  CALCIUM 9.0   GFR: Estimated Creatinine Clearance: 7.1 mL/min (A) (by C-G formula based on SCr of 2.67 mg/dL (H)).  Liver Function Tests: Recent Labs  Lab 11/13/2021 2351  AST 19  ALT 18  ALKPHOS 135*  BILITOT 0.5  PROT 7.1  ALBUMIN 3.7   Coagulation Profile: Recent Labs  Lab 12/03/2021 2351  INR 1.2   Urine analysis:    Component Value Date/Time   COLORURINE YELLOW 10/03/2021 Ensign  10/03/2021 1142   APPEARANCEUR Cloudy (A) 04/10/2021 1005   LABSPEC 1.020 10/03/2021 1142   LABSPEC 1.004 04/13/2012 1932   PHURINE 5.5 10/03/2021 1142   GLUCOSEU NEGATIVE 10/03/2021 1142   GLUCOSEU Negative 04/13/2012 1932   HGBUR SMALL (A) 10/03/2021 1142   BILIRUBINUR NEGATIVE 10/03/2021 1142   BILIRUBINUR Negative 04/10/2021 1005   BILIRUBINUR Negative 04/13/2012 1932   KETONESUR NEGATIVE 10/03/2021 1142   PROTEINUR 100 (A) 10/03/2021 1142   NITRITE NEGATIVE 10/03/2021 1142   LEUKOCYTESUR LARGE (A) 10/03/2021 1142   LEUKOCYTESUR 3+ 04/13/2012 1932   Dr. Tobie Poet [facility]  If 7PM-7AM, please contact overnight-coverage provider If 7AM-7PM, please contact day coverage provider [email or web address]  11/08/2021, 12:46 AM

## 2021-11-06 NOTE — Progress Notes (Signed)
[facility] Progress Note  Patient: April Green    ZOX:[id number]  DOA: 11/08/2021     Date of Service: the patient was seen and examined on 11/17/2021  Chief Complaint  Patient presents with   Fall   Hip Pain   Brief hospital course: ALEENAH HOMEN is a 88 y.o. female with medical history significant for hypertension, depression, anxiety, insomnia, hard of hearing, who presents to the emergency department for chief concerns of right hip pain. ED Course: Discussed with EDP, patient requiring hospitalization for chief concerns of right proximal femur fracture.    Vitals in the ED was stable with BP of 152/69. In the ED, she was given one dose of fentanyl 25 mcg IVP.    Labs in the ED showed K of 5.2, nonfasting glucose of 116, bun 64, sCr of 2.67, gfr 16, wbc of 13.2, hgb 9.6, platelets of 384.    Assessment and Plan: Principal Problem:   Hip fracture ([place]) Active Problems:   UPJ obstruction, congenital   Essential hypertension   Leukocytosis   Protein-calorie malnutrition, severe   CKD (chronic kidney disease) stage 4, GFR 15-29 ml/min (HCC)    Right comminuted displaced intertrochanteric proximal femur fracture - presumed secondary to mechanical fall - pain control Orthopedic consulted, s/p ORIF/intramedullary nail intertrochanteric done on 1/3 Follow orthopedics for DVT prophylaxis and duration PT and OT eval for disposition plan    UTI, UA positive Started ceftriaxone 1 g IV daily Follow urine culture   HTN - resumed amlodipine 10 mg qhs - Hydralazine 5 mg IV q6h prn for sbp > 180   CKD4 - at baseline  Monitor renal functions  Iron deficiency anemia Started oral iron supplement and vitamin C Monitor H&H  congenital right UPJ obstruction and a chronic indwelling metallic stent  Patient follows urology as an outpatient No active issues   Body mass index is 15.12 kg/m.  Nutrition Problem: Increased nutrient needs Etiology: post-op  healing Interventions: Interventions: Ensure Enlive (each supplement provides 350kcal and 20 grams of protein), MVI   Diet: Renal diet DVT Prophylaxis: Subcutaneous Heparin    Advance goals of care discussion: DNR  Family Communication: family was present at bedside, at the time of interview.  The pt provided permission to discuss medical plan with the family. Opportunity was given to ask question and all questions were answered satisfactorily.   Disposition:  Pt is from [employer] [employer], admitted with fall and right hip fracture, s/p ORIF done on 1/3, pending PT and OT eval, which precludes a safe discharge. Discharge to SNF, when PT and OT eval will be done and bed will be available..  Subjective: Patient was seen after the ORIF, she was little bit confused, she was able to tell me her name and date of birth but she said that she is at home right now.  She was complaining of pain and asked for pain medication. Patient denies any chest palpitation, no shortness of breath noticed.  Physical Exam: General:  alert oriented to time and person.  Appear in no distress, affect appropriate Eyes: PERRLA ENT: Oral Mucosa Clear, moist  Neck: nop JVD,  Cardiovascular: S1 and S2 Present, no Murmur,  Respiratory: good respiratory effort, Bilateral Air entry equal and Decreased, no Crackles, no wheezes Abdomen: Bowel Sound present, Soft and no tenderness,  Skin: no rashes Extremities: no Pedal edema, no calf tenderness Neurologic: without any new focal findings Gait not checked due to patient safety concerns  Vitals:   11/24/2021  1315 11/13/2021 1345 11/12/2021 1400 11/25/2021 1538  BP: (!) 125/56 (!) 133/58 134/68 (!) 136/50  Pulse: 74 76 77 91  Resp: 14 14 16 17   Temp:  98 F (36.7 C) 98.1 F (36.7 C) (!) 97.5 F (36.4 C)  TempSrc:   Oral   SpO2: 94% 93% 100% 100%  Weight:      Height:        Intake/Output Summary (Last 24 hours) at 12/04/2021 1613 Last data filed at 11/07/2021 1237 Gross  per 24 hour  Intake 50 ml  Output 1025 ml  Net -975 ml   Filed Weights   11/17/2021 2126 11/09/2021 0954  Weight: 36.3 kg 36.3 kg    Data Reviewed: I have personally reviewed and interpreted daily labs, tele strips, imagings as discussed above. I reviewed all nursing notes, pharmacy notes, vitals, pertinent old records I have discussed plan of care as described above with RN and patient/family.  CBC: Recent Labs  Lab 11/11/2021 2351 11/27/2021 0554  WBC 13.2* 11.4*  NEUTROABS 11.4*  --   HGB 9.6* 8.2*  HCT 29.9* 25.5*  MCV 97.7 97.3  PLT 384 762   Basic Metabolic Panel: Recent Labs  Lab 12/04/2021 2351 11/07/2021 0554  NA 138 140  K 5.2* 5.0  CL 107 111  CO2 22 22  GLUCOSE 116* 121*  BUN 64* 56*  CREATININE 2.67* 2.38*  CALCIUM 9.0 8.8*    Studies: DG Shoulder Right  Result Date: 11/16/2021 CLINICAL DATA:  Status post fall. EXAM: RIGHT SHOULDER - 2+ VIEW COMPARISON:  None. FINDINGS: There is no evidence of acute fracture or dislocation. Mild chronic changes seen along the greater tubercle of the right humeral head. Mild degenerative changes are seen involving the right glenohumeral articulation. Soft tissues are unremarkable. IMPRESSION: 1. No acute fracture or dislocation. 2. Mild degenerative changes. Electronically Signed   By: Virgina Norfolk M.D.   On: 11/16/2021 22:31   CT Head Wo Contrast  Result Date: 11/14/2021 CLINICAL DATA:  Status post fall. EXAM: CT HEAD WITHOUT CONTRAST TECHNIQUE: Contiguous axial images were obtained from the base of the skull through the vertex without intravenous contrast. COMPARISON:  None. FINDINGS: Brain: There is mild to moderate severity cerebral atrophy with widening of the extra-axial spaces and ventricular dilatation. There are areas of decreased attenuation within the white matter tracts of the supratentorial brain, consistent with microvascular disease changes. Vascular: No hyperdense vessel or unexpected calcification. Skull: Normal.  Negative for fracture or focal lesion. Sinuses/Orbits: No acute finding. Other: None. IMPRESSION: 1. Generalized cerebral atrophy and microvascular disease changes of the supratentorial brain. 2. No acute intracranial abnormality. Electronically Signed   By: Virgina Norfolk M.D.   On: 11/30/2021 22:00   CT Cervical Spine Wo Contrast  Result Date: 11/25/2021 CLINICAL DATA:  Status post fall. EXAM: CT CERVICAL SPINE WITHOUT CONTRAST TECHNIQUE: Multidetector CT imaging of the cervical spine was performed without intravenous contrast. Multiplanar CT image reconstructions were also generated. COMPARISON:  October 03, 2021 FINDINGS: Alignment: There is approximately 2 mm anterolisthesis of the C7 vertebral body on T1. Skull base and vertebrae: No acute fracture. No primary bone lesion or focal pathologic process. Soft tissues and spinal canal: No prevertebral fluid or swelling. No visible canal hematoma. Disc levels: Mild multilevel endplate sclerosis is seen throughout the cervical spine. This is most prominent at the levels of C3-C4, C4-C5 and C5-C6. There is marked severity narrowing of the anterior atlantoaxial articulation. Marked severity posterior intervertebral disc space narrowing is seen  at the levels of C2-C3, C3-C4, C4-C5, C5-C6 and C6-C7. Bilateral moderate to marked severity multilevel facet joint hypertrophy is noted. Upper chest: Negative. Other: None. IMPRESSION: 1. No acute fracture within the cervical spine. 2. Marked severity multilevel degenerative changes, most prominent at the levels of C2-C3, C3-C4, C4-C5, C5-C6 and C6-C7. Electronically Signed   By: Virgina Norfolk M.D.   On: 11/09/2021 22:04   DG Chest Portable 1 View  Result Date: 11/29/2021 CLINICAL DATA:  Preop.  Right hip fracture.  Mechanical fall. EXAM: PORTABLE CHEST 1 VIEW COMPARISON:  Radiograph 10/03/2021 FINDINGS: The cardiomediastinal contours are normal. Atherosclerosis of the aortic arch. Borderline hyperinflation which  is similar to prior exam. Pulmonary vasculature is normal. No consolidation, pleural effusion, or pneumothorax. No acute osseous abnormalities are seen. IMPRESSION: No acute chest findings. Electronically Signed   By: Keith Rake M.D.   On: 11/30/2021 23:30   DG C-Arm 1-60 Min-No Report  Result Date: 11/29/2021 Fluoroscopy was utilized by the requesting physician.  No radiographic interpretation.   DG Hip Unilat W or Wo Pelvis 2-3 Views Right  Result Date: 11/29/2021 CLINICAL DATA:  Post fall with right hip pain. Tripped while walking with walker. EXAM: DG HIP (WITH OR WITHOUT PELVIS) 2-3V RIGHT COMPARISON:  Pelvis and left hip radiograph 01/01/2019 FINDINGS: Acute intertrochanteric right proximal femur fracture. There is fracture involvement of the greater and lesser trochanters with minimal proximal migration of the femoral shaft. Femoral head remains seated in the acetabulum. Mild chronic regularity of the right pubic body. The bones are diffusely under mineralized. Right-sided double-J stent in place, the proximal pigtail is at the level of L4, the distal pigtail in the left pelvis. IMPRESSION: 1. Acute intertrochanteric right proximal femur fracture with minimal proximal migration of the femoral shaft. 2. Right double-J stent in place, proximal pigtail at the level of L4, possibly in the ureter, though not well delineated by radiograph. Electronically Signed   By: Keith Rake M.D.   On: 11/29/2021 22:34   DG FEMUR, MIN 2 VIEWS RIGHT  Result Date: 11/20/2021 CLINICAL DATA:  Fluoroscopic assistance for internal fixation of fracture of right femur EXAM: RIGHT FEMUR 2 VIEWS COMPARISON:  11/16/2021 FINDINGS: There is interval internal fixation of comminuted intertrochanteric fracture of right femur with intramedullary rod and a large caliber screw in the neck. IMPRESSION: Status post internal fixation of comminuted intertrochanteric fracture of right femur. Electronically Signed   By: Elmer Picker M.D.   On: 11/22/2021 12:39   DG FEMUR, MIN 2 VIEWS RIGHT  Result Date: 11/19/2021 CLINICAL DATA:  Post fall with right hip pain. Tripped while walking with walker. EXAM: RIGHT FEMUR 2 VIEWS COMPARISON:  None. FINDINGS: Comminuted proximal femur fracture with inter trochanteric involvement, fracture involving both lesser and greater trochanters. Mild proximal migration of the femoral shaft. There is nondisplaced subtrochanteric involvement through the central aspect of the proximal femoral shaft approaching the medial cortex. The more distal femur is intact. Soft tissues appear a trophic. IMPRESSION: Comminuted displaced intertrochanteric proximal femur fracture. There is nondisplaced subtrochanteric involvement through the central aspect of the proximal femoral shaft. Electronically Signed   By: Keith Rake M.D.   On: 11/21/2021 22:36    Scheduled Meds:  amLODipine  10 mg Oral QHS   [START ON 11/07/2021] vitamin C  500 mg Oral Daily   busPIRone  7.5 mg Oral BID   docusate sodium  100 mg Oral BID   [START ON 11/07/2021] feeding supplement  237 mL Oral BID  BM   heparin injection (subcutaneous)  5,000 Units Subcutaneous Q12H   [START ON 11/07/2021] iron polysaccharides  150 mg Oral Daily   latanoprost  1 drop Both Eyes QHS   LORazepam  0.5 mg Oral QHS   melatonin  5 mg Oral QHS   morphine       [START ON 11/07/2021] multivitamin with minerals  1 tablet Oral Daily   Continuous Infusions:  sodium chloride 75 mL/hr at 12/03/2021 1423   [START ON 11/07/2021] cefTRIAXone (ROCEPHIN)  IV     lactated ringers 250 mL/hr at 11/21/2021 1235   PRN Meds: alum & mag hydroxide-simeth, bisacodyl, hydrALAZINE, HYDROcodone-acetaminophen, menthol-cetylpyridinium **OR** phenol, morphine injection, ondansetron **OR** ondansetron (ZOFRAN) IV, torsemide  Time spent: 35 minutes  Author: Val Riles. MD [facility] 11/16/2021 4:13 PM  To reach On-call, see care teams to locate the attending and reach  out to them via [email or web address]. If 7PM-7AM, please contact night-coverage If you still have difficulty reaching the attending provider, please page the [facility] (Director on Call) for [facility] on amion for assistance.

## 2021-11-06 NOTE — Progress Notes (Signed)
Initial Nutrition Assessment  DOCUMENTATION CODES:   Underweight  INTERVENTION:   -Once diet is advanced, add:   -Ensure Enlive po BID, each supplement provides 350 kcal and 20 grams of protein  -MVI with minerals daily  NUTRITION DIAGNOSIS:   Increased nutrient needs related to post-op healing as evidenced by estimated needs.  GOAL:   Patient will meet greater than or equal to 90% of their needs  MONITOR:   PO intake, Supplement acceptance, Diet advancement, Labs, Weight trends, Skin, I & O's  REASON FOR ASSESSMENT:   Consult Assessment of nutrition requirement/status, Hip fracture protocol  ASSESSMENT:   April Green is a 88 y.o. female with medical history significant for hypertension, depression, anxiety, insomnia, hard of hearing, who presents to the emergency department for chief concerns of right hip pain.  Pt admitted with rt hip fx s/p fall.   Reviewed I/O's: -450 ml x 24 hours  UOP: 450 ml x 24 hours   Per orthopedics notes, plan for ORIF. Pt is currently NPO for procedure.   Pt unavailable at time of visit. Pt currently in OR for procedure. RD unable to obtain further nutrition-related history or complete nutrition-focused physical exam at this time.    Reviewed wt hx; wt has been stable over the past 6 months.   Pt with increased nutritional needs for post-operative healing. Pt would benefit from addition of oral nutrition supplements. Pt is at high risk for malnutrition, given underweight status and advanced age, however, unable to identify at this time.   Medications reviewed and include vitamin C, morphine, and 0.9% sodium chloride infusion @ 100 ml/hr.   Labs reviewed.   Diet Order:   Diet Order             Diet NPO time specified  Diet effective now                   EDUCATION NEEDS:   Education needs have been addressed  Skin:  Skin Assessment: Reviewed RN Assessment  Last BM:  11/21/2021  Height:   Ht Readings from Last 1  Encounters:  11/12/2021 5\' 1"  (1.549 m)    Weight:   Wt Readings from Last 1 Encounters:  11/27/2021 36.3 kg    Ideal Body Weight:  47.7 kg  BMI:  Body mass index is 15.12 kg/m.  Estimated Nutritional Needs:   Kcal:  1250-1450  Protein:  55-70 grams  Fluid:  > 1.2 L    Loistine Chance, RD, LDN, [place] Registered Dietitian II Certified Diabetes Care and Education Specialist Please refer to [facility] for RD and/or RD on-call/weekend/after hours pager

## 2021-11-06 NOTE — Anesthesia Preprocedure Evaluation (Addendum)
Anesthesia Evaluation  Patient identified by MRN, date of birth, ID band Patient awake    Reviewed: Allergy & Precautions, NPO status , Patient's Chart, lab work & pertinent test results  History of Anesthesia Complications (+) PONV and history of anesthetic complications  Airway Mallampati: III  TM Distance: >3 FB Neck ROM: full    Dental  (+) Poor Dentition, Chipped   Pulmonary shortness of breath and with exertion,    Pulmonary exam normal        Cardiovascular Exercise Tolerance: Poor METS (Walker use): < 3 Mets hypertension, Normal cardiovascular exam+ dysrhythmias (Left anterior fascicular block on EKG)      Neuro/Psych PSYCHIATRIC DISORDERS Depression anxiety, insomnianegative neurological ROS     GI/Hepatic negative GI ROS, Neg liver ROS,   Endo/Other  negative endocrine ROS  Renal/GU CRFRenal disease (Stage IV)History of kidney stones     Musculoskeletal   Abdominal Normal abdominal exam  (+)   Peds  Hematology  (+) Blood dyscrasia (Anemia of Chronic Disease), anemia ,   Anesthesia Other Findings Right proximal femur fracture s/p mechanical Fall  Weight loss, unintentional, Protein-calorie malnutrition, severe   Past Medical History: No date: Acute cystitis No date: Anxiety No date: Cancer ([place])     Comment:  skin cancer No date: Chronic UTI No date: Depression No date: Dyspnea No date: Dysrhythmia     Comment:  tachy arrythmia No date: Glaucoma No date: History of kidney stones No date: Hydronephrosis     Comment:  stage IV ckd No date: Hypertension No date: Incomplete bladder emptying No date: Macular degeneration No date: PONV (postoperative nausea and vomiting)     Comment:  nausea only No date: Renal cyst No date: SUI (stress urinary incontinence, female) No date: Unsteady gait No date: UPJ obstruction, congenital No date: Vertigo 04/2021: Weight loss, unintentional     Comment:   having difficulty keeping weight on  Past Surgical History: No date: ABDOMINAL HYSTERECTOMY No date: cysto stent exchange     Comment:  once a year for last 6 years. metal stent 02/16/2018: CYSTOSCOPY W/ RETROGRADES; Right     Comment:  Procedure: CYSTOSCOPY WITH RETROGRADE PYELOGRAM;                Surgeon: Hollice Espy, MD;  Location: [facility];                Service: Urology;  Laterality: Right; 04/24/2020: CYSTOSCOPY W/ RETROGRADES; Right     Comment:  Procedure: CYSTOSCOPY WITH RETROGRADE PYELOGRAM;                Surgeon: Hollice Espy, MD;  Location: [facility];                Service: Urology;  Laterality: Right; 04/16/2021: CYSTOSCOPY W/ RETROGRADES; Right     Comment:  Procedure: CYSTOSCOPY WITH RETROGRADE PYELOGRAM;                Surgeon: Hollice Espy, MD;  Location: [facility];                Service: Urology;  Laterality: Right; 01/01/2016: CYSTOSCOPY W/ URETERAL STENT PLACEMENT; Right     Comment:  Procedure: CYSTOSCOPY WITH STENT REPLACEMENT;  Surgeon:               Hollice Espy, MD;  Location: [facility];  Service:               Urology;  Laterality: Right; 02/18/2017: CYSTOSCOPY W/ URETERAL STENT PLACEMENT; Right  Comment:  Procedure: CYSTOSCOPY WITH STENT REPLACEMENT;  Surgeon:               Hollice Espy, MD;  Location: [facility];  Service:               Urology;  Laterality: Right; 02/16/2018: CYSTOSCOPY W/ URETERAL STENT PLACEMENT; Right     Comment:  Procedure: CYSTOSCOPY WITH STENT REPLACEMENT;  Surgeon:               Hollice Espy, MD;  Location: [facility];  Service:               Urology;  Laterality: Right; 06/07/2019: CYSTOSCOPY W/ URETERAL STENT PLACEMENT; Right     Comment:  Procedure: CYSTOSCOPY WITH STENT REPLACEMENT;  Surgeon:               Hollice Espy, MD;  Location: [facility];  Service:               Urology;  Laterality: Right; 04/24/2020: CYSTOSCOPY W/ URETERAL STENT PLACEMENT; Right     Comment:  Procedure: CYSTOSCOPY WITH STENT REPLACEMENT;   Surgeon:               Hollice Espy, MD;  Location: [facility];  Service:               Urology;  Laterality: Right;  metal stent 04/16/2021: CYSTOSCOPY W/ URETERAL STENT PLACEMENT; Right     Comment:  Procedure: CYSTOSCOPY WITH STENT REPLACEMENT;  Surgeon:               Hollice Espy, MD;  Location: [facility];  Service:               Urology;  Laterality: Right; No date: EYE SURGERY; Bilateral     Comment:  cataract extractions 05/2008: HERNIA REPAIR; Left     Comment:  inguinal hernia  BMI    Body Mass Index: 15.12 kg/m      Reproductive/Obstetrics negative OB ROS                            Anesthesia Physical Anesthesia Plan  ASA: 3  Anesthesia Plan: Spinal   Post-op Pain Management:    Induction:   PONV Risk Score and Plan: Treatment may vary due to age or medical condition and TIVA  Airway Management Planned: Natural Airway and Nasal Cannula  Additional Equipment:   Intra-op Plan:   Post-operative Plan:   Informed Consent: I have reviewed the patients History and Physical, chart, labs and discussed the procedure including the risks, benefits and alternatives for the proposed anesthesia with the patient or authorized representative who has indicated his/her understanding and acceptance.    Discussed DNR with patient and Continue DNR.   Dental Advisory Given  Plan Discussed with: Anesthesiologist, CRNA and Surgeon  Anesthesia Plan Comments: (Patient reports no bleeding problems and no anticoagulant use.  Plan for spinal with backup GA  Patient consented for risks of anesthesia including but not limited to:  - adverse reactions to medications - damage to eyes, teeth, lips or other oral mucosa - nerve damage due to positioning  - risk of bleeding, infection and or nerve damage from spinal that could lead to paralysis - risk of headache or failed spinal - damage to teeth, lips or other oral mucosa - sore throat or hoarseness -  damage to heart, brain, nerves, lungs, other parts of body or loss of life  Patient voiced understanding.)       Anesthesia Quick Evaluation

## 2021-11-06 NOTE — Transfer of Care (Signed)
Immediate Anesthesia Transfer of Care Note  Patient: April Green  Procedure(s) Performed: INTRAMEDULLARY (IM) NAIL INTERTROCHANTRIC (Right)  Patient Location: PACU  Anesthesia Type:General and Spinal  Level of Consciousness: drowsy  Airway & Oxygen Therapy: Patient Spontanous Breathing  Post-op Assessment: Report given to RN and Post -op Vital signs reviewed and stable  Post vital signs: Reviewed and stable  Last Vitals:  Vitals Value Taken Time  BP 119/53 12/03/2021 1235  Temp    Pulse 94 11/15/2021 1236  Resp 17 11/29/2021 1236  SpO2 94 % 11/30/2021 1236  Vitals shown include unvalidated device data.  Last Pain:  Vitals:   11/11/2021 0954  TempSrc: Temporal  PainSc: 4          Complications: No notable events documented.

## 2021-11-06 NOTE — Plan of Care (Signed)
  Problem: Education: Goal: Knowledge of General Education information will improve Description: Including pain rating scale, medication(s)/side effects and non-pharmacologic comfort measures Outcome: Progressing   Problem: Health Behavior/Discharge Planning: Goal: Ability to manage health-related needs will improve Outcome: Progressing   Problem: Clinical Measurements: Goal: Will remain free from infection Outcome: Progressing   

## 2021-11-07 ENCOUNTER — Encounter: Payer: Self-pay | Admitting: Orthopedic Surgery

## 2021-11-07 DIAGNOSIS — S72001A Fracture of unspecified part of neck of right femur, initial encounter for closed fracture: Secondary | ICD-10-CM | POA: Diagnosis not present

## 2021-11-07 LAB — URINE CULTURE

## 2021-11-07 LAB — CBC
HCT: 24.3 % — ABNORMAL LOW (ref 36.0–46.0)
Hemoglobin: 7.7 g/dL — ABNORMAL LOW (ref 12.0–15.0)
MCH: 31.8 pg (ref 26.0–34.0)
MCHC: 31.7 g/dL (ref 30.0–36.0)
MCV: 100.4 fL — ABNORMAL HIGH (ref 80.0–100.0)
Platelets: 271 10*3/uL (ref 150–400)
RBC: 2.42 MIL/uL — ABNORMAL LOW (ref 3.87–5.11)
RDW: 13.6 % (ref 11.5–15.5)
WBC: 10.8 10*3/uL — ABNORMAL HIGH (ref 4.0–10.5)
nRBC: 0 % (ref 0.0–0.2)

## 2021-11-07 LAB — BASIC METABOLIC PANEL
Anion gap: 9 (ref 5–15)
BUN: 44 mg/dL — ABNORMAL HIGH (ref 8–23)
CO2: 21 mmol/L — ABNORMAL LOW (ref 22–32)
Calcium: 8.4 mg/dL — ABNORMAL LOW (ref 8.9–10.3)
Chloride: 110 mmol/L (ref 98–111)
Creatinine, Ser: 2.23 mg/dL — ABNORMAL HIGH (ref 0.44–1.00)
GFR, Estimated: 20 mL/min — ABNORMAL LOW (ref >60)
Glucose, Bld: 114 mg/dL — ABNORMAL HIGH (ref 70–99)
Potassium: 5.2 mmol/L — ABNORMAL HIGH (ref 3.5–5.1)
Sodium: 140 mmol/L (ref 135–145)

## 2021-11-07 LAB — URINALYSIS, COMPLETE (UACMP) WITH MICROSCOPIC
Bilirubin Urine: NEGATIVE
Glucose, UA: NEGATIVE mg/dL
Ketones, ur: NEGATIVE mg/dL
Nitrite: NEGATIVE
Protein, ur: 30 mg/dL — AB
Specific Gravity, Urine: 1.01 (ref 1.005–1.030)
WBC, UA: >50 WBC/hpf — ABNORMAL HIGH (ref 0–5)
pH: 5 (ref 5.0–8.0)

## 2021-11-07 LAB — MAGNESIUM: Magnesium: 1.8 mg/dL (ref 1.7–2.4)

## 2021-11-07 LAB — PHOSPHORUS: Phosphorus: 4.1 mg/dL (ref 2.5–4.6)

## 2021-11-07 MED ORDER — POLYETHYLENE GLYCOL 3350 17 G PO PACK
17.0000 g | PACK | Freq: Every day | ORAL | Status: DC
  Administered 2021-11-08: 17 g via ORAL
  Filled 2021-11-07 (×2): qty 1

## 2021-11-07 MED ORDER — BISACODYL 10 MG RE SUPP
10.0000 mg | Freq: Every day | RECTAL | Status: DC | PRN
  Filled 2021-11-07: qty 1

## 2021-11-07 MED ORDER — BISACODYL 5 MG PO TBEC: 10.0000 mg | DELAYED_RELEASE_TABLET | Freq: Every day | ORAL | Status: DC | PRN

## 2021-11-07 MED ORDER — SODIUM ZIRCONIUM CYCLOSILICATE 10 G PO PACK
10.0000 g | PACK | Freq: Once | ORAL | Status: AC
  Administered 2021-11-07: 10 g via ORAL
  Filled 2021-11-07: qty 1

## 2021-11-07 NOTE — NC FL2 (Signed)
[place] LEVEL OF CARE SCREENING TOOL     IDENTIFICATION  Patient Name: April Green Birthdate: [birth year] Sex: female Admission Date (Current Location): 11/06/2021  [facility] and [place] Number:  Engineering geologist and Address:  [facility], [address], [place], [place] [address]      Provider Number: [id number]  Attending Physician Name and Address:  Val Riles, MD  Relative Name and Phone Number:  Kris Hartmann [phone]    Current Level of Care: Hospital Recommended Level of Care: [place] Prior Approval Number:    Date Approved/Denied:   PASRR Number: [id number]  Discharge Plan: SNF    Current Diagnoses: Patient Active Problem List   Diagnosis Date Noted   Hip fracture, right ([place]) 11/07/2021   CKD (chronic kidney disease) stage 4, GFR 15-29 ml/min ([place]) 11/14/2021   Fall    Protein-calorie malnutrition, severe 10/05/2021   Sepsis ([place]) 10/03/2021   Essential hypertension 10/03/2021   AKI (acute kidney injury) ([place]) 10/03/2021   Leukocytosis 10/03/2021   Recurrent UTI 11/07/2015   Atrophic vaginitis 11/07/2015   Urinary tract infectious disease 10/19/2015   UPJ obstruction, congenital 10/19/2015    Orientation RESPIRATION BLADDER Height & Weight     Self, Time, Situation, Place  Normal Continent, External catheter Weight: 36.3 kg Height:  5\' 1"  (154.9 cm)  BEHAVIORAL SYMPTOMS/MOOD NEUROLOGICAL BOWEL NUTRITION STATUS      Continent Diet (regular)  AMBULATORY STATUS COMMUNICATION OF NEEDS Skin   Extensive Assist Verbally Normal, Surgical wounds                       Personal Care Assistance Level of Assistance  Bathing, Feeding, Dressing Bathing Assistance: Limited assistance Feeding assistance: Independent Dressing Assistance: Maximum assistance     Functional Limitations Info  Hearing   Hearing Info: Impaired      SPECIAL CARE FACTORS FREQUENCY  PT (By licensed PT), OT  (By licensed OT)     PT Frequency: 5 times per week OT Frequency: 5 times per week            Contractures Contractures Info: Not present    Additional Factors Info  Code Status, Allergies Code Status Info: DNR Allergies Info: Sulfa Antibiotics, Amoxicillin-pot Clavulanate, Azithromycin, Diphenhydramine, Trazodone And Nefazodone, Lokelma (Sodium Zirconium Cyclosilicate), 5-methoxypsoralen, Aspirin, Ceftriaxone, Flexeril (Cyclobenzaprine), Levofloxacin, Mirtazapine, Oxycodone           Current Medications (11/07/2021):  This is the current hospital active medication list Current Facility-Administered Medications  Medication Dose Route Frequency Provider Last Rate Last Admin   0.9 %  sodium chloride infusion   Intravenous Continuous Hessie Knows, MD   Stopped at 11/07/21 0830   alum & mag hydroxide-simeth (MAALOX/MYLANTA) 200-200-20 MG/5ML suspension 30 mL  30 mL Oral Q4H PRN Hessie Knows, MD       amLODipine (NORVASC) tablet 10 mg  10 mg Oral QHS Hessie Knows, MD   10 mg at 11/29/2021 2130   ascorbic acid (VITAMIN C) tablet 500 mg  500 mg Oral Daily Hessie Knows, MD   500 mg at 11/07/21 1028   bisacodyl (DULCOLAX) EC tablet 10 mg  10 mg Oral Daily PRN Val Riles, MD       bisacodyl (DULCOLAX) suppository 10 mg  10 mg Rectal Daily PRN Val Riles, MD       busPIRone (BUSPAR) tablet 7.5 mg  7.5 mg Oral BID Hessie Knows, MD   7.5 mg at 11/07/21 1029  cefTRIAXone (ROCEPHIN) 1 g in sodium chloride 0.9 % 100 mL IVPB  1 g Intravenous Q24H Hessie Knows, MD 200 mL/hr at 11/07/21 1040 1 g at 11/07/21 1040   Chlorhexidine Gluconate Cloth 2 % PADS 6 each  6 each Topical Daily Val Riles, MD   6 each at 11/26/2021 1822   docusate sodium (COLACE) capsule 100 mg  100 mg Oral BID Hessie Knows, MD   100 mg at 11/07/21 1029   feeding supplement (ENSURE ENLIVE / ENSURE PLUS) liquid 237 mL  237 mL Oral BID BM Hessie Knows, MD   237 mL at 11/07/21 1030   heparin injection 5,000 Units  5,000  Units Subcutaneous Q12H Hessie Knows, MD   5,000 Units at 11/07/21 1030   hydrALAZINE (APRESOLINE) injection 5 mg  5 mg Intravenous Q6H PRN Hessie Knows, MD       HYDROcodone-acetaminophen (NORCO/VICODIN) 5-325 MG per tablet 1-2 tablet  1-2 tablet Oral Q6H PRN Hessie Knows, MD   1 tablet at 11/07/21 1027   iron polysaccharides (NIFEREX) capsule 150 mg  150 mg Oral Daily Hessie Knows, MD   150 mg at 11/07/21 1030   lactated ringers infusion   Intravenous Continuous Hessie Knows, MD 250 mL/hr at 12/02/2021 1235 Rate Change at 11/27/2021 1235   latanoprost (XALATAN) 0.005 % ophthalmic solution 1 drop  1 drop Both Eyes QHS Hessie Knows, MD   1 drop at 11/21/2021 2130   LORazepam (ATIVAN) tablet 0.5 mg  0.5 mg Oral QHS Hessie Knows, MD   0.5 mg at 11/29/2021 2130   melatonin tablet 5 mg  5 mg Oral QHS Hessie Knows, MD   5 mg at 11/17/2021 2130   menthol-cetylpyridinium (CEPACOL) lozenge 3 mg  1 lozenge Oral PRN Hessie Knows, MD       Or   phenol (CHLORASEPTIC) mouth spray 1 spray  1 spray Mouth/Throat PRN Hessie Knows, MD       morphine 2 MG/ML injection 0.5 mg  0.5 mg Intravenous Q2H PRN Hessie Knows, MD   0.5 mg at 11/23/2021 1026   multivitamin with minerals tablet 1 tablet  1 tablet Oral Daily Hessie Knows, MD   1 tablet at 11/07/21 1028   ondansetron (ZOFRAN) tablet 4 mg  4 mg Oral Q6H PRN Hessie Knows, MD       Or   ondansetron [facility]) injection 4 mg  4 mg Intravenous Q6H PRN Hessie Knows, MD       [START ON 11/08/2021] polyethylene glycol (MIRALAX / GLYCOLAX) packet 17 g  17 g Oral Daily Val Riles, MD       torsemide [facility]) tablet 10 mg  10 mg Oral Q48H PRN Hessie Knows, MD         Discharge Medications: Please see discharge summary for a list of discharge medications.  Relevant Imaging Results:  Relevant Lab Results:   Additional Information SS# [id number]  Conception Oms, RN

## 2021-11-07 NOTE — Progress Notes (Signed)
° °  Subjective: 1 Day Post-Op Procedure(s) (LRB): INTRAMEDULLARY (IM) NAIL INTERTROCHANTRIC (Right) Patient reports pain as mild.   Patient is well, and has had no acute complaints or problems Denies any CP, SOB, ABD pain. We will continue therapy today.    Objective: Vital signs in last 24 hours: Temp:  [97.4 F (36.3 C)-99.6 F (37.6 C)] 99.5 F (37.5 C) (01/04 0814) Pulse Rate:  [74-103] 103 (01/04 0814) Resp:  [10-17] 16 (01/04 0814) BP: (119-143)/(50-68) 135/59 (01/04 0814) SpO2:  [88 %-100 %] 88 % (01/04 0814) Weight:  [36.3 kg] 36.3 kg (01/03 0954)  Intake/Output from previous day: 01/03 0701 - 01/04 0700 In: 245.3 [I.V.:195.3; IV Piggyback:50] Out: 1245 [Urine:1150; Blood:25] Intake/Output this shift: No intake/output data recorded.  Recent Labs    11/29/2021 2351 12/02/2021 0554 11/07/21 0546  HGB 9.6* 8.2* 7.7*   Recent Labs    11/12/2021 0554 11/07/21 0546  WBC 11.4* 10.8*  RBC 2.62* 2.42*  HCT 25.5* 24.3*  PLT 318 271   Recent Labs    11/30/2021 0554 11/07/21 0546  NA 140 140  K 5.0 5.2*  CL 111 110  CO2 22 21*  BUN 56* 44*  CREATININE 2.38* 2.23*  GLUCOSE 121* 114*  CALCIUM 8.8* 8.4*   Recent Labs    12/03/2021 2351  INR 1.2    EXAM General - Patient is Alert and Confused Extremity - Neurovascular intact Sensation intact distally Intact pulses distally Dorsiflexion/Plantar flexion intact Dressing - dressing C/D/I and no drainage Motor Function - intact, moving foot and toes well on exam.   Past Medical History:  Diagnosis Date   Acute cystitis    Anxiety    Cancer ([place])    skin cancer   Chronic UTI    Depression    Dyspnea    Dysrhythmia    tachy arrythmia   Glaucoma    History of kidney stones    Hydronephrosis    stage IV ckd   Hypertension    Incomplete bladder emptying    Macular degeneration    PONV (postoperative nausea and vomiting)    nausea only   Renal cyst    SUI (stress urinary incontinence, female)     Unsteady gait    UPJ obstruction, congenital    Vertigo    Weight loss, unintentional 04/2021   having difficulty keeping weight on    Assessment/Plan:   1 Day Post-Op Procedure(s) (LRB): INTRAMEDULLARY (IM) NAIL INTERTROCHANTRIC (Right) Principal Problem:   Hip fracture (HCC) Active Problems:   UPJ obstruction, congenital   Essential hypertension   Leukocytosis   Protein-calorie malnutrition, severe   CKD (chronic kidney disease) stage 4, GFR 15-29 ml/min (HCC)  Estimated body mass index is 15.12 kg/m as calculated from the following:   Height as of this encounter: 5\' 1"  (1.549 m).   Weight as of this encounter: 36.3 kg. Advance diet Up with therapy Acute post op blood loss anemia - Hgb 7.7. Recheck labs in the am Pain controlled VSS, continue to monitor closely. Encourage incentive spirometer Work on [employer] CM to assist with discharge  DVT Prophylaxis - TED hose and SCDs, Heparin Weight-Bearing as tolerated to right leg   T. Rachelle Hora, PA-C [place] 11/07/2021, 8:24 AM

## 2021-11-07 NOTE — Progress Notes (Signed)
[facility] Progress Note  Patient: April Green    PNT:[id number]  DOA: 11/22/2021     Date of Service: the patient was seen and examined on 11/07/2021  Chief Complaint  Patient presents with   Fall   Hip Pain   Brief hospital course: April Green is a 88 y.o. female with medical history significant for hypertension, depression, anxiety, insomnia, hard of hearing, who presents to the emergency department for chief concerns of right hip pain. ED Course: Discussed with EDP, patient requiring hospitalization for chief concerns of right proximal femur fracture.    Vitals in the ED was stable with BP of 152/69. In the ED, she was given one dose of fentanyl 25 mcg IVP.    Labs in the ED showed K of 5.2, nonfasting glucose of 116, bun 64, sCr of 2.67, gfr 16, wbc of 13.2, hgb 9.6, platelets of 384.    Assessment and Plan: Principal Problem:   Hip fracture ([place]) Active Problems:   UPJ obstruction, congenital   Essential hypertension   Leukocytosis   Protein-calorie malnutrition, severe   CKD (chronic kidney disease) stage 4, GFR 15-29 ml/min (HCC)    Right comminuted displaced intertrochanteric proximal femur fracture - presumed secondary to mechanical fall - pain control Orthopedic consulted, s/p ORIF/intramedullary nail intertrochanteric done on 1/3 Follow orthopedics for DVT prophylaxis and duration PT and OT eval for disposition plan    UTI, UA positive, urine culture grew multiple species, recommended recollection. Continue ceftriaxone 1 g IV daily Repeat UA and urine culture   HTN - resumed amlodipine 10 mg qhs - Hydralazine 5 mg IV q6h prn for sbp > 180   CKD4 - at baseline  Monitor renal functions  Iron deficiency anemia Started oral iron supplement and vitamin C Monitor H&H  congenital right UPJ obstruction and a chronic indwelling metallic stent  Patient follows urology as an outpatient No active issues  Hyperkalemia, Lokelma 10g x1 dose  given Continue low potassium diet and monitor potassium level daily       Body mass index is 15.12 kg/m.  Nutrition Problem: Increased nutrient needs Etiology: post-op healing Interventions: Interventions: Ensure Enlive (each supplement provides 350kcal and 20 grams of protein), MVI   Diet: Renal diet DVT Prophylaxis: Subcutaneous Heparin    Advance goals of care discussion: DNR  Family Communication: family was present at bedside, at the time of interview.  The pt provided permission to discuss medical plan with the family. Opportunity was given to ask question and all questions were answered satisfactorily.   Disposition:  Pt is from [place], admitted with fall and right hip fracture, s/p ORIF done on 1/3, pending PT and OT eval, which precludes a safe discharge. Discharge to SNF, when PT and OT eval will be done and bed will be available..  Subjective: No significant overnight events, patient said that it hurts pointing to right hip while movement, no pain at rest.  Patient denied any chest pain or palpitation, no shortness of breath.  Patient denied any other active issues.  Physical Exam: General:  alert oriented to time and person.  Appear in no distress, affect appropriate Eyes: PERRLA ENT: Oral Mucosa Clear, moist  Neck: nop JVD,  Cardiovascular: S1 and S2 Present, no Murmur,  Respiratory: good respiratory effort, Bilateral Air entry equal and Decreased, no Crackles, no wheezes Abdomen: Bowel Sound present, Soft and no tenderness,  Skin: no rashes Extremities: no Pedal edema, no calf tenderness Neurologic: without any new focal  findings Gait not checked due to patient safety concerns  Vitals:   11/07/21 0323 11/07/21 0814 11/07/21 0833 11/07/21 1215  BP: 140/60 (!) 135/59  127/66  Pulse: 96 (!) 103 100 91  Resp: 17 16  17   Temp: 99.6 F (37.6 C) 99.5 F (37.5 C)  97.7 F (36.5 C)  TempSrc:      SpO2: 90% (!) 88% 94% 98%  Weight:      Height:         Intake/Output Summary (Last 24 hours) at 11/07/2021 1359 Last data filed at 11/07/2021 0900 Gross per 24 hour  Intake 315.34 ml  Output 600 ml  Net -284.66 ml   Filed Weights   11/08/2021 2126 11/07/2021 0954  Weight: 36.3 kg 36.3 kg    Data Reviewed: I have personally reviewed and interpreted daily labs, tele strips, imagings as discussed above. I reviewed all nursing notes, pharmacy notes, vitals, pertinent old records I have discussed plan of care as described above with RN and patient/family.  CBC: Recent Labs  Lab 12/02/2021 2351 12/03/2021 0554 11/07/21 0546  WBC 13.2* 11.4* 10.8*  NEUTROABS 11.4*  --   --   HGB 9.6* 8.2* 7.7*  HCT 29.9* 25.5* 24.3*  MCV 97.7 97.3 100.4*  PLT 384 318 786   Basic Metabolic Panel: Recent Labs  Lab 12/01/2021 2351 11/14/2021 0554 11/07/21 0546  NA 138 140 140  K 5.2* 5.0 5.2*  CL 107 111 110  CO2 22 22 21*  GLUCOSE 116* 121* 114*  BUN 64* 56* 44*  CREATININE 2.67* 2.38* 2.23*  CALCIUM 9.0 8.8* 8.4*  MG  --   --  1.8  PHOS  --   --  4.1    Studies: No results found.  Scheduled Meds:  amLODipine  10 mg Oral QHS   vitamin C  500 mg Oral Daily   busPIRone  7.5 mg Oral BID   Chlorhexidine Gluconate Cloth  6 each Topical Daily   docusate sodium  100 mg Oral BID   feeding supplement  237 mL Oral BID BM   heparin injection (subcutaneous)  5,000 Units Subcutaneous Q12H   iron polysaccharides  150 mg Oral Daily   latanoprost  1 drop Both Eyes QHS   LORazepam  0.5 mg Oral QHS   melatonin  5 mg Oral QHS   multivitamin with minerals  1 tablet Oral Daily   Continuous Infusions:  sodium chloride Stopped (11/07/21 0830)   cefTRIAXone (ROCEPHIN)  IV 1 g (11/07/21 1040)   lactated ringers 250 mL/hr at 11/26/2021 1235   PRN Meds: alum & mag hydroxide-simeth, bisacodyl, hydrALAZINE, HYDROcodone-acetaminophen, menthol-cetylpyridinium **OR** phenol, morphine injection, ondansetron **OR** ondansetron (ZOFRAN) IV, torsemide  Time spent: 35  minutes  Author: Val Riles. MD [facility] 11/07/2021 1:59 PM  To reach On-call, see care teams to locate the attending and reach out to them via [email or web address]. If 7PM-7AM, please contact night-coverage If you still have difficulty reaching the attending provider, please page the [facility] (Director on Call) for [facility] on amion for assistance.

## 2021-11-07 NOTE — Evaluation (Signed)
Physical Therapy Evaluation Patient Details Name: April Green MRN: [id number] DOB: [birth year] Today's Date: 11/07/2021  History of Present Illness  presented to ER secondary to R hip pain after mechanical fall; admitted for management of R displaced, comminuted hip fracture s/p IM nailing (11/25/2021), WBAT.  Clinical Impression  Patient resting in bed, niece at bedside upon arrival to room; alert and oriented to basic information, but easily confused with details, more complex information.  Patient with indicators of moderate pain to R hip, appears to be intermittent spasms (FACES 8/10); meds administered per RN at beginning of session.  Generally weak throughout R LE, guarded with movement in all planes; does demonstrate R LE strength at least 3-/5 throughout.  Currently requiring mod/max assist for bed mobility; min assist for unsupported sitting.  Persistent L lateral lean, lists posteriorally, due to R hip pain in WBing.  Unable to tolerate additional OOB/standing attempts, requesting return to supine at this time.  Will continue to assess/progress as appropriate in subsequent sessions. Of note, sats >95% on 2L throughout session.  Will continue to monitor and trial weaning next session as appropriate. Would benefit from skilled PT to address above deficits and promote optimal return to PLOF.;recommend transition to STR upon discharge from acute hospitalization.        Recommendations for follow up therapy are one component of a multi-disciplinary discharge planning process, led by the attending physician.  Recommendations may be updated based on patient status, additional functional criteria and insurance authorization.  Follow Up Recommendations Skilled nursing-short term rehab (<3 hours/day)    Assistance Recommended at Discharge Frequent or constant Supervision/Assistance  Patient can return home with the following  Two people to help with walking and/or transfers;Two people to help with  bathing/dressing/bathroom    Equipment Recommendations Rolling walker (2 wheels);BSC/3in1  Recommendations for Other Services       Functional Status Assessment Patient has had a recent decline in their functional status and demonstrates the ability to make significant improvements in function in a reasonable and predictable amount of time.     Precautions / Restrictions Precautions Precautions: Fall Restrictions Weight Bearing Restrictions: Yes RLE Weight Bearing: Weight bearing as tolerated      Mobility  Bed Mobility Overal bed mobility: Needs Assistance Bed Mobility: Supine to Sit;Sit to Supine     Supine to sit: Mod assist;Max assist Sit to supine: Mod assist;Max assist   General bed mobility comments: extensive assist for LE management and truncal elevation    Transfers                   General transfer comment: unable to tolerate due to pain    Ambulation/Gait               General Gait Details: unable to tolerate due to pain  Stairs            Wheelchair Mobility    Modified Rankin (Stroke Patients Only)       Balance Overall balance assessment: Needs assistance Sitting-balance support: No upper extremity supported;Feet supported Sitting balance-Leahy Scale: Fair Sitting balance - Comments: L lean to offset pain in R hip Postural control: Posterior lean;Left lateral lean                                   Pertinent Vitals/Pain Pain Assessment: Faces Faces Pain Scale: Hurts whole lot Pain Location: R hip Pain Descriptors / Indicators:  Grimacing;Moaning;Spasm Pain Intervention(s): Limited activity within patient's tolerance;Monitored during session;Repositioned    Home Living Family/patient expects to be discharged to:: Skilled nursing facility                        Prior Function Prior Level of Function : Needs assist  Cognitive Assist : Mobility (cognitive)     Physical Assist : ADLs  (physical);Mobility (physical)     Mobility Comments: Ambulatory with [facility] at baseline; ambulates to/from dining hall for meals, supervision from staff each time.  Family does endorse multiple fall history (typically falls backwards), with at least 3 in previous month. ADLs Comments: family drives and runs errands, she has a PCA that helps with cleaning on Mondays     Hand Dominance        Extremity/Trunk Assessment   Upper Extremity Assessment Upper Extremity Assessment: Overall WFL for tasks assessed    Lower Extremity Assessment Lower Extremity Assessment:  (grossly 3-/5 throughout R hip, tolerating approx 50% ROM, limited by pain; otherwise, LEs grossly 4-/5)       Communication   Communication: HOH  Cognition Arousal/Alertness: Awake/alert Behavior During Therapy: WFL for tasks assessed/performed Overall Cognitive Status: Within Functional Limits for tasks assessed                                          General Comments      Exercises Other Exercises Other Exercises: Supine LE therex, 1x10, act assist ROM: ankle pumps, quad sets, SAQs, heel slides, hip abduct.  Assist from therapist, all planes; generally guarded due to pain Other Exercises: tolerated unsupported sitting x4 minutes, min assist-working to optimize midline orientation in M/L, A/P planes.  Limited balance reactions; hesitant for R hip WBing   Assessment/Plan    PT Assessment Patient needs continued PT services  PT Problem List Decreased strength;Decreased range of motion;Decreased activity tolerance;Decreased balance;Decreased mobility;Decreased knowledge of use of DME;Decreased knowledge of precautions;Pain;Decreased coordination;Decreased cognition;Cardiopulmonary status limiting activity       PT Treatment Interventions DME instruction;Gait training;Stair training;Functional mobility training;Therapeutic activities;Therapeutic exercise;Balance training;Patient/family education     PT Goals (Current goals can be found in the Care Plan section)  Acute Rehab PT Goals Patient Stated Goal: to do the best I can PT Goal Formulation: With patient/family Time For Goal Achievement: 11/21/21 Potential to Achieve Goals: Fair    Frequency 7X/week     Co-evaluation               AM-PAC PT "6 Clicks" Mobility  Outcome Measure Help needed turning from your back to your side while in a flat bed without using bedrails?: A Lot Help needed moving from lying on your back to sitting on the side of a flat bed without using bedrails?: A Lot Help needed moving to and from a bed to a chair (including a wheelchair)?: Total Help needed standing up from a chair using your arms (e.g., wheelchair or bedside chair)?: Total Help needed to walk in hospital room?: Total Help needed climbing 3-5 steps with a railing? : Total 6 Click Score: 8    End of Session   Activity Tolerance: Patient limited by pain Patient left: in bed;with call bell/phone within reach;with bed alarm set;with family/visitor present Nurse Communication: Mobility status PT Visit Diagnosis: Muscle weakness (generalized) (M62.81);Difficulty in walking, not elsewhere classified (R26.2);Pain Pain - Right/Left: Right Pain - part  of body: Hip    Time: 7199-4129 PT Time Calculation (min) (ACUTE ONLY): 29 min   Charges:   PT Evaluation $PT Eval Moderate Complexity: 1 Mod PT Treatments $Therapeutic Exercise: 8-22 mins        Mishael Haran H. Owens Shark, PT, DPT, NCS 11/07/21, 3:13 PM [phone]

## 2021-11-07 NOTE — TOC Progression Note (Signed)
Transition of Care [facility]) - Progression Note    Patient Details  Name: April Green MRN: [id number] Date of Birth: May 26, 1923  [facility] CM/SW [place], RN Phone Number: 11/07/2021, 10:49 AM  Clinical Narrative:   TOC continues to follow and assist with DC planning, awaiting PT eval and recommendation         Expected Discharge Plan and Services                                                 Social Determinants of Health (SDOH) Interventions    Readmission Risk Interventions No flowsheet data found.

## 2021-11-07 NOTE — Evaluation (Signed)
Occupational Therapy Evaluation Patient Details Name: April Green MRN: [id number] DOB: 05-12-1923 Today's Date: 11/07/2021   History of Present Illness 88 yo F who presented to ER secondary to R hip pain after mechanical fall; admitted for management of R displaced, comminuted hip fracture s/p IM nailing (11/09/2021), WBAT.   Clinical Impression   Pt seen for OT evaluation this date. Prior to admission, pt was independent with grooming, dressing, and toileting, living at an ALF, and receiving SUPERVISION for bathing and for functional mobility with rollator to dining hall. Pt currently requires MAX A for bed mobility, SUPERVISION/SET-UP for seated UB dressing, MAX A for LB dressing, and MOD-MAX A for BSC transfers via step pivot transfers with RW due to current functional impairments (See OT Problem List below). During mobility, SpO2 >92% while on 2L of O2 and HR 100s-110s. At end of session, pt reporting 10/10 hip pain and requesting pain medication; RN informed. Pt would benefit from additional skilled OT services to maximize return to PLOF and minimize risk of future falls, injury, caregiver burden, and readmission. Upon discharge, recommend SNF.         Recommendations for follow up therapy are one component of a multi-disciplinary discharge planning process, led by the attending physician.  Recommendations may be updated based on patient status, additional functional criteria and insurance authorization.   Follow Up Recommendations  Skilled nursing-short term rehab (<3 hours/day)    Assistance Recommended at Discharge Frequent or constant Supervision/Assistance  Patient can return home with the following A lot of help with walking and/or transfers;A lot of help with bathing/dressing/bathroom    Functional Status Assessment  Patient has had a recent decline in their functional status and demonstrates the ability to make significant improvements in function in a reasonable and predictable  amount of time.  Equipment Recommendations  Other (comment) (defer to next venue of care)       Precautions / Restrictions Precautions Precautions: Fall Restrictions Weight Bearing Restrictions: Yes RLE Weight Bearing: Weight bearing as tolerated      Mobility Bed Mobility Overal bed mobility: Needs Assistance Bed Mobility: Supine to Sit;Sit to Supine     Supine to sit: Mod assist Sit to supine: Max assist   General bed mobility comments: extensive assist for LE management and truncal elevation    Transfers Overall transfer level: Needs assistance Equipment used: Rolling walker (2 wheels) Transfers: Sit to/from Stand;Bed to chair/wheelchair/BSC Sit to Stand: Mod assist     Step pivot transfers: Mod assist;Max assist     General transfer comment: MOD A for initial stand pivot transfer to [facility]. MAX A for transfer BSC>bed      Balance Overall balance assessment: Needs assistance Sitting-balance support: No upper extremity supported;Feet supported Sitting balance-Leahy Scale: Fair Sitting balance - Comments: SUPERVISION while sitting unsupported at EOB and on BSC Postural control: Posterior lean;Left lateral lean Standing balance support: Bilateral upper extremity supported;During functional activity Standing balance-Leahy Scale: Poor Standing balance comment: Requires MOD-MAX A for stand pivot transfers                           ADL either performed or assessed with clinical judgement   ADL Overall ADL's : Needs assistance/impaired     Grooming: Wash/dry hands;Supervision/safety;Set up;Sitting Grooming Details (indicate cue type and reason): SUPERVISION for washing hands with washcloth while seated on BSC         Upper Body Dressing : Supervision/safety;Set up;Sitting Upper Body Dressing Details (  indicate cue type and reason): SUPERVISION for donning/doffing hospital gown while seated on BSC Lower Body Dressing: Maximal assistance;Sitting/lateral  leans Lower Body Dressing Details (indicate cue type and reason): to don socks Toilet Transfer: Moderate assistance;Maximal assistance;BSC/3in1;Stand-pivot Toilet Transfer Details (indicate cue type and reason): MOD A for initial stand pivot transfer to [facility]. MAX A for transfer BSC>bed Toileting- Clothing Manipulation and Hygiene: Moderate assistance;Sit to/from stand Toileting - Clothing Manipulation Details (indicate cue type and reason): MOD A for steadying while pt performed frontal peri-care             Vision Baseline Vision/History: 1 Wears glasses Ability to See in Adequate Light: 0 Adequate Patient Visual Report: No change from baseline              Pertinent Vitals/Pain Pain Assessment: 0-10 Pain Score: 10-Worst pain ever Faces Pain Scale: Hurts whole lot Pain Location: R hip Pain Descriptors / Indicators: Grimacing;Moaning;Guarding Pain Intervention(s): Limited activity within patient's tolerance;Monitored during session;Patient requesting pain meds-RN notified        Extremity/Trunk Assessment Upper Extremity Assessment Upper Extremity Assessment: Overall WFL for tasks assessed   Lower Extremity Assessment Lower Extremity Assessment: Generalized weakness;Defer to PT evaluation       Communication Communication Communication: HOH   Cognition Arousal/Alertness: Awake/alert Behavior During Therapy: WFL for tasks assessed/performed Overall Cognitive Status: Within Functional Limits for tasks assessed                                          Exercises Other Exercises Other Exercises: Supine LE therex, 1x10, act assist ROM: ankle pumps, quad sets, SAQs, heel slides, hip abduct.  Assist from therapist, all planes; generally guarded due to pain Other Exercises: tolerated unsupported sitting x4 minutes, min assist-working to optimize midline orientation in M/L, A/P planes.  Limited balance reactions; hesitant for R hip [place] expects to be discharged to:: Skilled nursing facility                                 Additional Comments: At baseline, pt lives at an ALF      Prior Functioning/Environment Prior Level of Function : Needs assist  Cognitive Assist : Mobility (cognitive)     Physical Assist : ADLs (physical);Mobility (physical)   ADLs (physical): IADLs;Bathing Mobility Comments: Ambulatory with 4WRW at baseline; ambulates to/from dining hall for meals, supervision from staff each time.  Family does endorse multiple fall history (typically falls backwards), with at least 3 in previous month. ADLs Comments: Independent with dressing, grooming, & toileting. Requires supervision for bathing. family drives and runs errands, she has a PCA that helps with cleaning on Mondays        OT Problem List: Decreased activity tolerance;Impaired balance (sitting and/or standing);Pain      OT Treatment/Interventions: Self-care/ADL training;Therapeutic exercise;DME and/or AE instruction;Therapeutic activities;Patient/family education;Balance training    OT Goals(Current goals can be found in the care plan section) Acute Rehab OT Goals Patient Stated Goal: to have less pain OT Goal Formulation: With patient Time For Goal Achievement: 11/21/21 Potential to Achieve Goals: Good ADL Goals Pt Will Perform Lower Body Dressing: with min assist;sitting/lateral leans (with LRAD) Pt Will Transfer to Toilet: with min assist;stand pivot transfer;bedside commode Pt Will Perform Toileting - Clothing Manipulation and hygiene: with  min assist;sit to/from stand  OT Frequency: Min 2X/week       AM-PAC OT "6 Clicks" Daily Activity     Outcome Measure Help from another person eating meals?: None Help from another person taking care of personal grooming?: A Little Help from another person toileting, which includes using toliet, bedpan, or urinal?: A Lot Help from another person bathing (including  washing, rinsing, drying)?: A Lot Help from another person to put on and taking off regular upper body clothing?: A Little Help from another person to put on and taking off regular lower body clothing?: A Lot 6 Click Score: 16   End of Session Equipment Utilized During Treatment: Gait belt;Rolling walker (2 wheels);Oxygen Nurse Communication: Mobility status;Patient requests pain meds  Activity Tolerance: Patient tolerated treatment well Patient left: in bed;with call bell/phone within reach;with bed alarm set;with family/visitor present  OT Visit Diagnosis: Unsteadiness on feet (R26.81);History of falling (Z91.81);Muscle weakness (generalized) (M62.81);Pain Pain - Right/Left: Right Pain - part of body: Leg                Time: 1337-1410 OT Time Calculation (min): 33 min Charges:  OT General Charges $OT Visit: 1 Visit OT Evaluation $OT Eval Moderate Complexity: 1 Mod OT Treatments $Self Care/Home Management : 23-37 mins  Fredirick Maudlin, OTR/L [place]

## 2021-11-08 ENCOUNTER — Inpatient Hospital Stay: Payer: Medicare Other

## 2021-11-08 DIAGNOSIS — S72001A Fracture of unspecified part of neck of right femur, initial encounter for closed fracture: Secondary | ICD-10-CM | POA: Diagnosis not present

## 2021-11-08 LAB — CBC
HCT: 22.8 % — ABNORMAL LOW (ref 36.0–46.0)
Hemoglobin: 7.2 g/dL — ABNORMAL LOW (ref 12.0–15.0)
MCH: 30.8 pg (ref 26.0–34.0)
MCHC: 31.6 g/dL (ref 30.0–36.0)
MCV: 97.4 fL (ref 80.0–100.0)
Platelets: 274 10*3/uL (ref 150–400)
RBC: 2.34 MIL/uL — ABNORMAL LOW (ref 3.87–5.11)
RDW: 13.8 % (ref 11.5–15.5)
WBC: 13 10*3/uL — ABNORMAL HIGH (ref 4.0–10.5)
nRBC: 0 % (ref 0.0–0.2)

## 2021-11-08 LAB — BASIC METABOLIC PANEL
Anion gap: 11 (ref 5–15)
BUN: 47 mg/dL — ABNORMAL HIGH (ref 8–23)
CO2: 20 mmol/L — ABNORMAL LOW (ref 22–32)
Calcium: 8.6 mg/dL — ABNORMAL LOW (ref 8.9–10.3)
Chloride: 108 mmol/L (ref 98–111)
Creatinine, Ser: 2.33 mg/dL — ABNORMAL HIGH (ref 0.44–1.00)
GFR, Estimated: 19 mL/min — ABNORMAL LOW (ref >60)
Glucose, Bld: 126 mg/dL — ABNORMAL HIGH (ref 70–99)
Potassium: 4.4 mmol/L (ref 3.5–5.1)
Sodium: 139 mmol/L (ref 135–145)

## 2021-11-08 LAB — MAGNESIUM: Magnesium: 1.9 mg/dL (ref 1.7–2.4)

## 2021-11-08 LAB — ABO/RH: ABO/RH(D): B NEG

## 2021-11-08 LAB — PREPARE RBC (CROSSMATCH)

## 2021-11-08 LAB — PHOSPHORUS: Phosphorus: 4.2 mg/dL (ref 2.5–4.6)

## 2021-11-08 MED ORDER — SODIUM CHLORIDE 0.9% IV SOLUTION: Freq: Once | INTRAVENOUS | Status: AC

## 2021-11-08 MED ORDER — ACETAMINOPHEN 325 MG PO TABS
650.0000 mg | ORAL_TABLET | Freq: Four times a day (QID) | ORAL | Status: DC | PRN
  Administered 2021-11-08: 650 mg via ORAL
  Filled 2021-11-08: qty 2

## 2021-11-08 MED ORDER — GUAIFENESIN-DM 100-10 MG/5ML PO SYRP
5.0000 mL | ORAL_SOLUTION | Freq: Four times a day (QID) | ORAL | Status: DC | PRN
  Administered 2021-11-08: 5 mL via ORAL
  Filled 2021-11-08: qty 5

## 2021-11-08 NOTE — Progress Notes (Signed)
Physical Therapy Treatment Patient Details Name: April Green MRN: [id number] DOB: 17-Feb-1923 Today's Date: 11/08/2021   History of Present Illness 88 yo F who presented to ER secondary to R hip pain after mechanical fall; admitted for management of R displaced, comminuted hip fracture s/p IM nailing (12/03/2021), WBAT.    PT Comments    Pt was long sitting in bed upon arriving. She is A and O x 3 but very HOH. Required increased time to perform all desired task due to pain. She did request to attempt to use BSC to urinate. Pt was able to exit bed, stand, and pivot to [facility] with increased time and constant vcs for improved technique and sequencing. Pain severely limits session progression throughout. She yells out in pain with all wt bearing activity. She did successfully urinate with RN made aware. Pt was issued HEP handout and able to perform with niece's assistance.  SNF still most appropriate at DC to address deficits while maximizing independence with ADLs.    Recommendations for follow up therapy are one component of a multi-disciplinary discharge planning process, led by the attending physician.  Recommendations may be updated based on patient status, additional functional criteria and insurance authorization.  Follow Up Recommendations  Skilled nursing-short term rehab (<86 hours/day)     Assistance Recommended at Discharge Frequent or constant Supervision/Assistance  Patient can return home with the following Two people to help with walking and/or transfers;Two people to help with bathing/dressing/bathroom   Equipment Recommendations  Rolling walker (2 wheels);BSC/3in1       Precautions / Restrictions Precautions Precautions: Fall Restrictions Weight Bearing Restrictions: Yes RLE Weight Bearing: Weight bearing as tolerated     Mobility  Bed Mobility Overal bed mobility: Needs Assistance Bed Mobility: Supine to Sit;Sit to Supine     Supine to sit: Mod assist Sit to supine:  Max assist   General bed mobility comments: pt is severely limited by pain    Transfers Overall transfer level: Needs assistance Equipment used: Rolling walker (2 wheels) Transfers: Sit to/from Stand Sit to Stand: Mod assist     Step pivot transfers: Mod assist     General transfer comment: pt was able to stand and take a few very poor shuffling steps to [facility]. Severely limited by pain. pt yells out throughout all wt bearing activity    Ambulation/Gait    General Gait Details: pt was able to take a few antalgic steps to [facility] then back to bed however not able to ambulate away from EOB due to pain and limited wt acceptance onto RLE     Balance Overall balance assessment: Needs assistance Sitting-balance support: No upper extremity supported;Feet supported Sitting balance-Leahy Scale: Fair Sitting balance - Comments: L lateral lean due to R hip pain   Standing balance support: Bilateral upper extremity supported;During functional activity Standing balance-Leahy Scale: Poor    Cognition Arousal/Alertness: Awake/alert Behavior During Therapy: WFL for tasks assessed/performed Overall Cognitive Status: Within Functional Limits for tasks assessed        General Comments: Pt is A and O x 3. able to consistently follow commands. Is extremely HOH               Pertinent Vitals/Pain Pain Assessment: 0-10 Pain Score: 6  Faces Pain Scale: Hurts little more Pain Location: R hip Pain Descriptors / Indicators: Grimacing;Moaning;Guarding Pain Intervention(s): Limited activity within patient's tolerance;Monitored during session;Repositioned;Premedicated before session;Ice applied     PT Goals (current goals can now be found in the care  plan section) Acute Rehab PT Goals Patient Stated Goal: get better and return to home place (assisted living) Progress towards PT goals: Progressing toward goals    Frequency    7X/week      PT Plan Current plan remains appropriate        AM-PAC PT "6 Clicks" Mobility   Outcome Measure  Help needed turning from your back to your side while in a flat bed without using bedrails?: A Lot Help needed moving from lying on your back to sitting on the side of a flat bed without using bedrails?: A Lot Help needed moving to and from a bed to a chair (including a wheelchair)?: A Lot Help needed standing up from a chair using your arms (e.g., wheelchair or bedside chair)?: A Lot Help needed to walk in hospital room?: A Lot Help needed climbing 3-5 steps with a railing? : Total 6 Click Score: 11    End of Session Equipment Utilized During Treatment: Gait belt Activity Tolerance: Patient limited by pain Patient left: in bed;with call bell/phone within reach;with bed alarm set;with family/visitor present Nurse Communication: Mobility status PT Visit Diagnosis: Muscle weakness (generalized) (M62.81);Difficulty in walking, not elsewhere classified (R26.2);Pain Pain - Right/Left: Right Pain - part of body: Hip     Time: 4695-0722 PT Time Calculation (min) (ACUTE ONLY): 20 min  Charges:  $Therapeutic Activity: 8-22 mins                     Julaine Fusi PTA 11/08/21, 1:41 PM

## 2021-11-08 NOTE — Progress Notes (Signed)
[facility] Progress Note  Patient: April Green    XBM:[id number]  DOA: 12/02/2021     Date of Service: the patient was seen and examined on 11/08/2021  Chief Complaint  Patient presents with   Fall   Hip Pain   Brief hospital course: ANALI CABANILLA is a 88 y.o. female with medical history significant for hypertension, depression, anxiety, insomnia, hard of hearing, who presents to the emergency department for chief concerns of right hip pain. ED Course: Discussed with EDP, patient requiring hospitalization for chief concerns of right proximal femur fracture.    Vitals in the ED was stable with BP of 152/69. In the ED, she was given one dose of fentanyl 25 mcg IVP.    Labs in the ED showed K of 5.2, nonfasting glucose of 116, bun 64, sCr of 2.67, gfr 16, wbc of 13.2, hgb 9.6, platelets of 384.    Assessment and Plan: Principal Problem:   Hip fracture ([place]) Active Problems:   UPJ obstruction, congenital   Essential hypertension   Leukocytosis   Protein-calorie malnutrition, severe   CKD (chronic kidney disease) stage 4, GFR 15-29 ml/min (HCC)    Right comminuted displaced intertrochanteric proximal femur fracture - presumed secondary to mechanical fall - pain control Orthopedic consulted, s/p ORIF/intramedullary nail intertrochanteric done on 1/3 Follow orthopedics for DVT prophylaxis and duration PT and OT eval for disposition plan    UTI, UA positive, urine culture grew multiple species, recommended recollection. Continue ceftriaxone 1 g IV daily Repeat UA and urine culture   HTN - resumed amlodipine 10 mg qhs - Hydralazine 5 mg IV q6h prn for sbp > 180   CKD4 - at baseline  Monitor renal functions  Iron deficiency anemia, on 1/5 Hb 7.2 and the patient was tired and could not work with physical therapy, symptomatic anemia. 1/5 transfused 1 unit of PRBC, monitor H&H Started oral iron supplement and vitamin C Monitor H&H  congenital right UPJ obstruction and a  chronic indwelling metallic stent  Patient follows urology as an outpatient No active issues  Hyperkalemia, Lokelma 10g x1 dose given Continue low potassium diet and monitor potassium level daily   Chest congestion as per patient's daughter, x-ray chest ordered, Started Robitussin-DM as needed for cough Use incentive spirometry and flutter valve if needed    Body mass index is 15.12 kg/m.  Nutrition Problem: Increased nutrient needs Etiology: post-op healing Interventions: Interventions: Ensure Enlive (each supplement provides 350kcal and 20 grams of protein), MVI   Diet: Renal diet DVT Prophylaxis: Subcutaneous Heparin    Advance goals of care discussion: DNR  Family Communication: family was present at bedside, at the time of interview.  The pt provided permission to discuss medical plan with the family. Opportunity was given to ask question and all questions were answered satisfactorily.   Disposition:  Pt is from [place], admitted with fall and right hip fracture, s/p ORIF done on 1/3, PT and OT eval done, recommend SNF, Hb low, patient will receive 1 unit of PRBC transfusion today, which precludes a safe discharge. Discharge to SNF, bed will be available.  Can be discharged tomorrow   Subjective: No significant overnight events, pain is under control.  Patient's daughter mentioned that she was having some visual hallucination and confusion could be secondary to opiates which has been discontinued and started only Tylenol.  Patient was also having some cough and chest congestion, chest x-ray ordered and started on Robitussin-DM as needed.  Patient can  keep herself hydrated, no fluid restriction.   Physical Exam: General:  alert oriented to time and person.  Appear in no distress, affect appropriate Eyes: PERRLA ENT: Oral Mucosa Clear, moist  Neck: nop JVD,  Cardiovascular: S1 and S2 Present, no Murmur,  Respiratory: good respiratory effort, Bilateral Air  entry equal and Decreased, no Crackles, no wheezes Abdomen: Bowel Sound present, Soft and no tenderness,  Skin: no rashes Extremities: no Pedal edema, no calf tenderness Neurologic: without any new focal findings Gait not checked due to patient safety concerns  Vitals:   11/07/21 2327 11/08/21 0325 11/08/21 0745 11/08/21 1124  BP: (!) 121/59 (!) 147/56 (!) 138/52 (!) 113/48  Pulse: 92 96 98 100  Resp: 14 14 15 15   Temp: 98.2 F (36.8 C) 98.3 F (36.8 C) 98.2 F (36.8 C) 98.7 F (37.1 C)  TempSrc:      SpO2: 91%  97% 93%  Weight:      Height:        Intake/Output Summary (Last 24 hours) at 11/08/2021 1450 Last data filed at 11/08/2021 1050 Gross per 24 hour  Intake 240 ml  Output 450 ml  Net -210 ml   Filed Weights   11/13/2021 2126 11/09/2021 0954  Weight: 36.3 kg 36.3 kg    Data Reviewed: I have personally reviewed and interpreted daily labs, tele strips, imagings as discussed above. I reviewed all nursing notes, pharmacy notes, vitals, pertinent old records I have discussed plan of care as described above with RN and patient/family.  CBC: Recent Labs  Lab 11/21/2021 2351 11/12/2021 0554 11/07/21 0546 11/08/21 0522  WBC 13.2* 11.4* 10.8* 13.0*  NEUTROABS 11.4*  --   --   --   HGB 9.6* 8.2* 7.7* 7.2*  HCT 29.9* 25.5* 24.3* 22.8*  MCV 97.7 97.3 100.4* 97.4  PLT 384 318 271 789   Basic Metabolic Panel: Recent Labs  Lab 11/26/2021 2351 11/27/2021 0554 11/07/21 0546 11/08/21 0522  NA 138 140 140 139  K 5.2* 5.0 5.2* 4.4  CL 107 111 110 108  CO2 22 22 21* 20*  GLUCOSE 116* 121* 114* 126*  BUN 64* 56* 44* 47*  CREATININE 2.67* 2.38* 2.23* 2.33*  CALCIUM 9.0 8.8* 8.4* 8.6*  MG  --   --  1.8 1.9  PHOS  --   --  4.1 4.2    Studies: No results found.  Scheduled Meds:  sodium chloride   Intravenous Once   amLODipine  10 mg Oral QHS   vitamin C  500 mg Oral Daily   busPIRone  7.5 mg Oral BID   docusate sodium  100 mg Oral BID   feeding supplement  237 mL Oral BID  BM   heparin injection (subcutaneous)  5,000 Units Subcutaneous Q12H   iron polysaccharides  150 mg Oral Daily   latanoprost  1 drop Both Eyes QHS   LORazepam  0.5 mg Oral QHS   melatonin  5 mg Oral QHS   multivitamin with minerals  1 tablet Oral Daily   polyethylene glycol  17 g Oral Daily   Continuous Infusions:  sodium chloride Stopped (11/07/21 0830)   cefTRIAXone (ROCEPHIN)  IV 1 g (11/08/21 1008)   lactated ringers 250 mL/hr at 11/23/2021 1235   PRN Meds: acetaminophen, alum & mag hydroxide-simeth, bisacodyl, bisacodyl, guaiFENesin-dextromethorphan, hydrALAZINE, menthol-cetylpyridinium **OR** phenol, morphine injection, ondansetron **OR** ondansetron (ZOFRAN) IV, torsemide  Time spent: 35 minutes  Author: Val Riles. MD [facility] 11/08/2021 2:50 PM  To reach On-call, see  care teams to locate the attending and reach out to them via [email or web address]. If 7PM-7AM, please contact night-coverage If you still have difficulty reaching the attending provider, please page the [facility] (Director on Call) for [facility] on amion for assistance.

## 2021-11-08 NOTE — Progress Notes (Signed)
Subjective: 2 Days Post-Op Procedure(s) (LRB): INTRAMEDULLARY (IM) NAIL INTERTROCHANTRIC (Right) Patient reports pain as mild.  Niece is at bedside, state overall she is doing well but have been a little bit of cognitive issues with narcotics. Patient is well, and has had no acute complaints or problems Denies any CP, SOB, ABD pain. We will continue therapy today.    Objective: Vital signs in last 24 hours: Temp:  [97.7 F (36.5 C)-98.7 F (37.1 C)] 98.7 F (37.1 C) (01/05 1124) Pulse Rate:  [86-100] 100 (01/05 1124) Resp:  [14-17] 15 (01/05 1124) BP: (113-147)/(44-66) 113/48 (01/05 1124) SpO2:  [91 %-98 %] 93 % (01/05 1124)  Intake/Output from previous day: 01/04 0701 - 01/05 0700 In: 120 [P.O.:120] Out: -  Intake/Output this shift: Total I/O In: 240 [P.O.:240] Out: -   Recent Labs    11/23/2021 2351 11/22/2021 0554 11/07/21 0546 11/08/21 0522  HGB 9.6* 8.2* 7.7* 7.2*   Recent Labs    11/07/21 0546 11/08/21 0522  WBC 10.8* 13.0*  RBC 2.42* 2.34*  HCT 24.3* 22.8*  PLT 271 274   Recent Labs    11/07/21 0546 11/08/21 0522  NA 140 139  K 5.2* 4.4  CL 110 108  CO2 21* 20*  BUN 44* 47*  CREATININE 2.23* 2.33*  GLUCOSE 114* 126*  CALCIUM 8.4* 8.6*   Recent Labs    11/17/2021 2351  INR 1.2    EXAM General - Patient is Alert, Appropriate, and Oriented Extremity - Neurovascular intact Sensation intact distally Intact pulses distally Dorsiflexion/Plantar flexion intact Dressing - dressing C/D/I and no drainage Motor Function - intact, moving foot and toes well on exam.   Past Medical History:  Diagnosis Date   Acute cystitis    Anxiety    Cancer ([place])    skin cancer   Chronic UTI    Depression    Dyspnea    Dysrhythmia    tachy arrythmia   Glaucoma    History of kidney stones    Hydronephrosis    stage IV ckd   Hypertension    Incomplete bladder emptying    Macular degeneration    PONV (postoperative nausea and vomiting)    nausea only    Renal cyst    SUI (stress urinary incontinence, female)    Unsteady gait    UPJ obstruction, congenital    Vertigo    Weight loss, unintentional 04/2021   having difficulty keeping weight on    Assessment/Plan:   2 Days Post-Op Procedure(s) (LRB): INTRAMEDULLARY (IM) NAIL INTERTROCHANTRIC (Right) Principal Problem:   Hip fracture, right (HCC) Active Problems:   UPJ obstruction, congenital   Essential hypertension   Leukocytosis   Protein-calorie malnutrition, severe   CKD (chronic kidney disease) stage 4, GFR 15-29 ml/min (HCC)  Estimated body mass index is 15.12 kg/m as calculated from the following:   Height as of this encounter: 5\' 1"  (1.549 m).   Weight as of this encounter: 36.3 kg. Advance diet Up with therapy Acute post op blood loss anemia - Hgb 7.2.  Internal medicine discussing transfusion with family, reasonable to go ahead and proceed with 1 unit of packed red blood cells along with iron supplements.  Pain controlled, mild cognitive issues we will try to cut back narcotics and use Tylenol for pain.  Pain currently well controlled  VSS, continue to monitor closely. Encourage incentive spirometer  Work on [employer]  CM to assist with discharge to skilled nursing facility  DVT Prophylaxis - TED hose  and SCDs, Heparin Weight-Bearing as tolerated to right leg   T. Rachelle Hora, PA-C [place] 11/08/2021, 12:07 PM

## 2021-11-08 NOTE — TOC Progression Note (Signed)
Transition of Care [facility]) - Progression Note    Patient Details  Name: April Green MRN: [id number] Date of Birth: [birth year]  Transition of Care [facility]) CM/SW [place], RN Phone Number: 11/08/2021, 1:34 PM  Clinical Narrative:    Reviewed the bed offers with the patient and her daughter at the bedside, they stated that they want to accept the bed offer from [facility], I accepted in the hub and notified Magda Paganini at [employer].         Expected Discharge Plan and Services                                                 Social Determinants of Health (SDOH) Interventions    Readmission Risk Interventions No flowsheet data found.

## 2021-11-09 ENCOUNTER — Encounter: Payer: Self-pay | Admitting: Internal Medicine

## 2021-11-09 ENCOUNTER — Inpatient Hospital Stay: Payer: Medicare Other

## 2021-11-09 DIAGNOSIS — R0603 Acute respiratory distress: Secondary | ICD-10-CM

## 2021-11-09 DIAGNOSIS — S72001A Fracture of unspecified part of neck of right femur, initial encounter for closed fracture: Secondary | ICD-10-CM | POA: Diagnosis not present

## 2021-11-09 LAB — TYPE AND SCREEN
ABO/RH(D): B NEG
Antibody Screen: NEGATIVE
Unit division: 0

## 2021-11-09 LAB — BASIC METABOLIC PANEL
Anion gap: 12 (ref 5–15)
BUN: 45 mg/dL — ABNORMAL HIGH (ref 8–23)
CO2: 18 mmol/L — ABNORMAL LOW (ref 22–32)
Calcium: 8.9 mg/dL (ref 8.9–10.3)
Chloride: 107 mmol/L (ref 98–111)
Creatinine, Ser: 2.22 mg/dL — ABNORMAL HIGH (ref 0.44–1.00)
GFR, Estimated: 20 mL/min — ABNORMAL LOW (ref >60)
Glucose, Bld: 136 mg/dL — ABNORMAL HIGH (ref 70–99)
Potassium: 4.5 mmol/L (ref 3.5–5.1)
Sodium: 137 mmol/L (ref 135–145)

## 2021-11-09 LAB — BPAM RBC
Blood Product Expiration Date: 202301182359
ISSUE DATE / TIME: 202301051640
Unit Type and Rh: 1700

## 2021-11-09 LAB — URINALYSIS, COMPLETE (UACMP) WITH MICROSCOPIC
Bilirubin Urine: NEGATIVE
Glucose, UA: NEGATIVE mg/dL
Ketones, ur: NEGATIVE mg/dL
Nitrite: NEGATIVE
Protein, ur: 100 mg/dL — AB
Specific Gravity, Urine: 1.011 (ref 1.005–1.030)
WBC, UA: >50 WBC/hpf — ABNORMAL HIGH (ref 0–5)
pH: 5 (ref 5.0–8.0)

## 2021-11-09 LAB — CBC
HCT: 27.9 % — ABNORMAL LOW (ref 36.0–46.0)
Hemoglobin: 9.4 g/dL — ABNORMAL LOW (ref 12.0–15.0)
MCH: 31.2 pg (ref 26.0–34.0)
MCHC: 33.7 g/dL (ref 30.0–36.0)
MCV: 92.7 fL (ref 80.0–100.0)
Platelets: 320 10*3/uL (ref 150–400)
RBC: 3.01 MIL/uL — ABNORMAL LOW (ref 3.87–5.11)
RDW: 15.3 % (ref 11.5–15.5)
WBC: 14.1 10*3/uL — ABNORMAL HIGH (ref 4.0–10.5)
nRBC: 0 % (ref 0.0–0.2)

## 2021-11-09 LAB — D-DIMER, QUANTITATIVE: D-Dimer, Quant: 3.33 ug/mL-FEU — ABNORMAL HIGH (ref 0.00–0.50)

## 2021-11-09 LAB — URINE CULTURE: Culture: NO GROWTH

## 2021-11-09 LAB — MAGNESIUM: Magnesium: 1.8 mg/dL (ref 1.7–2.4)

## 2021-11-09 LAB — PROCALCITONIN: Procalcitonin: 3.28 ng/mL

## 2021-11-09 LAB — GLUCOSE, CAPILLARY: Glucose-Capillary: 190 mg/dL — ABNORMAL HIGH (ref 70–99)

## 2021-11-09 LAB — PHOSPHORUS: Phosphorus: 4.3 mg/dL (ref 2.5–4.6)

## 2021-11-09 MED ORDER — HALOPERIDOL LACTATE 5 MG/ML IJ SOLN: 1.0000 mg | Freq: Four times a day (QID) | INTRAMUSCULAR | Status: DC | PRN

## 2021-11-09 MED ORDER — FUROSEMIDE 10 MG/ML IJ SOLN
60.0000 mg | Freq: Once | INTRAMUSCULAR | Status: AC
  Administered 2021-11-09: 60 mg via INTRAVENOUS
  Filled 2021-11-09: qty 6

## 2021-11-09 MED ORDER — SODIUM BICARBONATE 650 MG PO TABS
650.0000 mg | ORAL_TABLET | Freq: Three times a day (TID) | ORAL | Status: DC
  Filled 2021-11-09 (×3): qty 1

## 2021-11-09 MED ORDER — QUETIAPINE FUMARATE 25 MG PO TABS
12.5000 mg | ORAL_TABLET | Freq: Two times a day (BID) | ORAL | Status: DC
  Filled 2021-11-09: qty 1

## 2021-11-09 MED ORDER — HYOSCYAMINE SULFATE 0.125 MG SL SUBL
0.1250 mg | SUBLINGUAL_TABLET | SUBLINGUAL | Status: AC
  Administered 2021-11-09: 0.125 mg via SUBLINGUAL
  Filled 2021-11-09 (×2): qty 1

## 2021-11-09 MED ORDER — HALOPERIDOL 2 MG PO TABS
2.0000 mg | ORAL_TABLET | ORAL | Status: DC | PRN
  Filled 2021-11-09: qty 1

## 2021-11-09 MED ORDER — HALOPERIDOL LACTATE 5 MG/ML IJ SOLN
1.0000 mg | Freq: Once | INTRAMUSCULAR | Status: AC
  Administered 2021-11-09: 1 mg via INTRAVENOUS
  Filled 2021-11-09: qty 1

## 2021-11-09 MED ORDER — CHLORHEXIDINE GLUCONATE CLOTH 2 % EX PADS: 6.0000 | MEDICATED_PAD | Freq: Every day | CUTANEOUS | Status: DC

## 2021-11-09 MED ORDER — LORAZEPAM 2 MG/ML IJ SOLN: 1.0000 mg | INTRAMUSCULAR | Status: DC | PRN

## 2021-11-09 MED ORDER — HALOPERIDOL LACTATE 2 MG/ML PO CONC
2.0000 mg | ORAL | Status: DC | PRN
  Filled 2021-11-09: qty 1

## 2021-11-09 MED ORDER — HALOPERIDOL 1 MG PO TABS
1.0000 mg | ORAL_TABLET | ORAL | Status: DC | PRN
  Filled 2021-11-09: qty 1

## 2021-11-09 MED ORDER — MORPHINE SULFATE (PF) 2 MG/ML IV SOLN
2.0000 mg | INTRAVENOUS | Status: DC | PRN
  Administered 2021-11-09 – 2021-11-10 (×3): 2 mg via INTRAVENOUS
  Filled 2021-11-09 (×3): qty 1

## 2021-11-09 MED ORDER — HALOPERIDOL LACTATE 5 MG/ML IJ SOLN: 1.0000 mg | INTRAMUSCULAR | Status: DC | PRN

## 2021-11-09 MED ORDER — HALOPERIDOL LACTATE 5 MG/ML IJ SOLN: 2.0000 mg | INTRAMUSCULAR | Status: DC | PRN

## 2021-11-09 MED ORDER — HALOPERIDOL LACTATE 2 MG/ML PO CONC
1.0000 mg | ORAL | Status: DC | PRN
  Filled 2021-11-09: qty 0.5

## 2021-11-09 NOTE — Progress Notes (Signed)
° ° ° °  Subjective: Rapid response called for concern with aspiration, Gurgling with breathing, decreased sats, increased rate and work of breathing.  No immediate aspiration event though according to sitter at bedside. No coughing wit h when eating. Baseline patient normally able to communicate verbally and appropriately.  Has had some confusion, including hallucinations last night, however some cleared today according to nurse and family.  Objective: Vitals:   11/09/21 1130 11/09/21 1621  BP: (!) 166/69 (!) 158/71  Pulse: (!) 103 76  Resp: 16 18  Temp: 98.1 F (36.7 C) 98 F (36.7 C)  SpO2: 92% 99%     Assessment:  General - distressed , pale  Neuro - non verbal but able to follow some simple comands. Upward preferential gaze to right eye. Left eye appears to have had lens implant. Abnormal breathing pattern, very poor to no gag , cough reflex. 'Resp - Unable to maintain sats wtihout NRB and increased work of breathing with blowing respirations Chest x-ray IMPRESSION: Mild diffuse interstitial prominence and nodularity may represent edema. Atypical pneumonia is not excluded.   Plan: BIPAP - NT suctioning. Procalcitonin, lasix  Head CT D dimer Family informed of plan and goals of care discussion initiated. Discussed possibility of comfort care with rapid response intervention on 1C After patient transferred to ICU and family seen patient with BIPAP in place, they opted for comfort care. Orders initiated.   Critical care time 60 minutes Critical care time necessary for acute respiratory failure with hypoxia and mental status changes requiring repeat assessments, ordering and interpretation of imaging, labs, and care discussions with family memebers

## 2021-11-09 NOTE — TOC Progression Note (Signed)
Transition of Care [facility]) - Progression Note    Patient Details  Name: April Green MRN: [id number] Date of Birth: 1923/02/02  Transition of Care [facility]) CM/SW [place], RN Phone Number: 11/09/2021, 8:46 AM  Clinical Narrative:   [facility] has accepted her and can take her today         Expected Discharge Plan and Services                                                 Social Determinants of Health (SDOH) Interventions    Readmission Risk Interventions No flowsheet data found.

## 2021-11-09 NOTE — Progress Notes (Signed)
ABG drawn on NRB mask at 100%  PH  7.22 PCO2  45 PaO2  169 HCO3  18.4 BE  -8.9  Unable to transfer data into Epic at this time

## 2021-11-09 NOTE — Progress Notes (Signed)
Per nighttime nursing staff, patient had significant mental status change overnight. Patient is now disoriented x3 and agitated in bed. PT Merleen Nicely saw patient yesterday and agrees patient is altered from when he saw her yesterday (11/08/21). Dr. Rudene Christians aware. Niece at bedside states patient is typically a "little bit forgetful" but overall can carry on a conversation at baseline.

## 2021-11-09 NOTE — Progress Notes (Signed)
°   11/09/21 1930  Clinical Encounter Type  Visited With Health care provider  Visit Type Code   Chaplain Burris responded to code. Pt not available while receiving care interventions. Daryel November offered support to [employer]. Chaplain provided non-anxious presence and silent prayer.

## 2021-11-09 NOTE — Plan of Care (Signed)

## 2021-11-09 NOTE — Progress Notes (Signed)
° °  Subjective: 3 Days Post-Op Procedure(s) (LRB): INTRAMEDULLARY (IM) NAIL INTERTROCHANTRIC (Right) Patient reports pain as mild.  Niece is at bedside.  Significant confusion/delirium. Pain is well controlled.  Afebrile. Denies any CP, SOB, ABD pain. We will continue therapy today.    Objective: Vital signs in last 24 hours: Temp:  [97.4 F (36.3 C)-99 F (37.2 C)] 97.4 F (36.3 C) (01/05 2321) Pulse Rate:  [94-100] 94 (01/05 2321) Resp:  [15-16] 15 (01/05 2321) BP: (113-141)/(48-85) 140/85 (01/05 2321) SpO2:  [92 %-95 %] 94 % (01/05 2321)  Intake/Output from previous day: 01/05 0701 - 01/06 0700 In: 1914 [P.O.:240; I.V.:1159.6; Blood:360; IV Piggyback:154.4] Out: 450 [Urine:450] Intake/Output this shift: No intake/output data recorded.  Recent Labs    11/07/21 0546 11/08/21 0522 11/09/21 0603  HGB 7.7* 7.2* 9.4*   Recent Labs    11/08/21 0522 11/09/21 0603  WBC 13.0* 14.1*  RBC 2.34* 3.01*  HCT 22.8* 27.9*  PLT 274 320   Recent Labs    11/08/21 0522 11/09/21 0603  NA 139 137  K 4.4 4.5  CL 108 107  CO2 20* 18*  BUN 47* 45*  CREATININE 2.33* 2.22*  GLUCOSE 126* 136*  CALCIUM 8.6* 8.9   No results for input(s): LABPT, INR in the last 72 hours.   EXAM General - Patient is Alert and Confused Extremity - Neurovascular intact Sensation intact distally Intact pulses distally Dorsiflexion/Plantar flexion intact Dressing - dressing C/D/I and no drainage Motor Function - intact, moving foot and toes well on exam.   Past Medical History:  Diagnosis Date   Acute cystitis    Anxiety    Cancer ([place])    skin cancer   Chronic UTI    Depression    Dyspnea    Dysrhythmia    tachy arrythmia   Glaucoma    History of kidney stones    Hydronephrosis    stage IV ckd   Hypertension    Incomplete bladder emptying    Macular degeneration    PONV (postoperative nausea and vomiting)    nausea only   Renal cyst    SUI (stress urinary incontinence, female)     Unsteady gait    UPJ obstruction, congenital    Vertigo    Weight loss, unintentional 04/2021   having difficulty keeping weight on    Assessment/Plan:   3 Days Post-Op Procedure(s) (LRB): INTRAMEDULLARY (IM) NAIL INTERTROCHANTRIC (Right) Principal Problem:   Hip fracture, right (HCC) Active Problems:   UPJ obstruction, congenital   Essential hypertension   Leukocytosis   Protein-calorie malnutrition, severe   CKD (chronic kidney disease) stage 4, GFR 15-29 ml/min (HCC)  Estimated body mass index is 15.12 kg/m as calculated from the following:   Height as of this encounter: 5\' 1"  (1.549 m).   Weight as of this encounter: 36.3 kg. Advance diet Up with therapy Acute post op blood loss anemia -hemoglobin 9.4 status post 1 unit packed red blood cells 11/08/2021.    Delirium -patient has not received any narcotics.  Vital signs are stable.  Will order chest x-ray and urinalysis.  Niece states patient has been sleeping well at night.  VSS, continue to monitor closely. Encourage incentive spirometer  Work on [employer]  CM to assist with discharge to skilled nursing facility  DVT Prophylaxis - TED hose and SCDs, Heparin Weight-Bearing as tolerated to right leg   T. Rachelle Hora, PA-C [place] 11/09/2021, 8:10 AM

## 2021-11-09 NOTE — TOC Progression Note (Signed)
Transition of Care [facility]) - Progression Note    Patient Details  Name: April Green MRN: [id number] Date of Birth: October 31, 1923  Transition of Care [facility]) CM/SW [place], RN Phone Number: 11/09/2021, 3:45 PM  Clinical Narrative:    Received a call from [facility], they will be providing care here at the hospital as well as at [facility] after DC , Faxed to [facility] the H and P and Therapy notes. [facility] will accept the patient this weekend as long as they have the DC summary early, Call Seth Bake at [employer]        Expected Discharge Plan and Services                                                 Social Determinants of Health (SDOH) Interventions    Readmission Risk Interventions No flowsheet data found.

## 2021-11-09 NOTE — Progress Notes (Addendum)
[facility] Progress Note  Patient: April Green    [name]  DOA: 11/24/2021     Date of Service: the patient was seen and examined on 11/09/2021  Chief Complaint  Patient presents with   Fall   Hip Pain   Brief hospital course: April Green is a 88 y.o. female with medical history significant for hypertension, depression, anxiety, insomnia, hard of hearing, who presents to the emergency department for chief concerns of right hip pain. ED Course: Discussed with EDP, patient requiring hospitalization for chief concerns of right proximal femur fracture.    Vitals in the ED was stable with BP of 152/69. In the ED, she was given one dose of fentanyl 25 mcg IVP.    Labs in the ED showed K of 5.2, nonfasting glucose of 116, bun 64, sCr of 2.67, gfr 16, wbc of 13.2, hgb 9.6, platelets of 384.    Assessment and Plan: Principal Problem:   Hip fracture ([place]) Active Problems:   UPJ obstruction, congenital   Essential hypertension   Leukocytosis   Protein-calorie malnutrition, severe   CKD (chronic kidney disease) stage 4, GFR 15-29 ml/min (HCC)    Acute delirium secondary to advanced age and dementia and dementia Started Seroquel 12 point milligram p.o. twice daily Use Haldol IV as needed Continue melatonin 5 mg nightly for sleep which may help with delirium  Right comminuted displaced intertrochanteric proximal femur fracture - presumed secondary to mechanical fall - pain control Orthopedic consulted, s/p ORIF/intramedullary nail intertrochanteric done on 1/3 Follow orthopedics for DVT prophylaxis and duration PT and OT eval for disposition plan    UTI, UA positive, urine culture grew multiple species, recommended recollection. S/p ceftriaxone 1 g IV daily, repeat urine culture negative.  Discontinued antibiotics.   HTN - resumed amlodipine 10 mg qhs - Hydralazine 5 mg IV q6h prn for sbp > 180   CKD4 - at baseline  Monitor renal functions  Iron deficiency  anemia, on 1/5 Hb 7.2 and the patient was tired and could not work with physical therapy, symptomatic anemia. 1/5 transfused 1 unit of PRBC, Hb 9.4 posttransfusion, WBC 14 K elevated could be secondary to blood transfusion, we will continue to monitor CBC daily monitor H&H Started oral iron supplement and vitamin C Monitor H&H  congenital right UPJ obstruction and a chronic indwelling metallic stent  Patient follows urology as an outpatient No active issues  Hyperkalemia, Lokelma 10g x1 dose given Continue low potassium diet and monitor potassium level daily   Chest congestion as per patient's daughter, x-ray chest ordered, Started Robitussin-DM as needed for cough Use incentive spirometry and flutter valve if needed    Body mass index is 15.12 kg/m.  Nutrition Problem: Increased nutrient needs Etiology: post-op healing Interventions: Interventions: Ensure Enlive (each supplement provides 350kcal and 20 grams of protein), MVI   Diet: Renal diet DVT Prophylaxis: Subcutaneous Heparin    Advance goals of care discussion: DNR  Family Communication: family was present at bedside, at the time of interview.  The pt provided permission to discuss medical plan with the family. Opportunity was given to ask question and all questions were answered satisfactorily.   Disposition:  Pt is from [place], admitted with fall and right hip fracture, s/p ORIF done on 1/3, PT and OT eval done, recommend SNF, Hb low, patient will receive 1 unit of PRBC transfusion today, which precludes a safe discharge. Discharge to SNF, bed will be available.  Can be discharged tomorrow  Subjective:  Patient remained delirious since yesterday evening, Seroquel has been started but is still patient is very disoriented and confused unable to offer any complaints, seems to be resting comfortably. Discussed with patient's daughter at bedside. We will continue current treatment  Physical  Exam: General: Confused and disoriented, AAO x0 .  Appear in no distress, affect confused Eyes: PERRLA ENT: Oral Mucosa Clear, moist  Neck: nop JVD,  Cardiovascular: S1 and S2 Present, no Murmur,  Respiratory: good respiratory effort, Bilateral Air entry equal and Decreased, no Crackles, no wheezes Abdomen: Bowel Sound present, Soft and no tenderness,  Skin: no rashes Extremities: no Pedal edema, no calf tenderness Neurologic: without any new focal findings Gait not checked due to patient safety concerns  Vitals:   11/08/21 2007 11/08/21 2321 11/09/21 0822 11/09/21 1130  BP: (!) 128/53 140/85 (!) 158/67 (!) 166/69  Pulse: 98 94 100 (!) 103  Resp: 15 15 16 16   Temp: 98.4 F (36.9 C) (!) 97.4 F (36.3 C) 98.6 F (37 C) 98.1 F (36.7 C)  TempSrc:      SpO2: 93% 94% 92% 92%  Weight:      Height:        Intake/Output Summary (Last 24 hours) at 11/09/2021 1545 Last data filed at 11/09/2021 0900 Gross per 24 hour  Intake 1574 ml  Output --  Net 1574 ml   Filed Weights   11/16/2021 2126 12/04/2021 0954  Weight: 36.3 kg 36.3 kg    Data Reviewed: I have personally reviewed and interpreted daily labs, tele strips, imagings as discussed above. I reviewed all nursing notes, pharmacy notes, vitals, pertinent old records I have discussed plan of care as described above with RN and patient/family.  CBC: Recent Labs  Lab 11/04/2021 2351 11/25/2021 0554 11/07/21 0546 11/08/21 0522 11/09/21 0603  WBC 13.2* 11.4* 10.8* 13.0* 14.1*  NEUTROABS 11.4*  --   --   --   --   HGB 9.6* 8.2* 7.7* 7.2* 9.4*  HCT 29.9* 25.5* 24.3* 22.8* 27.9*  MCV 97.7 97.3 100.4* 97.4 92.7  PLT 384 318 271 274 407   Basic Metabolic Panel: Recent Labs  Lab 11/19/2021 2351 12/02/2021 0554 11/07/21 0546 11/08/21 0522 11/09/21 0603  NA 138 140 140 139 137  K 5.2* 5.0 5.2* 4.4 4.5  CL 107 111 110 108 107  CO2 22 22 21* 20* 18*  GLUCOSE 116* 121* 114* 126* 136*  BUN 64* 56* 44* 47* 45*  CREATININE 2.67* 2.38*  2.23* 2.33* 2.22*  CALCIUM 9.0 8.8* 8.4* 8.6* 8.9  MG  --   --  1.8 1.9 1.8  PHOS  --   --  4.1 4.2 4.3    Studies: DG Chest 2 View  Result Date: 11/09/2021 CLINICAL DATA:  confusion, rule out infection EXAM: CHEST - 2 VIEW COMPARISON:  Multiple priors FINDINGS: The heart appears enlarged. Calcifications of the aortic arch. No pleural effusion. No pneumothorax. Increased interstitial opacities. No lobar consolidation. Osteopenia. IMPRESSION: The heart appears enlarged with increased interstitial prominence suggestive of mild pulmonary edema. No consolidative pneumonia. Electronically Signed   By: Albin Felling M.D.   On: 11/09/2021 09:10    Scheduled Meds:  amLODipine  10 mg Oral QHS   vitamin C  500 mg Oral Daily   busPIRone  7.5 mg Oral BID   docusate sodium  100 mg Oral BID   feeding supplement  237 mL Oral BID BM   heparin injection (subcutaneous)  5,000 Units Subcutaneous Q12H  iron polysaccharides  150 mg Oral Daily   latanoprost  1 drop Both Eyes QHS   LORazepam  0.5 mg Oral QHS   melatonin  5 mg Oral QHS   multivitamin with minerals  1 tablet Oral Daily   polyethylene glycol  17 g Oral Daily   QUEtiapine  12.5 mg Oral BID   sodium bicarbonate  650 mg Oral TID   Continuous Infusions:  sodium chloride Stopped (11/07/21 0830)   lactated ringers 250 mL/hr at 11/19/2021 1235   PRN Meds: acetaminophen, alum & mag hydroxide-simeth, bisacodyl, bisacodyl, guaiFENesin-dextromethorphan, menthol-cetylpyridinium **OR** phenol, morphine injection, ondansetron **OR** ondansetron (ZOFRAN) IV, torsemide  Time spent: 35 minutes  Author: Val Riles. MD [facility] 11/09/2021 3:45 PM  To reach On-call, see care teams to locate the attending and reach out to them via [email or web address]. If 7PM-7AM, please contact night-coverage If you still have difficulty reaching the attending provider, please page the [facility] (Director on Call) for [facility] on amion for assistance.

## 2021-11-09 NOTE — Progress Notes (Addendum)
PT Cancellation Note  Patient Details Name: April Green MRN: [id number] DOB: 1923/02/17   Cancelled Treatment:     PT attempted 3 x this date. Pt has had a change in cognition in past 24 hrs and did not sleep at all last night. She is very confused, currently oriented to self only. Attempting to climb out of bed and very fidgety. RN issuing pain meds. Author will return tomorrow and continue to follow per current POC. SNF at DC still most appropriate.      Willette Pa 11/09/2021, 2:59 PM

## 2021-11-09 NOTE — Care Management Important Message (Signed)
Important Message  Patient Details  Name: April Green MRN: [id number] Date of Birth: [birth year]   Medicare Important Message Given:  Yes     April Green 11/09/2021, 11:35 AM

## 2021-11-09 NOTE — Progress Notes (Signed)
Occupational Therapy Treatment Patient Details Name: April Green MRN: [id number] DOB: 1923-07-11 Today's Date: 11/09/2021   History of present illness 88 yo F who presented to ER secondary to R hip pain after mechanical fall; admitted for management of R displaced, comminuted hip fracture s/p IM nailing (11/08/2021), WBAT.   OT comments  [name] is highly agitated today, oriented to self only. She cannot say where she is, does not recognize that she has had surgery, cannot identify her niece who is sitting next to her, cannot follow simple one-step directions. She is pulling at bedding and clothing and makes repeated attempts to get out of bed. She makes multiple nonsensical statements/questions during session (e.g., "Am I getting too much peanut better?"). She requires Min-Mod A for bed mobility, transfers, and standing, demonstrating fair-poor balance, and needs close SUPV for safety due to impulsivity and agitation. Pt denies pain today and make few visible indications that she has pain, although does grimace on occasion with RLE movement. Left pt in bed with alarm activated and family member in room. Pt tucked into blankets, eyeglasses removed, lights dimmed, and pillows positioned for sleep; therapist encouraged pt to try to rest. Per family member present, pt did not sleep at all last night or today. Pt appears to be breathing heavily and coughing at times, SPO2 sats 92-93%, HR 110 in supine. Will continue to offer rehab services while pt is hospitalized, with DC to SNF.   Recommendations for follow up therapy are one component of a multi-disciplinary discharge planning process, led by the attending physician.  Recommendations may be updated based on patient status, additional functional criteria and insurance authorization.    Follow Up Recommendations  Skilled nursing-short term rehab (<3 hours/day)    Assistance Recommended at Discharge Frequent or constant Supervision/Assistance  Patient  can return home with the following  A lot of help with walking and/or transfers;A lot of help with bathing/dressing/bathroom;Assistance with cooking/housework;Assist for transportation;Direct supervision/assist for medications management   Equipment Recommendations       Recommendations for Other Services      Precautions / Restrictions Precautions Precautions: Fall Restrictions Weight Bearing Restrictions: Yes RLE Weight Bearing: Weight bearing as tolerated       Mobility Bed Mobility Overal bed mobility: Needs Assistance Bed Mobility: Supine to Sit;Sit to Supine     Supine to sit: Min assist Sit to supine: Mod assist        Transfers Overall transfer level: Needs assistance Equipment used: Rolling walker (2 wheels) Transfers: Sit to/from Stand Sit to Stand: Min assist                 Balance Overall balance assessment: Needs assistance Sitting-balance support: No upper extremity supported;Feet supported Sitting balance-Leahy Scale: Fair   Postural control: Posterior lean;Left lateral lean Standing balance support: Bilateral upper extremity supported;During functional activity Standing balance-Leahy Scale: Poor Standing balance comment: frequent cueing for safety                           ADL either performed or assessed with clinical judgement   ADL Overall ADL's : Needs assistance/impaired                                            Extremity/Trunk Assessment Upper Extremity Assessment Upper Extremity Assessment: Overall WFL for tasks assessed  Lower Extremity Assessment Lower Extremity Assessment: Generalized weakness        Vision       Perception     Praxis      Cognition Arousal/Alertness: Awake/alert Behavior During Therapy: Restless;Agitated Overall Cognitive Status: Impaired/Different from baseline                                 General Comments: Oriented to self only.           Exercises Other Exercises Other Exercises: UE therex in supine; cognitive re-orientation, sitting and standing balance tolerance   Shoulder Instructions       General Comments      Pertinent Vitals/ Pain       Pain Assessment: No/denies pain Breathing: occasional labored breathing, short period of hyperventilation Negative Vocalization: none Facial Expression: sad, frightened, frown Body Language: tense, distressed pacing, fidgeting Consolability: unable to console, distract or reassure PAINAD Score: 5 Pain Intervention(s): Repositioned  Home Living                                          Prior Functioning/Environment              Frequency  Min 2X/week        Progress Toward Goals  OT Goals(current goals can now be found in the care plan section)  Progress towards OT goals: Progressing toward goals  Acute Rehab OT Goals Patient Stated Goal: to get out of bed OT Goal Formulation: With patient Time For Goal Achievement: 11/21/21 Potential to Achieve Goals: Good  Plan Discharge plan remains appropriate;Frequency remains appropriate    Co-evaluation                 AM-PAC OT "6 Clicks" Daily Activity     Outcome Measure   Help from another person eating meals?: A Little Help from another person taking care of personal grooming?: A Little Help from another person toileting, which includes using toliet, bedpan, or urinal?: A Lot Help from another person bathing (including washing, rinsing, drying)?: A Lot Help from another person to put on and taking off regular upper body clothing?: A Little Help from another person to put on and taking off regular lower body clothing?: A Lot 6 Click Score: 15    End of Session Equipment Utilized During Treatment: Rolling walker (2 wheels)  OT Visit Diagnosis: Unsteadiness on feet (R26.81);History of falling (Z91.81);Muscle weakness (generalized) (M62.81)   Activity Tolerance Treatment limited  secondary to agitation   Patient Left in bed;with call bell/phone within reach;with bed alarm set;with family/visitor present   Nurse Communication          Time: 1749-4496 OT Time Calculation (min): 26 min  Charges: OT Treatments $Self Care/Home Management : 23-37 mins  Josiah Lobo, PhD, MS, OTR/L 11/09/21, 2:19 PM

## 2021-11-09 NOTE — Progress Notes (Signed)
Patient from 1A. Alert and unable to follow commands. Foley present. PIV present. Patient was NT suctioned upon arrival and a large amount of thick secretions were present. Patient placed on BIPAP upon arrival CHG bath completed. Patient positioned in a position of comfort. Family and private sitter at bedside.

## 2021-11-10 DIAGNOSIS — S72001A Fracture of unspecified part of neck of right femur, initial encounter for closed fracture: Secondary | ICD-10-CM | POA: Diagnosis not present

## 2021-11-10 MED ORDER — ATROPINE SULFATE 1 % OP SOLN
2.0000 [drp] | Freq: Four times a day (QID) | OPHTHALMIC | Status: DC
  Filled 2021-11-10: qty 2

## 2021-11-11 LAB — URINE CULTURE

## 2021-11-12 LAB — GLUCOSE, CAPILLARY: Glucose-Capillary: 144 mg/dL — ABNORMAL HIGH (ref 70–99)

## 2021-12-05 NOTE — Progress Notes (Signed)
Met with April Green. Explained her aunt  would need to be seen by the Medical Examiner because of her injury. Explained would be external examination only and ME would see her this afternoon. Gave her my card if she had any questions. Stated she did not want to be contacted once her aunt is released. Contacted [facility], explained about need to be seen by ME, so release would be delayed until this afternoon and we would call them as soon as released.

## 2021-12-05 NOTE — Progress Notes (Signed)
Patient transferred to room 133 via hospital bed. Private sitter at the bedside. Lightly orally suction thick mucous from mouth. Repositioned patient and fluffed pillows on each side. Wrapped PIV with soft gauze. Patient is pulling at oxygen line at times. Currently resting on and off. Will continue to monitor to end of shift and give report to oncoming nurse.

## 2021-12-05 NOTE — Discharge Summary (Signed)
[facility] Discharge Summary   Patient: April Green ZJI:[id number]  PCP: [name], MD  Date of admission: 11/26/2021   Date of discharge:  Dec 05, 2021    Death summary Death certificate is done,  case ID [id number]    Discharge Diagnoses:  Principal Problem:   Hip fracture, right ([place]) Active Problems:   UPJ obstruction, congenital   Essential hypertension   Leukocytosis   Protein-calorie malnutrition, severe   CKD (chronic kidney disease) stage 4, GFR 15-29 ml/min ([place])   Acute respiratory distress   Admitted From: ALF Disposition:  Deceased   Recommendations for Outpatient Follow-up:  PCP: Patient expired Follow up LABS/TEST:     Contact information for after-discharge care     Destination     [facility] .   Service: Skilled Nursing Contact information: [place] [phone]                    Diet recommendation: Patient expired  Activity: Patient expired Discharge Condition: Patient expired  Code Status: DNR   History of present illness: As per the H and P dictated on admission Hospital Course:  April Green is a 88 y.o. female with medical history significant for hypertension, depression, anxiety, insomnia, hard of hearing, who presents to the emergency department for chief concerns of right hip pain. ED Course: Discussed with EDP, patient requiring hospitalization for chief concerns of right proximal femur fracture.  Vitals in the ED was stable with BP of 152/69. In the ED, she was given one dose of fentanyl 25 mcg IVP.  Labs in the ED showed K of 5.2, nonfasting glucose of 116, bun 64, sCr of 2.67, gfr 16, wbc of 13.2, hgb 9.6, platelets of 384. Assessment and Plan: Acute delirium secondary to advanced age and dementia and dementia, s/p Seroquel 12 point milligram p.o. twice daily and Haldol IV as needed, s/p melatonin 5 mg nightly for sleep which may help with  delirium Right comminuted displaced intertrochanteric proximal femur fracture - presumed secondary to mechanical fall, s/p tylenol for pain control. Orthopedic consulted, s/p ORIF/intramedullary nail intertrochanteric done on 1/3, PT OT eval was done, plan was to discharge patient to SNF after improvement. UTI, UA positive, urine culture grew multiple species, recommended recollection. S/p ceftriaxone 1 g IV daily, repeat urine culture negative.  Discontinued antibiotics. HTN - s/p amlodipine 10 mg qhs and Hydralazine 5 mg IV q6h prn for sbp > 180 CKD4 - at baseline   Iron deficiency anemia, on 1/5 Hb 7.2 and the patient was tired and could not work with physical therapy, symptomatic anemia. 1/5 transfused 1 unit of PRBC, Hb 9.4 posttransfusion, WBC 14 K elevated could be secondary to blood transfusion, we will continue to monitor CBC daily. S/p oral iron supplement and vitamin C congenital right UPJ obstruction and a chronic indwelling metallic stent  Patient follows urology as an outpatient, No active issues Hyperkalemia, Lokelma 10g x1 dose given.  Potassium 4.5, resolved. Chest congestion as per patient's daughter, x-ray chest ordered, Started Robitussin-DM as needed for cough. Use incentive spirometry and flutter valve if needed Overnight patient developed shortness of breath and hypoxia sporty failure, patient was unable to swallow her own secretions, patient was placed on a BiPAP but family wanted comfort measures.  The patient was on morphine and atropine sublingual drops were ordered for secretions. Patient expired on 2021/12/05 at 11:29 AM, death was pronounced by RN.  Cause of death hypoxic respiratory  failure secondary to unable to swallow her own secretions and possible aspiration.   Body mass index is 15.12 kg/m.  Nutrition Problem: Increased nutrient needs Etiology: post-op healing Nutrition Interventions: Interventions: Ensure Enlive (each supplement provides 350kcal and 20 grams  of protein), MVI Patient was seen by physical therapy, who recommended SNF  Consultants: Orthopedics Procedures: /p ORIF/intramedullary nail intertrochanteric done on 1/3,   Discharge Exam: General: Patient was seen during morning rounds, patient remained confused and completely disoriented. no Rash; Oral Mucosa Clear, moist. Cardiovascular: S1 and S2 Present, no Murmur, Respiratory: Increased respiratory effort, Bilateral Air entry present and bilateral crackles secondary to secretions.  No significant wheezing appreciated  Abdomen: Bowel Sound present, Soft and no tenderness, no hernia Extremities: no Pedal edema, no calf tenderness Neurology: Confused and not oriented to time, place, and person affect flat in affect.  Filed Weights   11/07/2021 2126 11/11/2021 0954  Weight: 36.3 kg 36.3 kg   Vitals:   November 27, 2021 0734 11/27/21 0900  BP: 133/63 (!) 119/42  Pulse: (!) 114 97  Resp: (!) 24 20  Temp: (!) 100.6 F (38.1 C) 97.7 F (36.5 C)  SpO2: (!) 70% (!) 75%    DISCHARGE MEDICATION: Allergies as of 11-27-2021       Reactions   Sulfa Antibiotics Anaphylaxis   Amoxicillin-pot Clavulanate Diarrhea, Nausea Only   Stomach pains   Azithromycin Nausea Only, Other (See Comments)   Confusion also   Diphenhydramine Other (See Comments)   Confusion   Trazodone And Nefazodone Other (See Comments)   Disorientation and sleep deprivation   Lokelma [sodium Zirconium Cyclosilicate] Other (See Comments)   edema   5-methoxypsoralen Other (See Comments)   Altered mental status Used to treat psoriasis and/or vitiligo   Aspirin Other (See Comments)   EARS START TO RING   Ceftriaxone Nausea Only   Flexeril [cyclobenzaprine] Nausea Only   Levofloxacin Other (See Comments)   Causes pain in calf of leg when walking Levaquin   Mirtazapine Other (See Comments)   Dizzy   Oxycodone Other (See Comments)   Causes heavy drowsiness     Med Rec must be completed prior to using this  [place]  Patient expired    Allergies  Allergen Reactions   Sulfa Antibiotics Anaphylaxis   Amoxicillin-Pot Clavulanate Diarrhea and Nausea Only    Stomach pains   Azithromycin Nausea Only and Other (See Comments)    Confusion also   Diphenhydramine Other (See Comments)    Confusion   Trazodone And Nefazodone Other (See Comments)    Disorientation and sleep deprivation   Lokelma [Sodium Zirconium Cyclosilicate] Other (See Comments)    edema   5-Methoxypsoralen Other (See Comments)    Altered mental status Used to treat psoriasis and/or vitiligo   Aspirin Other (See Comments)    EARS START TO RING   Ceftriaxone Nausea Only   Flexeril [Cyclobenzaprine] Nausea Only   Levofloxacin Other (See Comments)    Causes pain in calf of leg when walking  Levaquin   Mirtazapine Other (See Comments)    Dizzy   Oxycodone Other (See Comments)    Causes heavy drowsiness     The results of significant diagnostics from this hospitalization (including imaging, microbiology, ancillary and laboratory) are listed below for reference.    Significant Diagnostic Studies: DG Chest 2 View  Result Date: 11/09/2021 CLINICAL DATA:  confusion, rule out infection EXAM: CHEST - 2 VIEW COMPARISON:  Multiple priors FINDINGS: The heart appears enlarged. Calcifications of the aortic arch. No  pleural effusion. No pneumothorax. Increased interstitial opacities. No lobar consolidation. Osteopenia. IMPRESSION: The heart appears enlarged with increased interstitial prominence suggestive of mild pulmonary edema. No consolidative pneumonia. Electronically Signed   By: Albin Felling M.D.   On: 11/09/2021 09:10   DG Shoulder Right  Result Date: 11/22/2021 CLINICAL DATA:  Status post fall. EXAM: RIGHT SHOULDER - 2+ VIEW COMPARISON:  None. FINDINGS: There is no evidence of acute fracture or dislocation. Mild chronic changes seen along the greater tubercle of the right humeral head. Mild degenerative changes are seen  involving the right glenohumeral articulation. Soft tissues are unremarkable. IMPRESSION: 1. No acute fracture or dislocation. 2. Mild degenerative changes. Electronically Signed   By: Virgina Norfolk M.D.   On: 11/11/2021 22:31   DG Elbow 2 Views Left  Result Date: 10/20/2021 CLINICAL DATA:  Unwitnessed fall. Pain to left elbow. Skin tear noted at the posterior aspect of left elbow. EXAM: LEFT ELBOW - 2 VIEW COMPARISON:  None. FINDINGS: There is no evidence of fracture or dislocation. Small elbow joint effusion. Soft tissue defect overlying the olecranon likely corresponds to reported skin tear. IMPRESSION: No acute osseous abnormality.  Small elbow joint effusion. Electronically Signed   By: Ileana Roup M.D.   On: 10/20/2021 21:04   CT Head Wo Contrast  Result Date: 12/02/2021 CLINICAL DATA:  Status post fall. EXAM: CT HEAD WITHOUT CONTRAST TECHNIQUE: Contiguous axial images were obtained from the base of the skull through the vertex without intravenous contrast. COMPARISON:  None. FINDINGS: Brain: There is mild to moderate severity cerebral atrophy with widening of the extra-axial spaces and ventricular dilatation. There are areas of decreased attenuation within the white matter tracts of the supratentorial brain, consistent with microvascular disease changes. Vascular: No hyperdense vessel or unexpected calcification. Skull: Normal. Negative for fracture or focal lesion. Sinuses/Orbits: No acute finding. Other: None. IMPRESSION: 1. Generalized cerebral atrophy and microvascular disease changes of the supratentorial brain. 2. No acute intracranial abnormality. Electronically Signed   By: Virgina Norfolk M.D.   On: 11/29/2021 22:00   CT HEAD WO CONTRAST (5MM)  Result Date: 10/20/2021 CLINICAL DATA:  Unwitnessed fall. EXAM: CT HEAD WITHOUT CONTRAST TECHNIQUE: Contiguous axial images were obtained from the base of the skull through the vertex without intravenous contrast. COMPARISON:  October 03, 2021 FINDINGS: Brain: There is mild to moderate severity cerebral atrophy with widening of the extra-axial spaces and ventricular dilatation. There are areas of decreased attenuation within the white matter tracts of the supratentorial brain, consistent with microvascular disease changes. Vascular: No hyperdense vessel or unexpected calcification. Skull: Normal. Negative for fracture or focal lesion. Sinuses/Orbits: No acute finding. Other: Mild scalp soft tissue swelling is seen along the posterior aspect of the vertex on the left. IMPRESSION: 1. Mild scalp soft tissue swelling along the posterior aspect of the vertex on the left, without evidence of an acute fracture or acute intracranial abnormality. 2. Cerebral atrophy and microvascular disease changes of the supratentorial brain. Electronically Signed   By: Virgina Norfolk M.D.   On: 10/20/2021 20:45   CT Cervical Spine Wo Contrast  Result Date: 11/08/2021 CLINICAL DATA:  Status post fall. EXAM: CT CERVICAL SPINE WITHOUT CONTRAST TECHNIQUE: Multidetector CT imaging of the cervical spine was performed without intravenous contrast. Multiplanar CT image reconstructions were also generated. COMPARISON:  October 03, 2021 FINDINGS: Alignment: There is approximately 2 mm anterolisthesis of the C7 vertebral body on T1. Skull base and vertebrae: No acute fracture. No primary bone lesion or focal  pathologic process. Soft tissues and spinal canal: No prevertebral fluid or swelling. No visible canal hematoma. Disc levels: Mild multilevel endplate sclerosis is seen throughout the cervical spine. This is most prominent at the levels of C3-C4, C4-C5 and C5-C6. There is marked severity narrowing of the anterior atlantoaxial articulation. Marked severity posterior intervertebral disc space narrowing is seen at the levels of C2-C3, C3-C4, C4-C5, C5-C6 and C6-C7. Bilateral moderate to marked severity multilevel facet joint hypertrophy is noted. Upper chest: Negative. Other:  None. IMPRESSION: 1. No acute fracture within the cervical spine. 2. Marked severity multilevel degenerative changes, most prominent at the levels of C2-C3, C3-C4, C4-C5, C5-C6 and C6-C7. Electronically Signed   By: Virgina Norfolk M.D.   On: 11/17/2021 22:04   DG Chest Port 1 View  Result Date: 11/09/2021 CLINICAL DATA:  Acute respiratory distress. EXAM: PORTABLE CHEST 1 VIEW COMPARISON:  Chest radiograph dated 11/09/2021. FINDINGS: Mild diffuse interstitial prominence and nodularity which may represent edema, although atypical pneumonia is not excluded clinical correlation is recommended. No consolidative changes. No large pleural effusion or pneumothorax. Minimal left lung base atelectasis. Stable mild cardiomegaly. Atherosclerotic calcification of the aorta. No acute osseous pathology. IMPRESSION: Mild diffuse interstitial prominence and nodularity may represent edema. Atypical pneumonia is not excluded. Electronically Signed   By: Anner Crete M.D.   On: 11/09/2021 20:03   DG Chest Port 1 View  Result Date: 11/08/2021 CLINICAL DATA:  Short of breath, hip surgery 2 days ago, confusion EXAM: PORTABLE CHEST 1 VIEW COMPARISON:  11/15/2021 FINDINGS: Single frontal view of the chest demonstrates a stable cardiac silhouette and calcification of the mitral annulus. No acute airspace disease, effusion, or pneumothorax. There are no acute bony abnormalities. IMPRESSION: 1. Stable chest, no acute process. Electronically Signed   By: Randa Ngo M.D.   On: 11/08/2021 15:27   DG Chest Portable 1 View  Result Date: 11/04/2021 CLINICAL DATA:  Preop.  Right hip fracture.  Mechanical fall. EXAM: PORTABLE CHEST 1 VIEW COMPARISON:  Radiograph 10/03/2021 FINDINGS: The cardiomediastinal contours are normal. Atherosclerosis of the aortic arch. Borderline hyperinflation which is similar to prior exam. Pulmonary vasculature is normal. No consolidation, pleural effusion, or pneumothorax. No acute osseous abnormalities  are seen. IMPRESSION: No acute chest findings. Electronically Signed   By: Keith Rake M.D.   On: 12/03/2021 23:30   DG C-Arm 1-60 Min-No Report  Result Date: 11/22/2021 Fluoroscopy was utilized by the requesting physician.  No radiographic interpretation.   DG Hip Unilat W or Wo Pelvis 2-3 Views Right  Result Date: 11/26/2021 CLINICAL DATA:  Post fall with right hip pain. Tripped while walking with walker. EXAM: DG HIP (WITH OR WITHOUT PELVIS) 2-3V RIGHT COMPARISON:  Pelvis and left hip radiograph 01/01/2019 FINDINGS: Acute intertrochanteric right proximal femur fracture. There is fracture involvement of the greater and lesser trochanters with minimal proximal migration of the femoral shaft. Femoral head remains seated in the acetabulum. Mild chronic regularity of the right pubic body. The bones are diffusely under mineralized. Right-sided double-J stent in place, the proximal pigtail is at the level of L4, the distal pigtail in the left pelvis. IMPRESSION: 1. Acute intertrochanteric right proximal femur fracture with minimal proximal migration of the femoral shaft. 2. Right double-J stent in place, proximal pigtail at the level of L4, possibly in the ureter, though not well delineated by radiograph. Electronically Signed   By: Keith Rake M.D.   On: 11/07/2021 22:34   DG FEMUR, MIN 2 VIEWS RIGHT  Result Date:  12/03/2021 CLINICAL DATA:  Fluoroscopic assistance for internal fixation of fracture of right femur EXAM: RIGHT FEMUR 2 VIEWS COMPARISON:  11/29/2021 FINDINGS: There is interval internal fixation of comminuted intertrochanteric fracture of right femur with intramedullary rod and a large caliber screw in the neck. IMPRESSION: Status post internal fixation of comminuted intertrochanteric fracture of right femur. Electronically Signed   By: Elmer Picker M.D.   On: 11/24/2021 12:39   DG FEMUR, MIN 2 VIEWS RIGHT  Result Date: 12/03/2021 CLINICAL DATA:  Post fall with right hip pain.  Tripped while walking with walker. EXAM: RIGHT FEMUR 2 VIEWS COMPARISON:  None. FINDINGS: Comminuted proximal femur fracture with inter trochanteric involvement, fracture involving both lesser and greater trochanters. Mild proximal migration of the femoral shaft. There is nondisplaced subtrochanteric involvement through the central aspect of the proximal femoral shaft approaching the medial cortex. The more distal femur is intact. Soft tissues appear a trophic. IMPRESSION: Comminuted displaced intertrochanteric proximal femur fracture. There is nondisplaced subtrochanteric involvement through the central aspect of the proximal femoral shaft. Electronically Signed   By: Keith Rake M.D.   On: 11/26/2021 22:36    Microbiology: Recent Results (from the past 240 hour(s))  Resp Panel by RT-PCR (Flu A&B, Covid) Nasopharyngeal Swab     Status: None   Collection Time: 11/30/2021 11:51 PM   Specimen: Nasopharyngeal Swab; Nasopharyngeal(NP) swabs in vial transport medium  Result Value Ref Range Status   SARS Coronavirus 2 by RT PCR NEGATIVE NEGATIVE Final    Comment: (NOTE) SARS-CoV-2 target nucleic acids are NOT DETECTED.  The SARS-CoV-2 RNA is generally detectable in upper respiratory specimens during the acute phase of infection. The lowest concentration of SARS-CoV-2 viral copies this assay can detect is 138 copies/mL. A negative result does not preclude SARS-Cov-2 infection and should not be used as the sole basis for treatment or other patient management decisions. A negative result may occur with  improper specimen collection/handling, submission of specimen other than nasopharyngeal swab, presence of viral mutation(s) within the areas targeted by this assay, and inadequate number of viral copies(<138 copies/mL). A negative result must be combined with clinical observations, patient history, and epidemiological information. The expected result is Negative.  Fact Sheet for Patients:   EntrepreneurPulse.com.au  Fact Sheet for Healthcare Providers:  IncredibleEmployment.be  This test is no t yet approved or cleared by the [place] FDA and  has been authorized for detection and/or diagnosis of SARS-CoV-2 by FDA under an Emergency Use Authorization (EUA). This EUA will remain  in effect (meaning this test can be used) for the duration of the COVID-19 declaration under Section 564(b)(1) of the Act, 21 U.S.C.section 360bbb-3(b)(1), unless the authorization is terminated  or revoked sooner.       Influenza A by PCR NEGATIVE NEGATIVE Final   Influenza B by PCR NEGATIVE NEGATIVE Final    Comment: (NOTE) The Xpert Xpress SARS-CoV-2/FLU/RSV plus assay is intended as an aid in the diagnosis of influenza from Nasopharyngeal swab specimens and should not be used as a sole basis for treatment. Nasal washings and aspirates are unacceptable for Xpert Xpress SARS-CoV-2/FLU/RSV testing.  Fact Sheet for Patients: EntrepreneurPulse.com.au  Fact Sheet for Healthcare Providers: IncredibleEmployment.be  This test is not yet approved or cleared by the [place] FDA and has been authorized for detection and/or diagnosis of SARS-CoV-2 by FDA under an Emergency Use Authorization (EUA). This EUA will remain in effect (meaning this test can be used) for the duration of the COVID-19 declaration under Section  564(b)(1) of the Act, 21 U.S.C. section 360bbb-3(b)(1), unless the authorization is terminated or revoked.  Performed at [facility], [address] [place], [place] [address]   Urine Culture     Status: Abnormal   Collection Time: 11/18/2021  3:17 AM   Specimen: Urine, Clean Catch  Result Value Ref Range Status   Specimen Description   Final    URINE, CLEAN CATCH Performed at [facility], [address] [place], [place] [address]    Special Requests   Final     NONE Performed at [facility], [address] [place], [place] [address]    Culture MULTIPLE SPECIES PRESENT, SUGGEST RECOLLECTION (A)  Final   Report Status 11/07/2021 FINAL  Final  Urine Culture     Status: None   Collection Time: 11/07/21  2:40 PM   Specimen: Urine, Random  Result Value Ref Range Status   Specimen Description   Final    URINE, RANDOM Performed at [facility], [address], [place], [place] [address]    Special Requests   Final    NONE Performed at [facility], [address] [place], [place] [address]    Culture   Final    NO GROWTH Performed at [facility], [place] [address] [place], [place] [address]    Report Status 11/09/2021 FINAL  Final     Labs: CBC: Recent Labs  Lab 11/20/2021 2351 11/24/2021 0554 11/07/21 0546 11/08/21 0522 11/09/21 0603  WBC 13.2* 11.4* 10.8* 13.0* 14.1*  NEUTROABS 11.4*  --   --   --   --   HGB 9.6* 8.2* 7.7* 7.2* 9.4*  HCT 29.9* 25.5* 24.3* 22.8* 27.9*  MCV 97.7 97.3 100.4* 97.4 92.7  PLT 384 318 271 274 952   Basic Metabolic Panel: Recent Labs  Lab 11/07/2021 2351 11/16/2021 0554 11/07/21 0546 11/08/21 0522 11/09/21 0603  NA 138 140 140 139 137  K 5.2* 5.0 5.2* 4.4 4.5  CL 107 111 110 108 107  CO2 22 22 21* 20* 18*  GLUCOSE 116* 121* 114* 126* 136*  BUN 64* 56* 44* 47* 45*  CREATININE 2.67* 2.38* 2.23* 2.33* 2.22*  CALCIUM 9.0 8.8* 8.4* 8.6* 8.9  MG  --   --  1.8 1.9 1.8  PHOS  --   --  4.1 4.2 4.3   Liver Function Tests: Recent Labs  Lab 11/15/2021 2351  AST 19  ALT 18  ALKPHOS 135*  BILITOT 0.5  PROT 7.1  ALBUMIN 3.7   No results for input(s): LIPASE, AMYLASE in the last 168 hours. No results for input(s): AMMONIA in the last 168 hours. Cardiac Enzymes: No results for input(s): CKTOTAL, CKMB, CKMBINDEX, TROPONINI in the last 168 hours. BNP (last 3 results) No results for input(s): BNP in the last 8760 hours. CBG: Recent Labs  Lab 11/09/21 1927  GLUCAP  190*    Time spent: 35 minutes  Signed:  Val Riles  [facility]  11-11-2021 12:39 PM

## 2021-12-05 NOTE — Progress Notes (Signed)
While assessing patient, writer noted patient had no reaction to verbal or tactile stimulation. Additionally, patient's pupils were fixed and dilated. Charge nurse notified. Apical pulse was auscultated for one minute and was noted by second verifier RN to be absent. Charge nurse Helen Hashimoto served as second Doctor, general practice. Official time of death: 11:29 a.m. No resuscitation efforts were initiated as patient has a signed DNR in chart. Patient's niece was bedside. Attending physician notified. Postmortem care will be completed, belongings will be returned to patient's family, donor services will be contacted, and notification will be made to the funeral home of the family's choice.

## 2021-12-05 NOTE — Progress Notes (Addendum)
Notified of patient death. After review of chart, noted patient had a hip fracture from a fall at her nursing home. Contacted the ME, he states she is a [employer] case. He states will see her this afternoon. ME is Izora Ribas.

## 2021-12-05 NOTE — Plan of Care (Signed)

## 2021-12-05 NOTE — Progress Notes (Signed)
° °  Subjective: 4 Days Post-Op Procedure(s) (LRB): INTRAMEDULLARY (IM) NAIL INTERTROCHANTRIC (Right) Patient with rapid response last night, gurgling and choking.  Taken to ICU and placed on BiPAP.  Family requested comfort care. Patient with delirium yesterday, seem to improve some.  Patient receiving Seroquel and melatonin to help with agitation. Patient is alert this morning, unable to respond    Objective: Vital signs in last 24 hours: Temp:  [98 F (36.7 C)-98.6 F (37 C)] 98 F (36.7 C) (01/06 1621) Pulse Rate:  [76-112] 106 (01/07 0542) Resp:  [16-29] 16 (01/07 0542) BP: (141-166)/(62-71) 141/62 (01/07 0542) SpO2:  [77 %-100 %] 77 % (01/07 0542)  Intake/Output from previous day: 01/06 0701 - 01/07 0700 In: 0  Out: 550 [Urine:550] Intake/Output this shift: No intake/output data recorded.  Recent Labs    11/08/21 0522 11/09/21 0603  HGB 7.2* 9.4*   Recent Labs    11/08/21 0522 11/09/21 0603  WBC 13.0* 14.1*  RBC 2.34* 3.01*  HCT 22.8* 27.9*  PLT 274 320   Recent Labs    11/08/21 0522 11/09/21 0603  NA 139 137  K 4.4 4.5  CL 108 107  CO2 20* 18*  BUN 47* 45*  CREATININE 2.33* 2.22*  GLUCOSE 126* 136*  CALCIUM 8.6* 8.9   No results for input(s): LABPT, INR in the last 72 hours.   EXAM General - Patient is Confused Extremity - Neurovascular intact Sensation intact distally Intact pulses distally Dorsiflexion/Plantar flexion intact Dressing - dressing C/D/I and no drainage Motor Function - intact, moving foot and toes well on exam.   Past Medical History:  Diagnosis Date   Acute cystitis    Anxiety    Cancer ([place])    skin cancer   Chronic UTI    Depression    Dyspnea    Dysrhythmia    tachy arrythmia   Glaucoma    History of kidney stones    Hydronephrosis    stage IV ckd   Hypertension    Incomplete bladder emptying    Macular degeneration    PONV (postoperative nausea and vomiting)    nausea only   Renal cyst    SUI (stress  urinary incontinence, female)    Unsteady gait    UPJ obstruction, congenital    Vertigo    Weight loss, unintentional 04/2021   having difficulty keeping weight on    Assessment/Plan:   4 Days Post-Op Procedure(s) (LRB): INTRAMEDULLARY (IM) NAIL INTERTROCHANTRIC (Right) Principal Problem:   Hip fracture, right (HCC) Active Problems:   UPJ obstruction, congenital   Essential hypertension   Leukocytosis   Protein-calorie malnutrition, severe   CKD (chronic kidney disease) stage 4, GFR 15-29 ml/min (HCC)   Acute respiratory distress  Estimated body mass index is 15.12 kg/m as calculated from the following:   Height as of this encounter: 5\' 1"  (1.549 m).   Weight as of this encounter: 36.3 kg. Advance diet Up with therapy Acute post op blood loss anemia -hemoglobin 9.4 status post 1 unit packed red blood cells  Family elected to initiate comfort care  CM to assist with discharge  DVT Prophylaxis - TED hose and SCDs, Heparin Weight-Bearing as tolerated to right leg   T. Rachelle Hora, PA-C [place] Dec 04, 2021, 7:13 AM

## 2021-12-05 NOTE — TOC Transition Note (Signed)
Transition of Care [facility]) - CM/SW Discharge Note   Patient Details  Name: April Green MRN: [id number] Date of Birth: 02-04-1923  [facility] of [facility]) CM/SW Contact:  Harriet Masson, RN Phone [phone] December 07, 2021, 3:10 PM   Clinical Narrative:     RN has alerted the accepting facility [facility]) that pt has expired.        Patient Goals and CMS Choice        Discharge Placement                       Discharge Plan and Services                                     Social Determinants of Health (SDOH) Interventions     Readmission Risk Interventions No flowsheet data found.

## 2021-12-05 DEATH — deceased

## 2022-03-12 ENCOUNTER — Ambulatory Visit: Payer: Self-pay | Admitting: Urology

## 2023-07-20 IMAGING — DX DG CHEST 1V PORT
1 series · 1 of 1 positions shown · non-contrast
Comparison: Chest radiograph dated 11/09/2021.

CLINICAL DATA: Acute respiratory distress.

EXAM:
PORTABLE CHEST 1 VIEW

[chest ap]
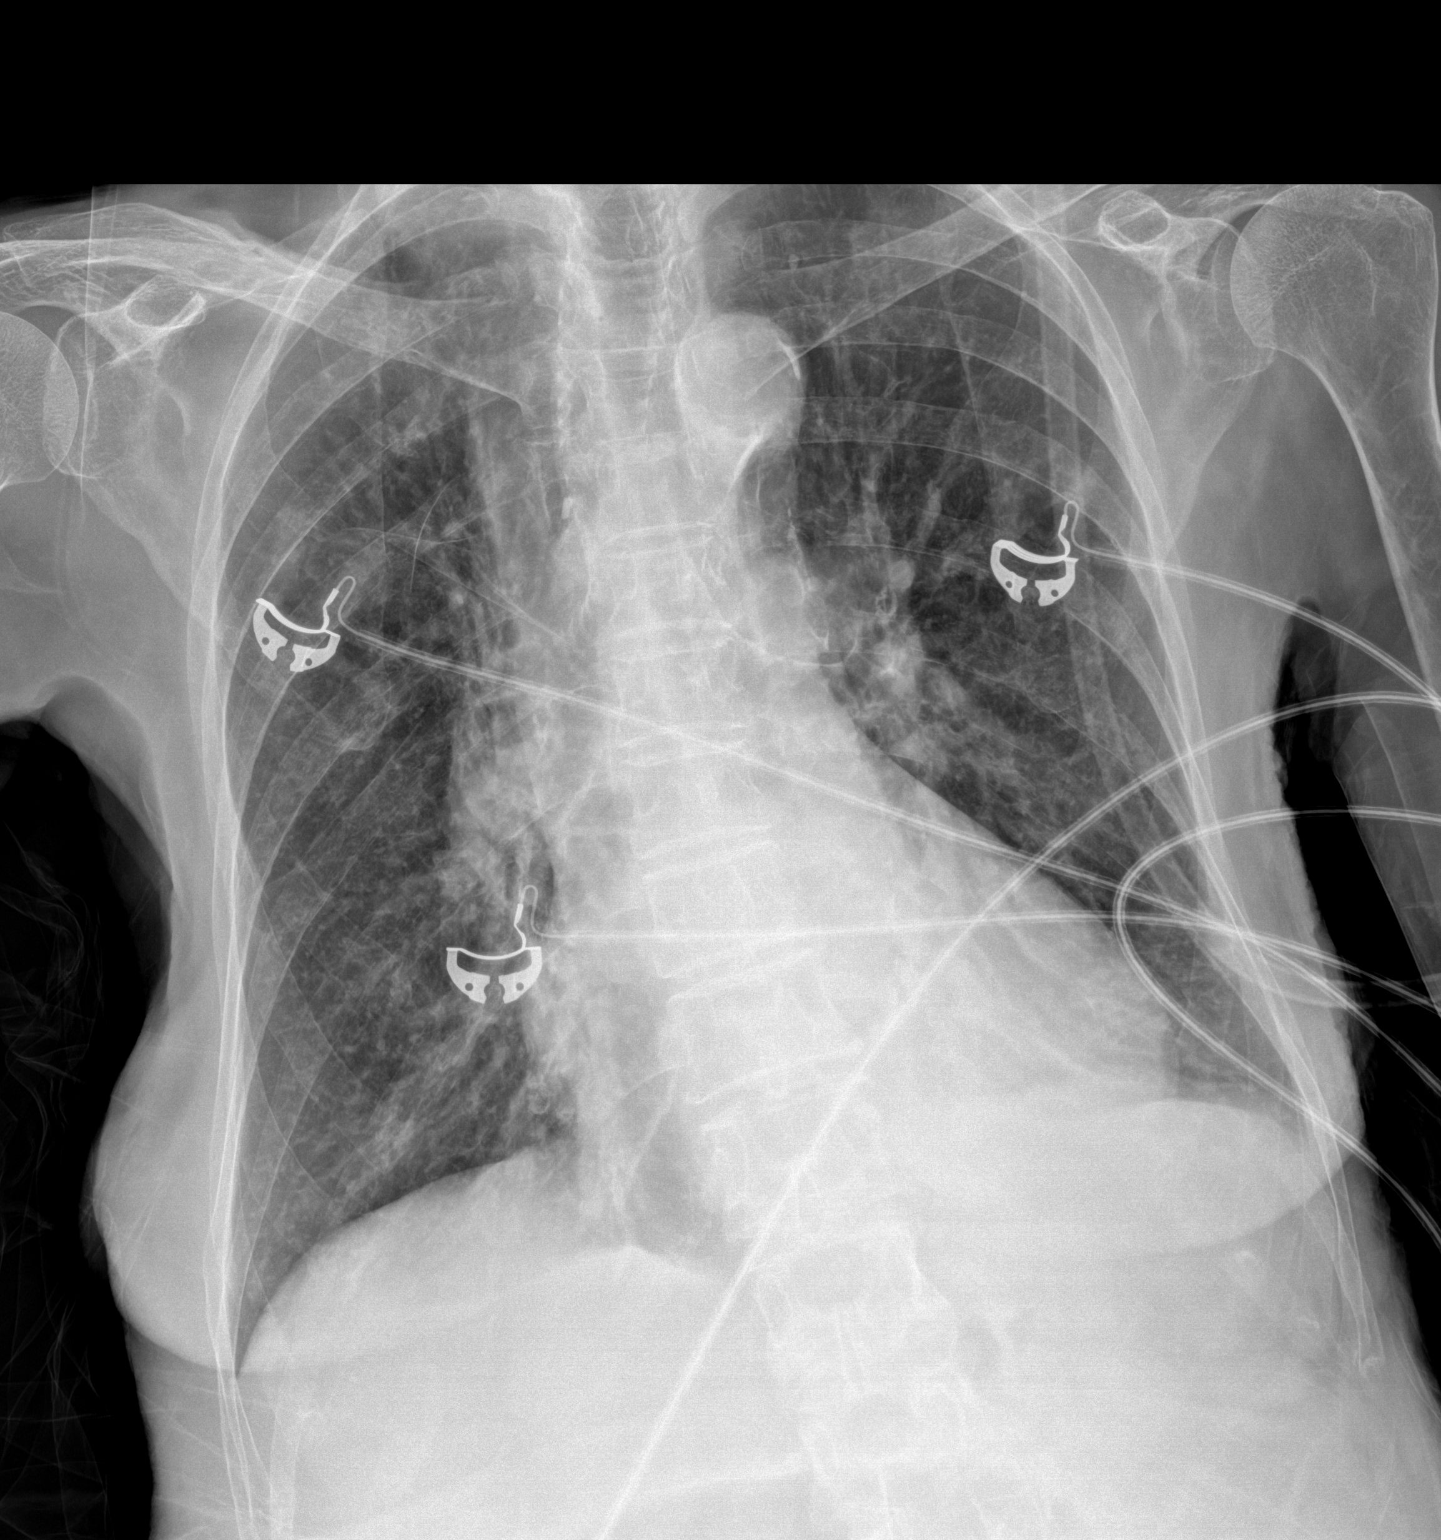

[1 of 1 positions shown; findings below may reference images not displayed]

FINDINGS: Mild diffuse interstitial prominence and nodularity which may
represent edema, although atypical pneumonia is not excluded
clinical correlation is recommended. No consolidative changes. No
large pleural effusion or pneumothorax. Minimal left lung base
atelectasis. Stable mild cardiomegaly. Atherosclerotic calcification
of the aorta. No acute osseous pathology.
IMPRESSION: Mild diffuse interstitial prominence and nodularity may represent
edema. Atypical pneumonia is not excluded.
